# Patient Record
Sex: Female | Born: 1950 | Race: Black or African American | Hispanic: No | Marital: Single | Smoking: Former smoker
Health system: Southern US, Community
[De-identification: ages and names within clinical notes are randomized; demographics above are authoritative.]

## PROBLEM LIST (undated history)

## (undated) DIAGNOSIS — N390 Urinary tract infection, site not specified: Secondary | ICD-10-CM

## (undated) DIAGNOSIS — T7840XA Allergy, unspecified, initial encounter: Secondary | ICD-10-CM

## (undated) DIAGNOSIS — R52 Pain, unspecified: Secondary | ICD-10-CM

## (undated) DIAGNOSIS — R350 Frequency of micturition: Secondary | ICD-10-CM

## (undated) DIAGNOSIS — E785 Hyperlipidemia, unspecified: Secondary | ICD-10-CM

## (undated) DIAGNOSIS — R748 Abnormal levels of other serum enzymes: Secondary | ICD-10-CM

## (undated) DIAGNOSIS — Z0289 Encounter for other administrative examinations: Secondary | ICD-10-CM

## (undated) DIAGNOSIS — R4189 Other symptoms and signs involving cognitive functions and awareness: Secondary | ICD-10-CM

## (undated) DIAGNOSIS — G8929 Other chronic pain: Secondary | ICD-10-CM

## (undated) DIAGNOSIS — E2740 Unspecified adrenocortical insufficiency: Secondary | ICD-10-CM

## (undated) DIAGNOSIS — F319 Bipolar disorder, unspecified: Secondary | ICD-10-CM

## (undated) DIAGNOSIS — M5412 Radiculopathy, cervical region: Secondary | ICD-10-CM

## (undated) DIAGNOSIS — F419 Anxiety disorder, unspecified: Secondary | ICD-10-CM

## (undated) DIAGNOSIS — I10 Essential (primary) hypertension: Secondary | ICD-10-CM

## (undated) DIAGNOSIS — F32A Depression, unspecified: Secondary | ICD-10-CM

## (undated) DIAGNOSIS — M533 Sacrococcygeal disorders, not elsewhere classified: Secondary | ICD-10-CM

## (undated) DIAGNOSIS — K649 Unspecified hemorrhoids: Secondary | ICD-10-CM

## (undated) DIAGNOSIS — E871 Hypo-osmolality and hyponatremia: Secondary | ICD-10-CM

## (undated) DIAGNOSIS — E663 Overweight: Secondary | ICD-10-CM

## (undated) DIAGNOSIS — M545 Low back pain, unspecified: Secondary | ICD-10-CM

## (undated) DIAGNOSIS — G5620 Lesion of ulnar nerve, unspecified upper limb: Secondary | ICD-10-CM

## (undated) DIAGNOSIS — M259 Joint disorder, unspecified: Secondary | ICD-10-CM

## (undated) DIAGNOSIS — K2100 Gastro-esophageal reflux disease with esophagitis, without bleeding: Secondary | ICD-10-CM

## (undated) DIAGNOSIS — M25559 Pain in unspecified hip: Secondary | ICD-10-CM

## (undated) DIAGNOSIS — E039 Hypothyroidism, unspecified: Secondary | ICD-10-CM

## (undated) DIAGNOSIS — M169 Osteoarthritis of hip, unspecified: Secondary | ICD-10-CM

## (undated) DIAGNOSIS — M5416 Radiculopathy, lumbar region: Secondary | ICD-10-CM

## (undated) DIAGNOSIS — F3160 Bipolar disorder, current episode mixed, unspecified: Secondary | ICD-10-CM

## (undated) DIAGNOSIS — Z87442 Personal history of urinary calculi: Secondary | ICD-10-CM

## (undated) DIAGNOSIS — Z96642 Presence of left artificial hip joint: Secondary | ICD-10-CM

## (undated) DIAGNOSIS — M1990 Unspecified osteoarthritis, unspecified site: Secondary | ICD-10-CM

## (undated) DIAGNOSIS — R7303 Prediabetes: Secondary | ICD-10-CM

## (undated) DIAGNOSIS — M5116 Intervertebral disc disorders with radiculopathy, lumbar region: Secondary | ICD-10-CM

## (undated) DIAGNOSIS — K5901 Slow transit constipation: Secondary | ICD-10-CM

## (undated) DIAGNOSIS — B3781 Candidal esophagitis: Secondary | ICD-10-CM

## (undated) DIAGNOSIS — G894 Chronic pain syndrome: Secondary | ICD-10-CM

## (undated) DIAGNOSIS — N952 Postmenopausal atrophic vaginitis: Secondary | ICD-10-CM

## (undated) DIAGNOSIS — F329 Major depressive disorder, single episode, unspecified: Secondary | ICD-10-CM

## (undated) DIAGNOSIS — G723 Periodic paralysis: Secondary | ICD-10-CM

## (undated) DIAGNOSIS — R627 Adult failure to thrive: Secondary | ICD-10-CM

## (undated) DIAGNOSIS — B370 Candidal stomatitis: Secondary | ICD-10-CM

## (undated) DIAGNOSIS — M4800 Spinal stenosis, site unspecified: Secondary | ICD-10-CM

## (undated) DIAGNOSIS — I951 Orthostatic hypotension: Secondary | ICD-10-CM

## (undated) DIAGNOSIS — M7062 Trochanteric bursitis, left hip: Secondary | ICD-10-CM

## (undated) HISTORY — DX: Anxiety disorder, unspecified: F41.9

## (undated) HISTORY — DX: Presence of left artificial hip joint: Z96.642

## (undated) HISTORY — DX: Spinal stenosis, site unspecified: M48.00

## (undated) HISTORY — DX: Frequency of micturition: R35.0

## (undated) HISTORY — DX: Radiculopathy, cervical region: M54.12

## (undated) HISTORY — DX: Hyperlipidemia, unspecified: E78.5

## (undated) HISTORY — DX: Unspecified adrenocortical insufficiency: E27.40

## (undated) HISTORY — DX: Trochanteric bursitis, left hip: M70.62

## (undated) HISTORY — DX: Pain in unspecified hip: M25.559

## (undated) HISTORY — DX: Encounter for other administrative examinations: Z02.89

## (undated) HISTORY — DX: Candidal stomatitis: B37.0

## (undated) HISTORY — DX: Adult failure to thrive: R62.7

## (undated) HISTORY — DX: Lesion of ulnar nerve, unspecified upper limb: G56.20

## (undated) HISTORY — PX: OTHER SURGICAL HISTORY: SHX169

## (undated) HISTORY — DX: Essential (primary) hypertension: I10

## (undated) HISTORY — DX: Bipolar disorder, unspecified: F31.9

## (undated) HISTORY — DX: Depression, unspecified: F32.A

## (undated) HISTORY — DX: Unspecified hemorrhoids: K64.9

## (undated) HISTORY — DX: Postmenopausal atrophic vaginitis: N95.2

## (undated) HISTORY — DX: Periodic paralysis: G72.3

## (undated) HISTORY — DX: Joint disorder, unspecified: M25.9

## (undated) HISTORY — DX: Unspecified osteoarthritis, unspecified site: M19.90

## (undated) HISTORY — DX: Personal history of urinary calculi: Z87.442

## (undated) HISTORY — DX: Orthostatic hypotension: I95.1

## (undated) HISTORY — DX: Urinary tract infection, site not specified: N39.0

## (undated) HISTORY — DX: Gastro-esophageal reflux disease with esophagitis, without bleeding: K21.00

## (undated) HISTORY — DX: Low back pain, unspecified: M54.50

## (undated) HISTORY — DX: Abnormal levels of other serum enzymes: R74.8

## (undated) HISTORY — DX: Hypothyroidism, unspecified: E03.9

## (undated) HISTORY — DX: Osteoarthritis of hip, unspecified: M16.9

## (undated) HISTORY — DX: Other chronic pain: G89.29

## (undated) HISTORY — DX: Radiculopathy, lumbar region: M54.16

## (undated) HISTORY — DX: Overweight: E66.3

## (undated) HISTORY — DX: Candidal esophagitis: B37.81

## (undated) HISTORY — DX: Pain, unspecified: R52

## (undated) HISTORY — DX: Other symptoms and signs involving cognitive functions and awareness: R41.89

## (undated) HISTORY — DX: Allergy, unspecified, initial encounter: T78.40XA

## (undated) HISTORY — DX: Bipolar disorder, current episode mixed, unspecified: F31.60

## (undated) HISTORY — DX: Prediabetes: R73.03

## (undated) HISTORY — DX: Sacrococcygeal disorders, not elsewhere classified: M53.3

## (undated) HISTORY — DX: Slow transit constipation: K59.01

## (undated) HISTORY — DX: Chronic pain syndrome: G89.4

## (undated) HISTORY — DX: Major depressive disorder, single episode, unspecified: F32.9

## (undated) HISTORY — PX: ABDOMINAL HYSTERECTOMY: SHX81

## (undated) HISTORY — DX: Intervertebral disc disorders with radiculopathy, lumbar region: M51.16

## (undated) HISTORY — DX: Hypo-osmolality and hyponatremia: E87.1

## (undated) HISTORY — DX: Low back pain: M54.5

## (undated) HISTORY — DX: Gastro-esophageal reflux disease with esophagitis: K21.0

---

## 2004-11-05 ENCOUNTER — Ambulatory Visit: Payer: Self-pay | Admitting: Family Medicine

## 2005-12-22 ENCOUNTER — Ambulatory Visit: Payer: Self-pay | Admitting: Family Medicine

## 2005-12-29 ENCOUNTER — Ambulatory Visit: Payer: Self-pay | Admitting: Family Medicine

## 2006-01-12 ENCOUNTER — Ambulatory Visit: Payer: Self-pay | Admitting: Gastroenterology

## 2006-07-13 ENCOUNTER — Ambulatory Visit: Payer: Self-pay | Admitting: Family Medicine

## 2006-12-29 ENCOUNTER — Ambulatory Visit: Payer: Self-pay | Admitting: Family Medicine

## 2007-01-20 ENCOUNTER — Ambulatory Visit: Payer: Self-pay | Admitting: Family Medicine

## 2007-07-28 ENCOUNTER — Ambulatory Visit: Payer: Self-pay | Admitting: Family Medicine

## 2008-01-25 ENCOUNTER — Ambulatory Visit: Payer: Self-pay | Admitting: Family Medicine

## 2009-01-25 ENCOUNTER — Ambulatory Visit: Payer: Self-pay | Admitting: Internal Medicine

## 2009-07-03 ENCOUNTER — Ambulatory Visit: Payer: Self-pay | Admitting: Internal Medicine

## 2010-03-01 ENCOUNTER — Ambulatory Visit: Payer: Self-pay | Admitting: Internal Medicine

## 2011-05-07 ENCOUNTER — Ambulatory Visit: Payer: Self-pay | Admitting: Internal Medicine

## 2011-05-09 ENCOUNTER — Ambulatory Visit: Payer: Self-pay | Admitting: Internal Medicine

## 2012-05-10 ENCOUNTER — Ambulatory Visit: Payer: Self-pay | Admitting: Internal Medicine

## 2012-10-13 ENCOUNTER — Ambulatory Visit: Payer: Self-pay | Admitting: Unknown Physician Specialty

## 2013-06-06 DIAGNOSIS — E2740 Unspecified adrenocortical insufficiency: Secondary | ICD-10-CM

## 2013-06-06 DIAGNOSIS — F3160 Bipolar disorder, current episode mixed, unspecified: Secondary | ICD-10-CM

## 2013-06-06 DIAGNOSIS — G723 Periodic paralysis: Secondary | ICD-10-CM | POA: Insufficient documentation

## 2013-06-06 DIAGNOSIS — N390 Urinary tract infection, site not specified: Secondary | ICD-10-CM | POA: Insufficient documentation

## 2013-06-06 DIAGNOSIS — R748 Abnormal levels of other serum enzymes: Secondary | ICD-10-CM | POA: Insufficient documentation

## 2013-06-06 DIAGNOSIS — E871 Hypo-osmolality and hyponatremia: Secondary | ICD-10-CM

## 2013-06-06 HISTORY — DX: Unspecified adrenocortical insufficiency: E27.40

## 2013-06-06 HISTORY — DX: Periodic paralysis: G72.3

## 2013-06-06 HISTORY — DX: Hypo-osmolality and hyponatremia: E87.1

## 2013-06-06 HISTORY — DX: Urinary tract infection, site not specified: N39.0

## 2013-06-06 HISTORY — DX: Bipolar disorder, current episode mixed, unspecified: F31.60

## 2013-07-22 ENCOUNTER — Ambulatory Visit: Payer: Self-pay | Admitting: Internal Medicine

## 2013-11-30 ENCOUNTER — Ambulatory Visit: Payer: Self-pay

## 2014-01-04 ENCOUNTER — Ambulatory Visit: Payer: Self-pay | Admitting: Rheumatology

## 2014-01-23 ENCOUNTER — Ambulatory Visit: Payer: Self-pay | Admitting: Unknown Physician Specialty

## 2014-01-23 LAB — BASIC METABOLIC PANEL
Anion Gap: 5 — ABNORMAL LOW (ref 7–16)
BUN: 9 mg/dL (ref 7–18)
CREATININE: 0.89 mg/dL (ref 0.60–1.30)
Calcium, Total: 9.2 mg/dL (ref 8.5–10.1)
Chloride: 101 mmol/L (ref 98–107)
Co2: 29 mmol/L (ref 21–32)
EGFR (Non-African Amer.): >60
Glucose: 104 mg/dL — ABNORMAL HIGH (ref 65–99)
OSMOLALITY: 269 (ref 275–301)
POTASSIUM: 4.5 mmol/L (ref 3.5–5.1)
SODIUM: 135 mmol/L — AB (ref 136–145)

## 2014-01-23 LAB — HEMOGLOBIN: HGB: 12.4 g/dL (ref 12.0–16.0)

## 2014-01-24 ENCOUNTER — Ambulatory Visit: Payer: Self-pay | Admitting: Orthopedic Surgery

## 2014-02-23 ENCOUNTER — Inpatient Hospital Stay: Payer: Self-pay | Admitting: Internal Medicine

## 2014-02-23 LAB — URINALYSIS, COMPLETE
BACTERIA: NONE SEEN
Bilirubin,UR: NEGATIVE
Blood: NEGATIVE
GLUCOSE, UR: NEGATIVE mg/dL (ref 0–75)
Ketone: NEGATIVE
Leukocyte Esterase: NEGATIVE
NITRITE: NEGATIVE
PROTEIN: NEGATIVE
Ph: 6 (ref 4.5–8.0)
RBC,UR: 1 /HPF (ref 0–5)
Specific Gravity: 1.015 (ref 1.003–1.030)
Squamous Epithelial: NONE SEEN

## 2014-02-23 LAB — BASIC METABOLIC PANEL
Anion Gap: 7 (ref 7–16)
BUN: 4 mg/dL — AB (ref 7–18)
CREATININE: 0.78 mg/dL (ref 0.60–1.30)
Calcium, Total: 10.4 mg/dL — ABNORMAL HIGH (ref 8.5–10.1)
Chloride: 103 mmol/L (ref 98–107)
Co2: 30 mmol/L (ref 21–32)
GLUCOSE: 95 mg/dL (ref 65–99)
Osmolality: 276 (ref 275–301)
Potassium: 4.5 mmol/L (ref 3.5–5.1)
SODIUM: 140 mmol/L (ref 136–145)

## 2014-02-23 LAB — CBC WITH DIFFERENTIAL/PLATELET
Basophil #: 0.1 10*3/uL (ref 0.0–0.1)
Basophil %: 0.8 %
EOS ABS: 0 10*3/uL (ref 0.0–0.7)
Eosinophil %: 0.5 %
HCT: 43.9 % (ref 35.0–47.0)
HGB: 13.9 g/dL (ref 12.0–16.0)
LYMPHS ABS: 1.8 10*3/uL (ref 1.0–3.6)
Lymphocyte %: 17.4 %
MCH: 28.8 pg (ref 26.0–34.0)
MCHC: 31.6 g/dL — AB (ref 32.0–36.0)
MCV: 91 fL (ref 80–100)
Monocyte #: 0.9 x10 3/mm (ref 0.2–0.9)
Monocyte %: 9.1 %
NEUTROS ABS: 7.3 10*3/uL — AB (ref 1.4–6.5)
Neutrophil %: 72.2 %
Platelet: 210 10*3/uL (ref 150–440)
RBC: 4.81 10*6/uL (ref 3.80–5.20)
RDW: 14.9 % — AB (ref 11.5–14.5)
WBC: 10.2 10*3/uL (ref 3.6–11.0)

## 2014-02-23 LAB — LIPID PANEL
Cholesterol: 152 mg/dL (ref 0–200)
HDL: 42 mg/dL (ref 40–60)
LDL CHOLESTEROL, CALC: 76 mg/dL (ref 0–100)
Triglycerides: 169 mg/dL (ref 0–200)
VLDL CHOLESTEROL, CALC: 34 mg/dL (ref 5–40)

## 2014-02-23 LAB — TROPONIN I: Troponin-I: <0.02 ng/mL

## 2014-02-24 DIAGNOSIS — I639 Cerebral infarction, unspecified: Secondary | ICD-10-CM | POA: Diagnosis not present

## 2014-02-24 DIAGNOSIS — I10 Essential (primary) hypertension: Secondary | ICD-10-CM | POA: Diagnosis not present

## 2014-02-24 LAB — BASIC METABOLIC PANEL
Anion Gap: 5 — ABNORMAL LOW (ref 7–16)
BUN: 5 mg/dL — ABNORMAL LOW (ref 7–18)
Calcium, Total: 8.9 mg/dL (ref 8.5–10.1)
Chloride: 107 mmol/L (ref 98–107)
Co2: 30 mmol/L (ref 21–32)
Creatinine: 0.75 mg/dL (ref 0.60–1.30)
EGFR (African American): >60
EGFR (Non-African Amer.): >60
Glucose: 103 mg/dL — ABNORMAL HIGH (ref 65–99)
Osmolality: 281 (ref 275–301)
Potassium: 3.7 mmol/L (ref 3.5–5.1)
Sodium: 142 mmol/L (ref 136–145)

## 2014-02-24 LAB — CBC WITH DIFFERENTIAL/PLATELET
BASOS PCT: 0.4 %
Basophil #: 0 10*3/uL (ref 0.0–0.1)
Eosinophil #: 0.1 10*3/uL (ref 0.0–0.7)
Eosinophil %: 1.1 %
HCT: 38 % (ref 35.0–47.0)
HGB: 12.2 g/dL (ref 12.0–16.0)
LYMPHS ABS: 2.5 10*3/uL (ref 1.0–3.6)
Lymphocyte %: 37.2 %
MCH: 29.2 pg (ref 26.0–34.0)
MCHC: 32 g/dL (ref 32.0–36.0)
MCV: 91 fL (ref 80–100)
Monocyte #: 0.7 x10 3/mm (ref 0.2–0.9)
Monocyte %: 10.3 %
NEUTROS ABS: 3.4 10*3/uL (ref 1.4–6.5)
Neutrophil %: 51 %
Platelet: 194 10*3/uL (ref 150–440)
RBC: 4.17 10*6/uL (ref 3.80–5.20)
RDW: 15 % — AB (ref 11.5–14.5)
WBC: 6.6 10*3/uL (ref 3.6–11.0)

## 2014-05-27 NOTE — Op Note (Signed)
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   PROCEDURE: Left ulnar nerve subcutaneous transposition.   ANESTHESIA: General.   SURGEON: Leitha SchullerMichael J Vallen Calabrese, MD   DESCRIPTION OF PROCEDURE: The patient was brought to the operating room and after adequate anesthesia was obtained, the left arm was prepped and draped in the usual sterile fashion. Sterile tourniquet was utilized. After patient identification and timeout procedures were completed, the tourniquet was raised to 250 mmHg. A curvilinear incision was made centered over the medial epicondyle. The subcutaneous tissue was spread and the cubital tunnel identified. It was opened just proximal to the medial epicondyle and release was carried out distally between the heads of the FCU. Going proximally release was carried out and the intermuscular septum excised for transposition of the nerve. After freeing up the nerve it could be transposed anteriorly. The nerve was noted to be somewhat atrophied and pale in appearance without one focal area of compression. After removing it anteriorly, a fascial flap was elevated off the common flexor origin and this was sutured to the subcutaneous tissue to hold the nerve anteriorly, placed through a range of motion, there did not appear to be any points of compression on the nerve to direct visualization and palpation.   The wound was closed after irrigation with 2-0 Vicryl subcutaneously and 4-0 nylon for the skin. Xeroform, 4 x 4's, Webril and posterior splint was applied followed by an Ace wrap.   The patient tolerated the procedure well and was sent to the recovery room in stable condition.   ESTIMATED BLOOD LOSS: Minimal.   COMPLICATIONS: None.   SPECIMEN: None.   TOURNIQUET TIME:  Was 28 minutes at  250 mmHg ligaments.    ____________________________ Leitha SchullerMichael J. Marvalene Barrett, MD mjm:nt D: 01/24/2014 20:32:07 ET T: 01/24/2014 21:49:43 ET JOB#: [id number]  cc: Leitha SchullerMichael J. Cyrene Gharibian, MD, <Dictator> Leitha SchullerMICHAEL J Kaelani Kendrick MD ELECTRONICALLY SIGNED 01/25/2014 8:17

## 2014-05-29 DIAGNOSIS — M5416 Radiculopathy, lumbar region: Secondary | ICD-10-CM | POA: Insufficient documentation

## 2014-05-29 DIAGNOSIS — M5116 Intervertebral disc disorders with radiculopathy, lumbar region: Secondary | ICD-10-CM

## 2014-05-29 HISTORY — DX: Intervertebral disc disorders with radiculopathy, lumbar region: M51.16

## 2014-06-04 NOTE — Discharge Summary (Signed)
PATIENT NAME:  Kelly Foster, Kelly Foster MR#:  [id number] DATE OF BIRTH:  22-Sep-1950  ADMITTING PHYSICIAN: Enid Baas, MD   PRIMARY CARE PHYSICIAN: Hyman Hopes, MD  CONSULTATIONS IN THE HOSPITAL: None.   DISCHARGING PHYSICIAN:   Enid Baas, MD   DISCHARGE DIAGNOSES: 1.  Transient ischemic attack with left-sided weakness, resolved.  MRI negative. Mild right facial droop persistent.  2.  Bipolar with depression, anxiety.  3.  Hypertension.  4.  Hyperlipidemia.   DISCHARGE HOME MEDICATIONS:  1.  Gabapentin 300 mg p.o. 3 times a day.  2.  Oxybutynin 5 mg p.o. daily.  3.  Triamcinolone ointment to irritated gums 2-3 times a day.  4.  Risperidone 4 mg p.o. daily.  5.  Sertraline 200 mg p.o. daily.  6.  Tylenol 1000 mg p.o. q. 8 hours p.r.n. for pain.  7.  Proctosol hydrocortisone 2.5% rectal cream apply to rectal area 2-4 times a day.  8.  Trazodone 100 mg at bedtime as needed.  9.  Trazodone 50 mg 1-2 tablets at bedtime.  10.  Calcium carbonate 600 mg p.o. b.i.d.  11.  Dulcolax tablet 10 mg daily at bedtime.  12.  Systane 1 drop each eye once a day.  13.  Pataday 0.2% ophthalmic solution 1 drop each eye once a day.  14.  Depakote 1000 mg once a day at bedtime. 15.  Cetirizine 10 mg p.o. daily.  16.  Multivitamin 1 tablet p.o. daily.  17.  Simvastatin 40 mg p.o. daily.  18.  Nystatin oral suspension 5 mL 4 times a day.  19.  Naproxen 500 mg p.o. b.i.d.  20.  Nasonex 50 mcg 2 sprays each nostril once a day.  21.  Lisinopril 10 mg p.o. daily.  22.  Aspirin 325 mg p.o. daily.   DISCHARGE DIET: Low-sodium diet.   DISCHARGE ACTIVITY: As tolerated.   FOLLOWUP INSTRUCTIONS: 1.  PCP followup in 2 weeks.  2.  Home health physical therapy and nursing.  LABORATORY DATA AND IMAGING STUDIES:  Prior to discharge, WBC 6.6, hemoglobin 12.2, hematocrit 38.0, platelet count 194,000. Sodium 142, potassium 3.7, chloride 107, bicarbonate 30, BUN 5, creatinine 0.75, glucose 103, and  calcium of 8.9. LDL cholesterol 76, HDL 42, total cholesterol 045, triglycerides 169. Urinalysis negative for any infection. CT of the head on admission showing no acute intracranial abnormality, mild generalized atrophy noted.   Ultrasound of carotids bilaterally showing no evidence of any hemodynamically significant stenosis. MRA of the brain showing no acute intracranial abnormalities. Mild cerebral atrophy present. Echocardiogram Doppler showing LV ejection fraction of 60%-65%, impaired relaxation pattern.  No source of CVA identified.   BRIEF HOSPITAL COURSE: Kelly Foster is a 64 year old African American female with history of bipolar disorder, hypertension, hyperlipidemia, who presents to the hospital secondary to left-sided weakness, unable to pull herself out of bed and also right-sided facial droop.  1.  Possible acute CVA. Initially admitted for possible acute CVA. She came outside of the TPA window, was admitted to telemetry. Neurologic checks were done. Her weakness has improved significantly. No evidence of any infection, not sure what exactly the cause of her left-sided weakness could have been. She has risk factors for possible TIA. MRI is negative, Dopplers are negative of the carotid arteries and echocardiogram is normal. She still has very minimal  right facial droop, not sure how much of this is chronic.  No evidence of Bell palsy otherwise.  She was not taking antiplatelet therapy at home, so  was started on aspirin. Her statin is being continued. She was evaluated by physical therapy, initially recommended rehabilitation. The patient refused rehabilitation, but she has gotten stronger so wanted to go home, so she is being discharged home with home health at this time.  2.  Blood pressure. Her lisinopril dose has been increased.  3.  Bipolar disorder. All her home medications are being continued without any changes.  4.  Arthritis. She is on pain medications and also gabapentin.  5.  Her  course has been otherwise uneventful in the hospital.   DISCHARGE CONDITION: Stable.   DISCHARGE DISPOSITION: Home with home health.   TIME SPENT ON DISCHARGE: 40 minutes.     ____________________________ Enid Baasadhika Erionna Strum, MD rk:LT D: 02/26/2014 13:34:21 ET T: 02/26/2014 21:02:32 ET JOB#: [id number]  cc: Enid Baasadhika Hiawatha Dressel, MD, <Dictator> Hyman HopesHarriett P. Burns, MD Enid BaasADHIKA Marbeth Smedley MD ELECTRONICALLY SIGNED 02/27/2014 14:00

## 2014-06-04 NOTE — H&P (Signed)
PATIENT NAME:  Kelly Foster, Kelly Foster MR#:  [id number] DATE OF BIRTH:  [birth year]  DATE OF ADMISSION:  02/23/2014  ADMITTING PHYSICIAN: Enid Baas, MD   PRIMARY CARE PHYSICIAN: Myrene Galas, MD  CHIEF COMPLAINT: Weakness.   HISTORY OF PRESENT ILLNESS: Ms. [place]ber[place] is a 64 year old African American female with past medical history significant for hypertension, bipolar disorder, hyperlipidemia and chronic anemia who presents to the hospital secondary to difficulty walking since this morning. The patient said she is usually independent, takes care of herself, was in her normal state of health all day yesterday. This morning she woke up and she could not get out of bed. She could not move and called EMS and presents to the ER. She could not specify what is wrong with her exactly. On exam, it seems like she has minimal weakness on the left side. The patient is a right-handed person and is noted to have obvious minimal right facial droop. Denies any fevers or chills. No chest pain. No headaches. No previous strokes.   PAST MEDICAL HISTORY:  1.  Bipolar with depression and anxiety.  2.  Hypertension.  3.  Hyperlipidemia.  4.  Allergies.  PAST SURGICAL HISTORY:  1.  Hysterectomy.  2.  C-section.   ALLERGIES TO MEDICATIONS: CODEINE AND TRAMADOL.  CURRENT HOME MEDICATIONS:  1.  Triamcinolone ointment to irritated gum for 5 days as needed.  2.  Risperidone 4 mg oral tablet daily.  3.  Sertraline 100 mg 2 tablets daily.  4.  Tylenol 1000 mg every 8 hours as needed for pain.  5.  Diclofenac sodium 50 mg b.i.d. p.r.n. for pain.  6.  Proctosol 2.5% ointment to hemorrhoids 2 to 4 times a day.  7.  Lisinopril 5 mg p.o. daily.  8.  Trazodone 100 mg p.o. at bedtime.  9.  Trazodone 100 mg extra as needed at bedtime.  10.  Calcium 600 mg tablet twice a day.  11.  Dulcolax 5 mg 2 tablets by mouth at bedtime to help with constipation.  12.  Systane ophthalmic solution 1 drop each eye once a day for dry  eyes. 13.  Pataday 0.2% ophthalmic solution 1 drop each eye once a day for allergy symptoms.  14.  Depakote 1000 mg p.o. at bedtime.  15.  Simvastatin 40 mg p.o. daily.  16.  Cetirizine 10 mg p.o. daily.  17.  Multivitamin 1 tablet p.o. daily.   SOCIAL HISTORY: Lives at home by herself. Quit smoking and alcohol for about 20 years. 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   FAMILY HISTORY: Father had a stroke. Sister with colon cancer.   REVIEW OF SYSTEMS: CONSTITUTIONAL: No fever, fatigue.  EYES: No blurred vision, double vision, inflammation or glaucoma. Uses reading glasses.  ENT: No tinnitus, ear pain, hearing loss, epistaxis or discharge. Positive for dysphagia. Unable to swallow liquids and has been coughing since morning.  RESPIRATORY: No cough, wheeze, hemoptysis or COPD. CARDIOVASCULAR: No chest pain, orthopnea, edema, arrhythmia, palpitations, or syncope.  GASTROINTESTINAL: No nausea, vomiting, diarrhea, abdominal pain, hematemesis, or melena.  GENITOURINARY: No dysuria, hematuria, renal calculus, frequency, or incontinence.  ENDOCRINE: No polyuria, nocturia, thyroid problems, heat or cold intolerance.  HEMATOLOGY: No anemia, easy bruising or bleeding.  SKIN: No acne, rash or lesions.  MUSCULOSKELETAL: Positive for left-sided weakness, generalized weakness.  NEUROLOGIC: No previous CVA, TIA or seizures.  PSYCHOLOGIC: Positive for history of bipolar disorder. No active anxiety or insomnia.   PHYSICAL  EXAMINATION: VITAL SIGNS: Temperature 98.2 degrees Fahrenheit, pulse 104, respirations 18, blood pressure 113/69, pulse ox 94% on room air.  GENERAL: Well-built, well-nourished female lying in bed, not in any acute distress.  HEENT: Normocephalic, atraumatic. Pupils equal, round and reacting to light. Anicteric sclerae. Extraocular movements intact. Oropharynx clear without erythema, mass, or exudates. Mild right facial droop, especially at the  mouth region noted and there is erythematous patch about the right upper lip until starting right at the mid left upper lip to the lateral end of the lip.  NECK: Supple. No thyromegaly, JVD or carotid bruit. No lymphadenopathy.  LUNGS: Moving air bilaterally. No wheeze or crackles. No use of accessory muscles for breathing.  CARDIOVASCULAR: S1 and S2, regular rate and rhythm. No murmurs, rubs, or gallops.  ABDOMEN: Soft, nontender, nondistended. No hepatosplenomegaly. Normal bowel sounds.  EXTREMITIES: No pedal edema. No clubbing. No cyanosis. 2+ dorsalis pedis pulses palpable bilaterally.  SKIN: No acne, rash or lesions.  LYMPHATICS: No cervical lymphadenopathy.  NEUROLOGIC: Small mouth droop noted in the right side of the face. Otherwise, cranial nerves intact. Strength is 5/5 in both right upper and lower extremities, on the left side 4/5 left upper extremity and 4/5 left lower extremity with some drift noted. Unstable cerebellar function test on the right side, especially the finger-nose test. Gait cannot be tested as the patient feels unsafe on sitting up at this time and cannot pull herself up.  PSYCHOLOGIC: The patient is awake, alert, oriented x3.   DIAGNOSTIC DATA: WBC 10.2, hemoglobin 13.9, hematocrit 43.9, platelet count 210,000.   Sodium 140, potassium 4.5, chloride 103, bicarb 30, BUN 4, creatinine 0.78, glucose 95, and calcium 10.4. Troponin is negative. Urinalysis is negative for any infection.   CT of the head showing atrophy, mild, and no acute intracranial abnormalities.  EKG showing sinus tachycardia, heart rate of 101. No acute ST-T wave abnormalities.   ASSESSMENT AND PLAN: A 64 year old female with hypertension, hyperlipidemia, bipolar disorder admitted for weakness. CT is negative for any acute findings.  1.  Possible acute cerebrovascular accident with right facial droop, left-sided weakness. The symptoms started this morning. However, past the TPA window with her symptoms  noticed at 7:00 a.m. Admit, get MRI, carotid Dopplers, echocardiogram, start aspirin, continue statin and neuro checks have been ordered. Since the patient is complaining of dysphagia, will also get a swallow evaluation.  PT and OT consult and care management consult.  2.  Hypertension. Permissive high blood pressure. Blood pressure is anyways on the lower side. Hold her home p.o. medicines and IV hydralazine p.r.n.  3.  Bipolar. Continue home medications.  4.  Deep vein thrombosis prophylaxis.  CODE STATUS: FULL code.   TIME SPENT ON ADMISSION: 50 minutes.  ____________________________ Enid Baasadhika Dawana Asper, MD rk:sb D: 02/23/2014 15:14:29 ET T: 02/23/2014 15:29:30 ET JOB#: [id number]  cc: Enid Baasadhika Micheline Markes, MD, <Dictator> Hyman HopesHarriett P. Burns, MD Enid BaasADHIKA Laylaa Guevarra MD ELECTRONICALLY SIGNED 02/26/2014 14:12

## 2014-06-29 ENCOUNTER — Ambulatory Visit: Payer: Medicare Other | Attending: Pain Medicine | Admitting: Pain Medicine

## 2014-06-29 ENCOUNTER — Encounter: Payer: Self-pay | Admitting: Pain Medicine

## 2014-06-29 VITALS — BP 133/53 | HR 79 | Temp 98.1°F | Resp 16 | Ht 67.0 in | Wt 214.0 lb

## 2014-06-29 DIAGNOSIS — G90512 Complex regional pain syndrome I of left upper limb: Secondary | ICD-10-CM

## 2014-06-29 DIAGNOSIS — M792 Neuralgia and neuritis, unspecified: Secondary | ICD-10-CM

## 2014-06-29 DIAGNOSIS — M5030 Other cervical disc degeneration, unspecified cervical region: Secondary | ICD-10-CM

## 2014-06-29 NOTE — Progress Notes (Signed)
Discharged to home ambulatory at 1346.  Pre procedure instructions given with teach back 3 done.

## 2014-06-29 NOTE — Patient Instructions (Addendum)
GENERAL RISKS AND COMPLICATIONS  What are the risk, side effects and possible complications? Generally speaking, most procedures are safe.  However, with any procedure there are risks, side effects, and the possibility of complications.  The risks and complications are dependent upon the sites that are lesioned, or the type of nerve block to be performed.  The closer the procedure is to the spine, the more serious the risks are.  Great care is taken when placing the radio frequency needles, block needles or lesioning probes, but sometimes complications can occur. 1. Infection: Any time there is an injection through the skin, there is a risk of infection.  This is why sterile conditions are used for these blocks.  There are four possible types of infection. 1. Localized skin infection. 2. Central Nervous System Infection-This can be in the form of Meningitis, which can be deadly. 3. Epidural Infections-This can be in the form of an epidural abscess, which can cause pressure inside of the spine, causing compression of the spinal cord with subsequent paralysis. This would require an emergency surgery to decompress, and there are no guarantees that the patient would recover from the paralysis. 4. Discitis-This is an infection of the intervertebral discs.  It occurs in about 1% of discography procedures.  It is difficult to treat and it may lead to surgery.        2. Pain: the needles have to go through skin and soft tissues, will cause soreness.       3. Damage to internal structures:  The nerves to be lesioned may be near blood vessels or    other nerves which can be potentially damaged.       4. Bleeding: Bleeding is more common if the patient is taking blood thinners such as  aspirin, Coumadin, Ticiid, Plavix, etc., or if he/she have some genetic predisposition  such as hemophilia. Bleeding into the spinal canal can cause compression of the spinal  cord with subsequent paralysis.  This would require an  emergency surgery to  decompress and there are no guarantees that the patient would recover from the  paralysis.       5. Pneumothorax:  Puncturing of a lung is a possibility, every time a needle is introduced in  the area of the chest or upper back.  Pneumothorax refers to free air around the  collapsed lung(s), inside of the thoracic cavity (chest cavity).  Another two possible  complications related to a similar event would include: Hemothorax and Chylothorax.   These are variations of the Pneumothorax, where instead of air around the collapsed  lung(s), you may have blood or chyle, respectively.       6. Spinal headaches: They may occur with any procedures in the area of the spine.       7. Persistent CSF (Cerebro-Spinal Fluid) leakage: This is a rare problem, but may occur  with prolonged intrathecal or epidural catheters either due to the formation of a fistulous  track or a dural tear.       8. Nerve damage: By working so close to the spinal cord, there is always a possibility of  nerve damage, which could be as serious as a permanent spinal cord injury with  paralysis.       9. Death:  Although rare, severe deadly allergic reactions known as "Anaphylactic  reaction" can occur to any of the medications used.      10. Worsening of the symptoms:  We can always make thing worse.    What are the chances of something like this happening? Chances of any of this occuring are extremely low.  By statistics, you have more of a chance of getting killed in a motor vehicle accident: while driving to the hospital than any of the above occurring .  Nevertheless, you should be aware that they are possibilities.  In general, it is similar to taking a shower.  Everybody knows that you can slip, hit your head and get killed.  Does that mean that you should not shower again?  Nevertheless always keep in mind that statistics do not mean anything if you happen to be on the wrong side of them.  Even if a procedure has a 1  (one) in a 1,000,000 (million) chance of going wrong, it you happen to be that one..Also, keep in mind that by statistics, you have more of a chance of having something go wrong when taking medications.  Who should not have this procedure? If you are on a blood thinning medication (e.g. Coumadin, Plavix, see list of "Blood Thinners"), or if you have an active infection going on, you should not have the procedure.  If you are taking any blood thinners, please inform your physician.  How should I prepare for this procedure?  Do not eat or drink anything at least six hours prior to the procedure.  Bring a driver with you .  It cannot be a taxi.  Come accompanied by an adult that can drive you back, and that is strong enough to help you if your legs get weak or numb from the local anesthetic.  Take all of your medicines the morning of the procedure with just enough water to swallow them.  If you have diabetes, make sure that you are scheduled to have your procedure done first thing in the morning, whenever possible.  If you have diabetes, take only half of your insulin dose and notify our nurse that you have done so as soon as you arrive at the clinic.  If you are diabetic, but only take blood sugar pills (oral hypoglycemic), then do not take them on the morning of your procedure.  You may take them after you have had the procedure.  Do not take aspirin or any aspirin-containing medications, at least eleven (11) days prior to the procedure.  They may prolong bleeding.  Wear loose fitting clothing that may be easy to take off and that you would not mind if it got stained with Betadine or blood.  Do not wear any jewelry or perfume  Remove any nail coloring.  It will interfere with some of our monitoring equipment.  NOTE: Remember that this is not meant to be interpreted as a complete list of all possible complications.  Unforeseen problems may occur.  BLOOD THINNERS The following drugs  contain aspirin or other products, which can cause increased bleeding during surgery and should not be taken for 2 weeks prior to and 1 week after surgery.  If you should need take something for relief of minor pain, you may take acetaminophen which is found in Tylenol,m Datril, Anacin-3 and Panadol. It is not blood thinner. The products listed below are.  Do not take any of the products listed below in addition to any listed on your instruction sheet.  A.P.C or A.P.C with Codeine Codeine Phosphate Capsules #3 Ibuprofen Ridaura  ABC compound Congesprin Imuran rimadil  Advil Cope Indocin Robaxisal  Alka-Seltzer Effervescent Pain Reliever and Antacid Coricidin or Coricidin-D  Indomethacin Rufen    Alka-Seltzer plus Cold Medicine Cosprin Ketoprofen S-A-C Tablets  Anacin Analgesic Tablets or Capsules Coumadin Korlgesic Salflex  Anacin Extra Strength Analgesic tablets or capsules CP-2 Tablets Lanoril Salicylate  Anaprox Cuprimine Capsules Levenox Salocol  Anexsia-D Dalteparin Magan Salsalate  Anodynos Darvon compound Magnesium Salicylate Sine-off  Ansaid Dasin Capsules Magsal Sodium Salicylate  Anturane Depen Capsules Marnal Soma  APF Arthritis pain formula Dewitt's Pills Measurin Stanback  Argesic Dia-Gesic Meclofenamic Sulfinpyrazone  Arthritis Bayer Timed Release Aspirin Diclofenac Meclomen Sulindac  Arthritis pain formula Anacin Dicumarol Medipren Supac  Analgesic (Safety coated) Arthralgen Diffunasal Mefanamic Suprofen  Arthritis Strength Bufferin Dihydrocodeine Mepro Compound Suprol  Arthropan liquid Dopirydamole Methcarbomol with Aspirin Synalgos  ASA tablets/Enseals Disalcid Micrainin Tagament  Ascriptin Doan's Midol Talwin  Ascriptin A/D Dolene Mobidin Tanderil  Ascriptin Extra Strength Dolobid Moblgesic Ticlid  Ascriptin with Codeine Doloprin or Doloprin with Codeine Momentum Tolectin  Asperbuf Duoprin Mono-gesic Trendar  Aspergum Duradyne Motrin or Motrin IB Triminicin  Aspirin  plain, buffered or enteric coated Durasal Myochrisine Trigesic  Aspirin Suppositories Easprin Nalfon Trillsate  Aspirin with Codeine Ecotrin Regular or Extra Strength Naprosyn Uracel  Atromid-S Efficin Naproxen Ursinus  Auranofin Capsules Elmiron Neocylate Vanquish  Axotal Emagrin Norgesic Verin  Azathioprine Empirin or Empirin with Codeine Normiflo Vitamin E  Azolid Emprazil Nuprin Voltaren  Bayer Aspirin plain, buffered or children's or timed BC Tablets or powders Encaprin Orgaran Warfarin Sodium  Buff-a-Comp Enoxaparin Orudis Zorpin  Buff-a-Comp with Codeine Equegesic Os-Cal-Gesic   Buffaprin Excedrin plain, buffered or Extra Strength Oxalid   Bufferin Arthritis Strength Feldene Oxphenbutazone   Bufferin plain or Extra Strength Feldene Capsules Oxycodone with Aspirin   Bufferin with Codeine Fenoprofen Fenoprofen Pabalate or Pabalate-SF   Buffets II Flogesic Panagesic   Buffinol plain or Extra Strength Florinal or Florinal with Codeine Panwarfarin   Buf-Tabs Flurbiprofen Penicillamine   Butalbital Compound Four-way cold tablets Penicillin   Butazolidin Fragmin Pepto-Bismol   Carbenicillin Geminisyn Percodan   Carna Arthritis Reliever Geopen Persantine   Carprofen Gold's salt Persistin   Chloramphenicol Goody's Phenylbutazone   Chloromycetin Haltrain Piroxlcam   Clmetidine heparin Plaquenil   Cllnoril Hyco-pap Ponstel   Clofibrate Hydroxy chloroquine Propoxyphen         Before stopping any of these medications, be sure to consult the physician who ordered them.  Some, such as Coumadin (Warfarin) are ordered to prevent or treat serious conditions such as "deep thrombosis", "pumonary embolisms", and other heart problems.  The amount of time that you may need off of the medication may also vary with the medication and the reason for which you were taking it.  If you are taking any of these medications, please make sure you notify your pain physician before you undergo any  procedures.         Stellate Ganglion Block Patient Information  Description: The stellate ganglion is part of a chain of nerves in the neck that innervate the shoulder, arm, and hand.  This chain of nerves lies beside the windpipe.  By injecting local anesthesia along the stellate ganglion, we block the nerve innervation to the arm and hand on that particular side.  Generally only sensory innervation (touch) is blocked and not motor innervation (strength).  This allows pain-free physical therapy, which is vital to many of the conditions that this nerve block helps.   After numbing the skin with local anesthesia, a small needle is passed to the stellate ganglion and the local anesthesia is again deposited.  The procedure generally takes less than one  minute.  Conditions that my be helped with stellate ganglion blockade;   Reflex sympathetic dystrophy (RSD)  Postherpetic neuralgia  Diabetic neuropathy  Pain from nerve lesions or injury  Herpes zoster ( shingles  Vascular disease  Raynaud's disease  Preparation for the injection: 1. Do not eat any solid food or dairy products within 6 hours of your appointment. 2. You may drink clear liquids up to 2 hours before appointment.  Clear liquids include water, black coffee, juice or soda.  No milk or cream please. 3. You may take your regular medication, including pain medications, with a sip of water before you appointment.  Diabetics should hold regular insulin (if taken separately) and take 1/2 normal NPH dose the morning of the procedure.  Carry some sugar containing items with you to your appointment. 4. A drive must accompany you and be prepared to drive you home after your procedure. 5. Bring all your current medications with you. 6. An IV may be inserted and sedation may be given at the discretion of the physician. 7. A blood pressure cuff, EKG and other monitors will often be applied during the procedure.  Some patients may  need to have extra oxygen administered for a short period. 8. You will be asked to provide medical information, including your allergies, prior to the procedure.  We must know immediately if you are taking blood thinners (like Coumadin) or if you are allergic to IV iodine contrast (dye).  Possible side-effects   Bleeding from needle site  Blurry vision and droopy eyelid in that eye (temporary)  Stuffy nose  Temporary shortness of breath  Weakness in arm (temporary)  Arm may feel warm   Possible complications: All of these are extremely RARE   Seizures  Infection  Lung puncture  Bleeding in the neck  Nerve injury  Call if you experience:   Fever/chills  Extreme shortness of breath or inability to swallow  Redness, inflammation or drainage from the site  Weakness or numbness that last for greater than 24 hours  Please note:  If effective, we may have to do up to 3-5 blocks in a 3-4 week interval with physical therapy (depending on which condition you are being treated for).  After the procedure: You will be observed in the outpatient area and discharged home.  Please consult the discharge instructions given after the procedure for any post-procedure questions.  If you have any questions please call [phone] [facility]    Continue present medications. We may consider Metanx  Stellate ganglion block on 07/17/2014  F/U PCP for evaliation of  BP and general medical  condition.  F/U surgical evaluation.  F/U neurological evaluation.  May consider radiofrequency rhizolysis or intraspinal procedures pending response to present treatment and F/U evaluation.  Patient to call Pain Management Center should patient have concerns prior to scheduled return appointment.

## 2014-06-29 NOTE — Progress Notes (Signed)
Safety precautions to be maintained throughout the outpatient stay will include: orient to surroundings, keep bed in low position, maintain call bell within reach at all times, provide assistance with transfer out of bed and ambulation.  

## 2014-06-29 NOTE — Progress Notes (Deleted)
   Subjective:    Patient ID: Kelly Foster, female    DOB: 06/21/1950, 64 y.o.   MRN: [id number]  HPI    Review of Systems     Objective:   Physical Exam        Assessment & Plan:

## 2014-06-29 NOTE — Progress Notes (Signed)
 Subjective:    Patient ID: Kelly Foster, female    DOB: 11/22/1950, 63 y.o.   MRN: [id number]  HPI   patient is 63-year-old female who comes to pain management  At the request of Dr. Michael Manns for further evaluation and treatment of pain involving the left upper extremity. Patient is status post surgery of the left upper extremity for repositioning of the nerve according to patient patient states that she fell at [employer] in June 2015 the patient was later found to be somewhat disoriented and was taken to [facility] for evaluation and treatment per patient was diagnosed with urinary tract infection. Patient had mental status for approximately 3 days patient states that she awakened to have pain involving the region of the left upper extremity. States she underwent cervical MRI as well as nerve studies of the upper extremity and was found to have nerve entrapment. Subsequently the patient underwent surgery of the left upper extremity. Patient states that she has burning stinging sensation of the upper extremity which is aching and annoying constant sensation patient states that she is without known factors which have alleviated the pain is the pain discussed and all the time without any particular precipitating factors The  Pain is aggravated by touch and any type of motion we discussed patient's condition and will consider patient for interventional treatment at time return appointment in attempt to decrease severely disabling pain of the upper extremity. We will consider patient for stellate ganglion block at time of return appointment. The patient was understanding and in agreement with suggested treatment plan.    Review of Systems   Cardiovascular  igh blood pressure   Pulmonary    Unremarkable   Neurological  Unremarkable    psychological  Unremarkable   Gastrointestinal  Unremarkable   Genitourinary  Unremarkable    Hematological  Unremarkable   Endocrine  Unremarkable   Rheumatological   Osteoarthritis   Musculoskeletal   Unremarkable   Of the significant history  Unremarkable       Objective:   Physical Exam  tenderness to palpation of the splenius capitis and occipitalis musculature region of mild to moderate degree. No masses of the head and neck were noted. Mild to moderate tenderness of the acromioclavicular glenohumeral joint region. Decreased grip strength on the left compared to the right. No surgical scar of the left upper extremity forearm  with allodynia noted. Unremarkable Spurling's maneuver was noted. Patient over the thoracic facet thoracic paraspinal musculature region reproduced moderate discomfort in the upper thoracic region. No crepitus of the thoracic region noted Palpation of the lumbar paraspinal musculature region lumbar facet region was a tends to palpation of mild degree mild tenderness over the PSIS and PII S regions. Mild tenderness of the gluteal and piriformis musculature region was noted. Raising tolerates approximately 30 without increased pain with dorsiflexion noted. Negative clonus negative Homans. Mild tennis of the greater trochanteric region and iliotibial band region. Abdomen nontender with no costovertebral angle tenderness noted.      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 May consider Metanx    Stellate ganglion block  To  be performedat time of return appointment  F/U PCP for evaliation of  BP and general medical  condition.  F/U surgical evaluation.    F/U neurological evaluation.  May consider radiofrequency rhizolysis or intraspinal procedures pending response to present treatment and F/U evaluation.  Patient to call Pain Management Center should patient have concerns  prior to scheduled return appointment.  

## 2014-07-17 ENCOUNTER — Ambulatory Visit: Payer: Medicare Other | Admitting: Pain Medicine

## 2014-07-24 ENCOUNTER — Encounter: Payer: Self-pay | Admitting: *Deleted

## 2014-07-25 ENCOUNTER — Ambulatory Visit (INDEPENDENT_AMBULATORY_CARE_PROVIDER_SITE_OTHER): Payer: Medicare Other | Admitting: Urology

## 2014-07-25 ENCOUNTER — Encounter: Payer: Self-pay | Admitting: Urology

## 2014-07-25 VITALS — BP 132/51 | HR 76 | Ht 67.0 in | Wt 216.1 lb

## 2014-07-25 DIAGNOSIS — R350 Frequency of micturition: Secondary | ICD-10-CM

## 2014-07-25 LAB — BLADDER SCAN AMB NON-IMAGING: Scan Result: 16

## 2014-07-25 MED ORDER — OXYBUTYNIN CHLORIDE ER 5 MG PO TB24: 5.0000 mg | ORAL_TABLET | Freq: Every day | ORAL | Status: DC

## 2014-07-25 NOTE — Progress Notes (Signed)
07/25/2014 2:56 PM   Kelly Foster Aug 31, 1950 [id number]  Referring provider: No referring provider defined for this encounter.  Chief Complaint  Patient presents with  . Urinary Frequency    3 month follow up    HPI: [name] is a pleasant 64 year old AA female who is currently on Oxybutynin ER 5mg  q day for her urinary frequency.  She is still reporting good results.  Prior to the oxybutynin, she was voiding up wards of every 14 minutes during the day and 1 to 4 times at night.  She was also having urge incontinence.  Now, she is voiding 4 times during the day and once at night.    She denies any dry mouth, constipation, cognitive impairment or the inability to urinate with the oxybutynin.  She also does not report any recent fevers, chills, nausea, vomiting, gross hematuria, suprapubic pain or urinary tract infections.  Her PVR today is 16 mL.    She had been on Vesicare initially without any improvement in her symptoms.    PMH: Past Medical History  Diagnosis Date  . Allergy   . Anxiety   . Hypertension   . Bipolar 1 disorder   . Manic depressive disorder   . Hyperlipidemia   . DJD (degenerative joint disease)   . Hip pain   . Prediabetes   . Arthritis   . Ulnar neuropathy   . Urinary frequency   . Overweight   . Vaginal atrophy     Surgical History: Past Surgical History  Procedure Laterality Date  . Arm surgery Left   . Abdominal hysterectomy    . Cesarean section      Home Medications:    Medication List       This list is accurate as of: 07/25/14  2:56 PM.  Always use your most recent med list.               acetaminophen 325 MG tablet  Commonly known as:  TYLENOL  Take 650 mg by mouth.     albuterol 108 (90 BASE) MCG/ACT inhaler  Commonly known as:  PROVENTIL HFA;VENTOLIN HFA  Inhale into the lungs.     Calcium-Vitamin D 600-200 MG-UNIT per tablet  Take by mouth.     Cetirizine HCl 10 MG Caps  Take by mouth.     diclofenac 50  MG tablet  Commonly known as:  CATAFLAM  Take 50 mg by mouth.     divalproex 500 MG DR tablet  Commonly known as:  DEPAKOTE  Take 500 mg by mouth at bedtime. Takes 2 tablets at night     gabapentin 300 MG capsule  Commonly known as:  NEURONTIN  Take by mouth.     HYDROcodone-acetaminophen 5-325 MG per tablet  Commonly known as:  NORCO/VICODIN  1 po bid prn     lisinopril 5 MG tablet  Commonly known as:  PRINIVIL,ZESTRIL  Take 5 mg by mouth daily.     loratadine 10 MG tablet  Commonly known as:  CLARITIN  Take 10 mg by mouth daily.     mometasone 50 MCG/ACT nasal spray  Commonly known as:  NASONEX  2 sprays by Each Nare route daily.     MULTI-VITAMINS Tabs  Take by mouth.     nystatin 100000 UNITS vaginal tablet  Place vaginally.     oxybutynin 5 MG 24 hr tablet  Commonly known as:  DITROPAN-XL  Take 5 mg by mouth at bedtime.  Polyethyl Glycol-Propyl Glycol 0.4-0.3 % Soln  Apply to eye.     RISPERDAL PO  Take by mouth at bedtime.     sertraline 100 MG tablet  Commonly known as:  ZOLOFT  Take 200 mg by mouth.     simvastatin 40 MG tablet  Commonly known as:  ZOCOR  Take 40 mg by mouth daily.     STIMULANT LAXATIVE 5 MG EC tablet  Generic drug:  bisacodyl  Take by mouth.     STOOL SOFTENER 100 MG capsule  Generic drug:  docusate sodium  Take 100 mg by mouth.        Allergies:  Allergies  Allergen Reactions  . Codeine Itching  . Tramadol Itching    Family History: Family History  Problem Relation Age of Onset  . Stroke Father   . Colon cancer Sister   . Alzheimer's disease Mother   . Kidney disease Neg Hx     Social History:  reports that she quit smoking about 19 years ago. Her smoking use included Cigarettes. She smoked 1.00 pack per day. She has never used smokeless tobacco. She reports that she does not drink alcohol or use illicit drugs.  ROS: Urological Symptom Review  Patient is experiencing the following symptoms: Frequent  urination Get up at night to urinate   Review of Systems  Gastrointestinal (upper)  : Negative for upper GI symptoms  Gastrointestinal (lower) : Negative for lower GI symptoms  Constitutional : Negative for symptoms  Skin: Negative for skin symptoms  Eyes: Negative for eye symptoms  Ear/Nose/Throat : Sinus problems  Hematologic/Lymphatic: Negative for Hematologic/Lymphatic symptoms  Cardiovascular : Negative for cardiovascular symptoms  Respiratory : Negative for respiratory symptoms  Endocrine: Negative for endocrine symptoms  Musculoskeletal: Joint pain  Neurological: Negative for neurological symptoms  Psychologic: Negative for psychiatric symptoms   Physical Exam: Ht  (1.702 m)  Wt 216 lb 1.6 oz (98.022 kg)  BMI 33.84 kg/m2   Laboratory Data: Results for orders placed or performed in visit on 07/25/14  BLADDER SCAN AMB NON-IMAGING  Result Value Ref Range   Scan Result 16    Lab Results  Component Value Date   WBC 6.6 02/24/2014   HGB 12.2 02/24/2014   HCT 38.0 02/24/2014   MCV 91 02/24/2014   PLT 194 02/24/2014    Lab Results  Component Value Date   CREATININE 0.75 02/24/2014    No results found for: PSA  No results found for: TESTOSTERONE  No results found for: HGBA1C  Urinalysis No results found for: COLORURINE, APPEARANCEUR, LABSPEC, PHURINE, GLUCOSEU, HGBUR, BILIRUBINUR, KETONESUR, PROTEINUR, UROBILINOGEN, NITRITE, LEUKOCYTESUR  Pertinent Imaging: Results for TAJANAY, HURLEY (MRN [id number]) as of 07/25/2014 14:55  Ref. Range 07/25/2014 14:48  Scan Result Unknown 16   Assessment & Plan:   1. Urinary frequency:  Patient's symptoms are well-controlled on oxybutynin ER 5 mg daily.  PVR is minimal.  She is not experiencing any untoward side effects, such as: dry mouth, constipation, inability to urinate or cognitive dysfunction.  She will continue the medication.  Refills are sent to her pharmacy.  She will RTC in one  year for PVR and symptoms recheck.   - BLADDER SCAN AMB NON-IMAGING   No Follow-up on file.  Michiel Cowboy, PA-C  [facility] [address], [address] [place], [place] [address] [phone]

## 2014-08-22 ENCOUNTER — Ambulatory Visit: Payer: Medicare Other | Admitting: Pain Medicine

## 2014-09-15 ENCOUNTER — Other Ambulatory Visit: Payer: Self-pay | Admitting: Internal Medicine

## 2014-09-15 DIAGNOSIS — Z1231 Encounter for screening mammogram for malignant neoplasm of breast: Secondary | ICD-10-CM

## 2014-09-19 ENCOUNTER — Ambulatory Visit: Payer: Medicare Other | Admitting: Urology

## 2014-09-19 ENCOUNTER — Other Ambulatory Visit: Payer: Self-pay | Admitting: Obstetrics and Gynecology

## 2014-09-19 ENCOUNTER — Telehealth: Payer: Self-pay | Admitting: *Deleted

## 2014-09-19 NOTE — Telephone Encounter (Signed)
Called patient per shannon to see why she was on schedule for 09/19/14. Patient should recheck in one year since last ov. Patient stated she needs refill on her Oxybutynin 5 mg.  Let patient know we would give her a refill and she didn't need to come to the appt. Upon review of medication, Carollee Herter had just filled med in June 2016 for 90 tablets with three refills. I called patient back and left a message that she should have refills left to call her pharmacy and have it refilled. If she has any problems contact the office. [facility] [address]

## 2014-09-26 ENCOUNTER — Ambulatory Visit: Payer: Medicare Other

## 2014-10-02 ENCOUNTER — Ambulatory Visit
Admission: RE | Admit: 2014-10-02 | Discharge: 2014-10-02 | Disposition: A | Discharge status: Home / Self Care | Payer: Medicare Other | Source: Ambulatory Visit | Attending: Internal Medicine | Admitting: Internal Medicine

## 2014-10-02 ENCOUNTER — Other Ambulatory Visit: Payer: Self-pay | Admitting: Internal Medicine

## 2014-10-02 DIAGNOSIS — Z1231 Encounter for screening mammogram for malignant neoplasm of breast: Secondary | ICD-10-CM | POA: Diagnosis present

## 2014-10-20 ENCOUNTER — Emergency Department
Admission: EM | Admit: 2014-10-20 | Discharge: 2014-10-20 | Disposition: A | Discharge status: Home / Self Care | Payer: Medicare Other | Attending: Emergency Medicine | Admitting: Emergency Medicine

## 2014-10-20 ENCOUNTER — Encounter: Payer: Self-pay | Admitting: *Deleted

## 2014-10-20 DIAGNOSIS — M25552 Pain in left hip: Secondary | ICD-10-CM | POA: Diagnosis not present

## 2014-10-20 DIAGNOSIS — G8929 Other chronic pain: Secondary | ICD-10-CM | POA: Diagnosis not present

## 2014-10-20 DIAGNOSIS — Z87891 Personal history of nicotine dependence: Secondary | ICD-10-CM | POA: Insufficient documentation

## 2014-10-20 DIAGNOSIS — Z79899 Other long term (current) drug therapy: Secondary | ICD-10-CM | POA: Insufficient documentation

## 2014-10-20 DIAGNOSIS — I10 Essential (primary) hypertension: Secondary | ICD-10-CM | POA: Diagnosis not present

## 2014-10-20 MED ORDER — DIAZEPAM 2 MG PO TABS: 1.0000 mg | ORAL_TABLET | Freq: Two times a day (BID) | ORAL | Status: DC | PRN

## 2014-10-20 MED ORDER — KETOROLAC TROMETHAMINE 60 MG/2ML IM SOLN
60.0000 mg | Freq: Once | INTRAMUSCULAR | Status: AC
  Administered 2014-10-20: 60 mg via INTRAMUSCULAR
  Filled 2014-10-20: qty 2

## 2014-10-20 MED ORDER — ORPHENADRINE CITRATE 30 MG/ML IJ SOLN
60.0000 mg | INTRAMUSCULAR | Status: AC
  Administered 2014-10-20: 60 mg via INTRAMUSCULAR
  Filled 2014-10-20: qty 2

## 2014-10-20 MED ORDER — KETOROLAC TROMETHAMINE 10 MG PO TABS: 10.0000 mg | ORAL_TABLET | Freq: Three times a day (TID) | ORAL | Status: DC

## 2014-10-20 NOTE — ED Notes (Signed)
Pt with Hx of chronic left hip pain, out of her pain meds, seen her PMD on Wednesday, recd a cortisone injection, states not working

## 2014-10-20 NOTE — ED Provider Notes (Signed)
[facility] Emergency Department Provider Note ____________________________________________  Time seen: 1452  I have reviewed the triage vital signs and the nursing notes.  HISTORY  Chief Complaint  Hip Pain  HPI Kelly Foster is a 64 y.o. female who arrives via EMS is a patient who lives alone and has a history of chronic left hip pain. She seen monthly pointing to her, by her primary care provider or his own injections. She receives that injection on Wednesday, 2 days prior to arrival. She reports that this particular time the injection is not helpful with her chronic pain.She does get monthly oxycodone prescriptions as well.  Past Medical History  Diagnosis Date  . Allergy   . Anxiety   . Hypertension   . Bipolar 1 disorder   . Manic depressive disorder   . Hyperlipidemia   . DJD (degenerative joint disease)   . Hip pain   . Prediabetes   . Arthritis   . Ulnar neuropathy   . Urinary frequency   . Overweight   . Vaginal atrophy     Patient Active Problem List   Diagnosis Date Noted  . Urinary frequency 07/29/2014  . Neuritis or radiculitis due to rupture of lumbar intervertebral disc 05/29/2014  . Spinal stenosis 11/29/2013  . Extremity numbness 11/29/2013  . Degenerative arthritis of hip 07/01/2013  . 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 Bipolar 1 disorder, mixed 06/06/2013    Past Surgical History  Procedure Laterality Date  . Arm surgery Left   . Abdominal hysterectomy    . Cesarean section      Current Outpatient Rx  Name  Route  Sig  Dispense  Refill  . acetaminophen (TYLENOL) 325 MG tablet   Oral   Take 650 mg by mouth.         Marland Kitchen albuterol (PROVENTIL HFA;VENTOLIN HFA) 108 (90 BASE) MCG/ACT inhaler   Inhalation   Inhale into the lungs.         . bisacodyl (STIMULANT LAXATIVE) 5 MG EC tablet   Oral   Take by mouth.         . Calcium-Vitamin D 600-200 MG-UNIT per tablet   Oral   Take by mouth.          . Cetirizine HCl 10 MG CAPS   Oral   Take by mouth.         . diazepam (VALIUM) 2 MG tablet   Oral   Take 0.5 tablets (1 mg total) by mouth every 12 (twelve) hours as needed for muscle spasms.   5 tablet   0   . diclofenac (CATAFLAM) 50 MG tablet   Oral   Take 50 mg by mouth.         . divalproex (DEPAKOTE) 500 MG DR tablet   Oral   Take 500 mg by mouth at bedtime. Takes 2 tablets at night         . docusate sodium (STOOL SOFTENER) 100 MG capsule   Oral   Take 100 mg by mouth.         . gabapentin (NEURONTIN) 300 MG capsule   Oral   Take by mouth.         Marland Kitchen HYDROcodone-acetaminophen (NORCO/VICODIN) 5-325 MG per tablet      1 po bid prn         . ketorolac (TORADOL) 10 MG tablet   Oral   Take 1 tablet (10 mg total) by mouth every 8 (eight) hours.  15 tablet   0   . lisinopril (PRINIVIL,ZESTRIL) 5 MG tablet   Oral   Take 5 mg by mouth daily.         Marland Kitchen loratadine (CLARITIN) 10 MG tablet   Oral   Take 10 mg by mouth daily.         . mometasone (NASONEX) 50 MCG/ACT nasal spray      2 sprays by Each Nare route daily.         . Multiple Vitamin (MULTI-VITAMINS) TABS   Oral   Take by mouth.         . nystatin 100000 UNITS vaginal tablet   Vaginal   Place vaginally.         Marland Kitchen oxybutynin (DITROPAN-XL) 5 MG 24 hr tablet      TAKE ONE TABLET BY MOUTH ONCE DAILY   30 tablet   0   . Polyethyl Glycol-Propyl Glycol 0.4-0.3 % SOLN   Ophthalmic   Apply to eye.         Marland Kitchen RisperiDONE (RISPERDAL PO)   Oral   Take by mouth at bedtime.         . sertraline (ZOLOFT) 100 MG tablet   Oral   Take 200 mg by mouth.         . simvastatin (ZOCOR) 40 MG tablet   Oral   Take 40 mg by mouth daily.          Allergies Codeine and Tramadol  Family History  Problem Relation Age of Onset  . Stroke Father   . Colon cancer Sister   . Alzheimer's disease Mother   . Kidney disease Neg Hx     Social History Social History  Substance  Use Topics  . Smoking status: Former Smoker -- 1.00 packs/day    Types: Cigarettes    Quit date: 03/01/1995  . Smokeless tobacco: Never Used     Comment: quit 1998  . Alcohol Use: No   Review of Systems  Constitutional: Negative for fever. Eyes: Negative for visual changes. ENT: Negative for sore throat. Cardiovascular: Negative for chest pain. Respiratory: Negative for shortness of breath. Gastrointestinal: Negative for abdominal pain, vomiting and diarrhea. Genitourinary: Negative for dysuria. Musculoskeletal: Negative for back pain. Chronic left hip pain and disability. Skin: Negative for rash. Neurological: Negative for headaches, focal weakness or numbness. ____________________________________________  PHYSICAL EXAM:  VITAL SIGNS: ED Triage Vitals  Enc Vitals Group     BP 10/20/14 1430 145/68 mmHg     Pulse Rate 10/20/14 1430 88     Resp --      Temp 10/20/14 1430 98.5 F (36.9 C)     Temp Source 10/20/14 1430 Oral     SpO2 10/20/14 1430 99 %     Weight 10/20/14 1428 211 lb (95.709 kg)     Height 10/20/14 1428 5\' 11"  (1.803 m)     Head Cir --      Peak Flow --      Pain Score 10/20/14 1426 4     Pain Loc --      Pain Edu? --      Excl. in GC? --    Constitutional: Alert and oriented. Well appearing and in no distress. Eyes: Conjunctivae are normal. PERRL. Normal extraocular movements. ENT   Head: Normocephalic and atraumatic.   Nose: No congestion/rhinorrhea.   Mouth/Throat: Mucous membranes are moist.   Neck: Supple. No thyromegaly. Hematological/Lymphatic/Immunological: No cervical lymphadenopathy. Cardiovascular: Normal rate, regular rhythm.  Respiratory: Normal  respiratory effort. No wheezes/rales/rhonchi. Gastrointestinal: Soft and nontender. No distention. Musculoskeletal: Left hip with normal range of motion. Negative straight leg raise. Normal dorsiflexion and plantarflexion. Nontender with normal range of motion in all extremities.   Neurologic: Cranial nerves II through XII grossly intact. Normal lower extremity DTRs bilaterally. Normal gait without ataxia. Normal speech and language. No gross focal neurologic deficits are appreciated. Skin:  Skin is warm, dry and intact. No rash noted. Psychiatric: Mood and affect are normal. Patient exhibits appropriate insight and judgment. ____________________________________________  PROCEDURES  Toradol 60 mg IM Norflex 60 mg IM ____________________________________________  INITIAL IMPRESSION / ASSESSMENT AND PLAN / ED COURSE  Patient with chronic hip pain with a flare above baseline. Patient is to be discharged with prescription muscle relaxant and anti-inflammatory. She is continued to dose her daily hydrocodone and other medicines S/P was the prescribed. She will follow with Dr. Yves Dill as scheduled.  ____________________________________________  FINAL CLINICAL IMPRESSION(S) / ED DIAGNOSES  Final diagnoses:  Hip pain, chronic, left      Lissa Hoard, PA-C 10/21/14 1627  Jeanmarie Plant, MD 10/21/14 2117

## 2014-10-20 NOTE — Discharge Instructions (Signed)
Hip Pain Your hip is the joint between your upper legs and your lower pelvis. The bones, cartilage, tendons, and muscles of your hip joint perform a lot of work each day supporting your body weight and allowing you to move around. Hip pain can range from a minor ache to severe pain in one or both of your hips. Pain may be felt on the inside of the hip joint near the groin, or the outside near the buttocks and upper thigh. You may have swelling or stiffness as well.  HOME CARE INSTRUCTIONS   Take medicines only as directed by your health care provider.  Apply ice to the injured area:  Put ice in a plastic bag.  Place a towel between your skin and the bag.  Leave the ice on for 15-20 minutes at a time, 3-4 times a day.  Keep your leg raised (elevated) when possible to lessen swelling.  Avoid activities that cause pain.  Follow specific exercises as directed by your health care provider.  Sleep with a pillow between your legs on your most comfortable side.  Record how often you have hip pain, the location of the pain, and what it feels like. SEEK MEDICAL CARE IF:   You are unable to put weight on your leg.  Your hip is red or swollen or very tender to touch.  Your pain or swelling continues or worsens after 1 week.  You have increasing difficulty walking.  You have a fever. SEEK IMMEDIATE MEDICAL CARE IF:   You have fallen.  You have a sudden increase in pain and swelling in your hip. MAKE SURE YOU:   Understand these instructions.  Will watch your condition.  Will get help right away if you are not doing well or get worse. Document Released: 07/10/2009 Document Revised: 06/06/2013 Document Reviewed: 09/16/2012 [facility] Patient Information 2015 [place], [place]. This information is not intended to replace advice given to you by your health care provider. Make sure you discuss any questions you have with your health care provider.  Hold your Diclofenac for the next few  days, while you take the Ketorolac and Valium.  Follow-up with your primary care provider as needed.

## 2014-10-20 NOTE — ED Notes (Signed)
Chronic left hip,.

## 2014-10-31 ENCOUNTER — Ambulatory Visit: Payer: Medicare Other | Attending: Pain Medicine | Admitting: Pain Medicine

## 2014-10-31 ENCOUNTER — Encounter: Payer: Self-pay | Admitting: Pain Medicine

## 2014-10-31 DIAGNOSIS — M5416 Radiculopathy, lumbar region: Secondary | ICD-10-CM | POA: Diagnosis not present

## 2014-10-31 DIAGNOSIS — M533 Sacrococcygeal disorders, not elsewhere classified: Secondary | ICD-10-CM | POA: Diagnosis not present

## 2014-10-31 DIAGNOSIS — Z9889 Other specified postprocedural states: Secondary | ICD-10-CM | POA: Insufficient documentation

## 2014-10-31 DIAGNOSIS — M5136 Other intervertebral disc degeneration, lumbar region: Secondary | ICD-10-CM | POA: Insufficient documentation

## 2014-10-31 DIAGNOSIS — G588 Other specified mononeuropathies: Secondary | ICD-10-CM | POA: Insufficient documentation

## 2014-10-31 DIAGNOSIS — M79601 Pain in right arm: Secondary | ICD-10-CM | POA: Diagnosis present

## 2014-10-31 DIAGNOSIS — M51369 Other intervertebral disc degeneration, lumbar region without mention of lumbar back pain or lower extremity pain: Secondary | ICD-10-CM

## 2014-10-31 DIAGNOSIS — M5030 Other cervical disc degeneration, unspecified cervical region: Secondary | ICD-10-CM

## 2014-10-31 DIAGNOSIS — G90512 Complex regional pain syndrome I of left upper limb: Secondary | ICD-10-CM

## 2014-10-31 DIAGNOSIS — M79602 Pain in left arm: Secondary | ICD-10-CM | POA: Diagnosis present

## 2014-10-31 NOTE — Patient Instructions (Addendum)
PLAN   Continue present medication  Lumbosacral selective nerve root block to be performed at time return appointment  F/U PCP Dr. Lawerance Bach for evaliation of  BP and general medical  condition  F/U surgical evaluation. May consider pending follow-up evaluations  F/U neurological evaluation. May consider pending follow-up evaluations  May consider radiofrequency rhizolysis or intraspinal procedures pending response to present treatment and F/U evaluation   Patient to call [facility] Management Center should patient have concerns prior to scheduled return appointment. Pain Management Discharge Instructions  General Discharge Instructions :  If you need to reach your doctor call: Monday-Friday 8:00 am - 4:00 pm at [phone] or toll free [phone].  After clinic hours [phone] to have operator reach doctor.  Bring all of your medication bottles to all your appointments in the pain clinic.  To cancel or reschedule your appointment with Pain Management please remember to call 24 hours in advance to avoid a fee.  Refer to the educational materials which you have been given on: General Risks, I had my Procedure. Discharge Instructions, Post Sedation.  Post Procedure Instructions:  The drugs you were given will stay in your system until tomorrow, so for the next 24 hours you should not drive, make any legal decisions or drink any alcoholic beverages.  You may eat anything you prefer, but it is better to start with liquids then soups and crackers, and gradually work up to solid foods.  Please notify your doctor immediately if you have any unusual bleeding, trouble breathing or pain that is not related to your normal pain.  Depending on the type of procedure that was done, some parts of your body may feel week and/or numb.  This usually clears up by tonight or the next day.  Walk with the use of an assistive device or accompanied by an adult for the 24 hours.  You may use ice on the  affected area for the first 24 hours.  Put ice in a Ziploc bag and cover with a towel and place against area 15 minutes on 15 minutes off.  You may switch to heat after 24 hours.Lumbar Sympathetic Block Patient Information  Description: The lumbar plexus is a group of nerves that are part of the sympathetic nervous system.  These nerves supply organs in the pelvis and legs.  Lumbar sympathetic blocks are utilized for the diagnosis and treatment of painful conditions in these areas.   The lumbar plexus is located on both sides of the aorta at approximately the level of the second lumbar vertebral body.  The block will be performed with you lying on your abdomen with a pillow underneath.  Using direct x-ray guidance,   The plexus will be located on both sides of the spine.  Numbing medicine will be used to deaden the skin prior to needle insertion.  In most cases, a small amount of sedation can be give by IV prior to the numbing medicine.  One or two small needles will be placed near the plexus and local anesthetic will be injected.  This may make your leg(s) feel warm.  The Entire block usually lasts about 15-25 minutes.  Conditions which may be treated by lumbar sympathetic block:   Reflex sympathetic dystrophy  Phantom limb pain  Peripheral neuropathy  Peripheral vascular disease ( inadequate blood flow )  Cancer pain of pelvis, leg and kidney  Preparation for the injection:  1. Do note eat any solid food or diary products within 6 hours of your appointment.  2. You may drink clear liquids up to 2 hours before appointment.  Clear liquids include water, black coffee, juice or soda.  No milk or cream please. 3. You may take your regular medication, including pain medications, with a sip of water before you appointment.  Diabetics should hold regular insulin ( if taken separately ) and take 1/2 NPH dose the morning of the procedure .  Carry some sugar containing items with you to your  appointment. 4. A driver must accompany you and be prepared to drive you home after your procedure. 5. Bring all your current medication with you. 6. An IV may be inserted and sedation may be given at the discretion of the physician.  7. A blood pressure cuff, EKG and other monitors will often be applied during the procedure.  Some patients may need to have extra oxygen administered for a short period. 8. You will be asked to provide medical information, including your allergies and medications, prior to the procedure.  We must know immediately if your taking blood thinners (like Coumadin/Warfarin) or if you are allergic to IV iodine contrast (dye).  We must know if you could possibly be pregnant.  Possible side-effects   Bleeding from needle site or deeper  Infection (rare, can require surgery)  Nerve injury (rare)  Numbness & tingling (temporary)  Collapsed lung (rare)  Spinal headache (a headache worse with upright posture)  Light-headedness (temporary)  Pain at injection site (several days)  Decreased blood pressure (temporary)  Weakness in legs (temporary)  Seizure or other drug reaction (rare)  Call if you experience:   Fever/chills associated with headache or increased back/ neck pain  Headache worsened by an upright position  New onset weakness or numbness of an extremity below the injection site  Hives or difficulty breathing ( go to the emergency room)  Inflammation or drainage at the injections site(s)  New symptoms which are concerning to you  Please note:  If effective, we will often do a series of 2-3 injections spaced 3-6 weeks apart to maximally decrease your pain.  If initial series is effective, you may be a candidate for a more permanent block of the lumbar sympathetic plexus.  If you have any questions please call [phone] [facility]

## 2014-10-31 NOTE — Progress Notes (Signed)
Subjective:    Patient ID: Kelly Foster, female    DOB: 1950/04/24, 64 y.o.   MRN: [id number]  HPI Patient is 64 year old female with who returns to [facility] for further evaluation and treatment of pain involving the region of the upper extremity region. On today's visit patient stated that her predominant pain involving the lower back and lower extremity region. Patient states she was severe pain radiating from the lumbar region to the lower extremity on the left. Patient states the pain became more intense as patient stood on the feet and became more intense as the day progresses. Patient without any known trauma or change in events of daily living which could've caused the lower back and lower extremity pain. We discussed patient's condition and will proceed with interventional treatment at time return appointment in attempt to decrease severity of symptoms, minimize progression of symptoms, and avoid the need for more involved treatment. Patient is with prior MRI performed at [facility]. We will request the results of the lumbar mri and we'll proceed with updating lumbar MRI if the lumbar MRI is unavailable. The patient is in hopes of being able to undergo lumbosacral selective nerve root block in attempt to decrease severity of symptoms, minimize progression of symptoms, and avoid need for more involved treatment. The patient was with understanding and in agreement with suggested treatment plan.     Review of Systems     Objective:   Physical Exam  There was tends to palpation  of the splenius capitis and occipitalis musculature regions of minimal degree. There was mild tinnitus of the acromial clavicular and glenohumeral joint region. The upper extremity was with mild tends to palpation without any evidence of allodynia. There was decreased grip strength. No new lesions of the upper extremities were noted.There were well-healed surgical scars of the  upper extremity noted There was decreased grip strength. Tinel and Phalen's maneuver associated with mild discomfort. There appeared to be unremarkable Spurling's maneuver. Palpation over the thoracic facet thoracic paraspinal musculature region was with minimal discomfort. No crepitus of the thoracic region was noted. Palpation over the lumbar paraspinal muscles region lumbar facet region was associated with moderate discomfort. There was moderate tends to palpation over the lumbar facets on the left as well as on the right extension and palpation of the lumbar facets reproduce moderate discomfort. Lateral bending and rotation associated with moderate discomfort. Straight leg raising limited to approximately 20 with questionably increased pain with dorsiflexion noted. There appeared to be negative clonus negative Homans. No definite sensory deficit of dermatomal distribution detected. DTRs were difficult to elicit patient difficulty relaxing. There was mild tenderness of the greater trochanteric region and iliotibial band region. There was mild tenderness of the PSIS and PII S regions. There was negative clonus negative Homans. Abdomen was nontender with no costovertebral tenderness noted.     Assessment & Plan:  Degenerative disc disease lumbar spine  Lumbar radiculopathy  Lumbar facet syndrome  Sacroiliac joint dysfunction  Complex regional pain syndrome of the left upper extremity  Status post surgical intervention of left upper extremity   Neuralgia of left upper extremity     PLAN   Continue present medication  Lumbosacral selective nerve root block to be performed at time return appointment  F/U PCP Dr. Orlene Och evaliation of  BP and general medical  condition  F/U surgical evaluation. May consider pending follow-up evaluations  F/U neurological evaluation. May consider pending follow-up evaluations  May  consider radiofrequency rhizolysis or intraspinal procedures pending  response to present treatment and F/U evaluation   Patient to call Pain Management Center should patient have concerns prior to scheduled return appointment.

## 2014-11-01 ENCOUNTER — Telehealth: Payer: Self-pay | Admitting: Pain Medicine

## 2014-11-01 NOTE — Telephone Encounter (Signed)
Pt states that her feet are swollen and that she wants Dr Metta Clines to call her something in for the swelling and to call her and tell her why they are swelling

## 2014-11-01 NOTE — Telephone Encounter (Signed)
Nurses, Please have patient see her primary care physician today or by tomorrow regarding swelling of the lower extremities or go to the emergency room for evaluation today. I feel that this is an issue that her primary care physician should be made aware of and should address

## 2014-11-02 ENCOUNTER — Ambulatory Visit: Payer: Medicare Other | Admitting: Pain Medicine

## 2014-11-08 ENCOUNTER — Other Ambulatory Visit: Payer: Self-pay | Admitting: Pain Medicine

## 2014-11-15 ENCOUNTER — Ambulatory Visit: Payer: Medicare Other | Admitting: Pain Medicine

## 2014-11-20 ENCOUNTER — Ambulatory Visit: Payer: Medicare Other | Attending: Pain Medicine | Admitting: Pain Medicine

## 2014-11-20 ENCOUNTER — Encounter: Payer: Self-pay | Admitting: Pain Medicine

## 2014-11-20 VITALS — BP 120/58 | HR 76 | Temp 98.2°F | Resp 18 | Ht 67.0 in | Wt 209.0 lb

## 2014-11-20 DIAGNOSIS — M533 Sacrococcygeal disorders, not elsewhere classified: Secondary | ICD-10-CM

## 2014-11-20 DIAGNOSIS — M792 Neuralgia and neuritis, unspecified: Secondary | ICD-10-CM

## 2014-11-20 DIAGNOSIS — M5030 Other cervical disc degeneration, unspecified cervical region: Secondary | ICD-10-CM

## 2014-11-20 DIAGNOSIS — M5136 Other intervertebral disc degeneration, lumbar region: Secondary | ICD-10-CM | POA: Diagnosis not present

## 2014-11-20 DIAGNOSIS — M545 Low back pain: Secondary | ICD-10-CM | POA: Diagnosis present

## 2014-11-20 DIAGNOSIS — M79605 Pain in left leg: Secondary | ICD-10-CM | POA: Diagnosis present

## 2014-11-20 DIAGNOSIS — M79604 Pain in right leg: Secondary | ICD-10-CM | POA: Diagnosis present

## 2014-11-20 DIAGNOSIS — M5416 Radiculopathy, lumbar region: Secondary | ICD-10-CM

## 2014-11-20 DIAGNOSIS — G90512 Complex regional pain syndrome I of left upper limb: Secondary | ICD-10-CM

## 2014-11-20 MED ORDER — SODIUM CHLORIDE 0.9 % IJ SOLN: 20.0000 mL | Freq: Once | INTRAMUSCULAR | Status: DC

## 2014-11-20 MED ORDER — FENTANYL CITRATE (PF) 100 MCG/2ML IJ SOLN
INTRAMUSCULAR | Status: AC
Start: 2014-11-20 — End: 2014-11-20
  Administered 2014-11-20: 100 ug via INTRAVENOUS
  Filled 2014-11-20: qty 2

## 2014-11-20 MED ORDER — CEFAZOLIN SODIUM 1 G IJ SOLR
INTRAMUSCULAR | Status: AC
  Administered 2014-11-20: 1 g via INTRAVENOUS
  Filled 2014-11-20: qty 10

## 2014-11-20 MED ORDER — ORPHENADRINE CITRATE 30 MG/ML IJ SOLN
60.0000 mg | Freq: Once | INTRAMUSCULAR | Status: AC
  Administered 2014-11-20: 13:00:00 via INTRAMUSCULAR

## 2014-11-20 MED ORDER — TRIAMCINOLONE ACETONIDE 40 MG/ML IJ SUSP
INTRAMUSCULAR | Status: AC
  Filled 2014-11-20: qty 1

## 2014-11-20 MED ORDER — BUPIVACAINE HCL (PF) 0.25 % IJ SOLN
INTRAMUSCULAR | Status: AC
  Filled 2014-11-20: qty 30

## 2014-11-20 MED ORDER — FENTANYL CITRATE (PF) 100 MCG/2ML IJ SOLN: 100.0000 ug | Freq: Once | INTRAMUSCULAR | Status: DC

## 2014-11-20 MED ORDER — ORPHENADRINE CITRATE 30 MG/ML IJ SOLN
INTRAMUSCULAR | Status: AC
  Filled 2014-11-20: qty 2

## 2014-11-20 MED ORDER — BUPIVACAINE HCL (PF) 0.25 % IJ SOLN
30.0000 mL | Freq: Once | INTRAMUSCULAR | Status: AC
  Administered 2014-11-20: 13:00:00

## 2014-11-20 MED ORDER — CEFUROXIME AXETIL 250 MG PO TABS: 250.0000 mg | ORAL_TABLET | Freq: Two times a day (BID) | ORAL | Status: DC

## 2014-11-20 MED ORDER — CEFAZOLIN SODIUM 1-5 GM-% IV SOLN: 1.0000 g | Freq: Once | INTRAVENOUS | Status: DC

## 2014-11-20 MED ORDER — MIDAZOLAM HCL 5 MG/5ML IJ SOLN: 5.0000 mg | Freq: Once | INTRAMUSCULAR | Status: DC

## 2014-11-20 MED ORDER — MIDAZOLAM HCL 5 MG/5ML IJ SOLN
INTRAMUSCULAR | Status: AC
  Administered 2014-11-20: 2 mg via INTRAVENOUS
  Filled 2014-11-20: qty 5

## 2014-11-20 MED ORDER — TRIAMCINOLONE ACETONIDE 40 MG/ML IJ SUSP
40.0000 mg | Freq: Once | INTRAMUSCULAR | Status: AC
  Administered 2014-11-20: 13:00:00

## 2014-11-20 NOTE — Progress Notes (Signed)
Subjective:    Patient ID: Kelly Foster, female    DOB: 05/14/1950, 64 y.o.   MRN: [id number]  HPI  PROCEDURE PERFORMED: Lumbosacral selective nerve root block   NOTE: The patient is a 64 y.o. female who returns to [facility] Management Center for further evaluation and treatment of pain involving the lumbar and lower extremity region. Studies consisting of MRI has revealed the patient to be with evidence of degenerative disc disease of the lumbar spine. There is concern regarding radicular component of patient's pain due to intraspinal abnormalities of the lumbar region.. The risks, benefits, and expectations of the procedure have been explained to the patient who was understanding and in agreement with suggested treatment plan. We will proceed with interventional treatment as discussed and as explained to the patient. The patient is understanding and in agreement with suggested treatment plan.   DESCRIPTION OF PROCEDURE: Lumbosacral selective nerve root block with IV Versed, IV fentanyl conscious sedation, EKG, blood pressure, pulse, and pulse oximetry monitoring. The procedure was performed with the patient in the prone position under fluoroscopic guidance. With the patient in the prone position, Betadine prep of proposed entry site was performed. Local anesthetic skin wheal of proposed needle entry site was prepared with 1.5% plain lidocaine with AP view of the lumbosacral spine.   PROCEDURE #1: Needle placement at the left L 2 vertebral body: A 22 -gauge needle was inserted at the inferior border of the transverse process of the vertebral body with needle placed medial to the midline of the transverse process on AP view of the lumbosacral spine.   NEEDLE PLACEMENT AT  L3, L4, and L5  VERTEBRAL BODY LEVELS  Needle  placement was accomplished at L3, L4, and L5  vertebral body levels on the left side exactly as was accomplished at the L2  vertebral body level  and utilizing the same technique and  under fluoroscopic guidance.    Needle placement was then verified on lateral view at all levels with needle tip documented to be in the posterior superior quadrant of the intervertebral foramen of  L 2, L3, L4, and L. Following negative aspiration for heme and CSF at each level, each level was injected with 3 mL of 0.25% bupivacaine with Kenalog.  Myoneural block injections of the gluteal musculature region Following Betadine prep proposed entry site a 22-gauge needle was inserted in the gluteal musculature region and following negative aspiration 2 cc of 0.25% bupivacaine with Norflex was injected for myoneural block injection of the gluteal musculature region 2   The patient tolerated the procedure well A total of 10 mg of Kenalog was utilized for the procedure.   PLAN:  1. Medications: Will continue presently prescribed medications. 2. The patient is to undergo follow-up evaluation with PCP Dr. Lawerance BachBurns  for evaluation of blood pressure and general medical condition status post procedure performed on today's visit. 3. Surgical follow-up evaluation. 4. Neurological evaluation. 5. May consider radiofrequency procedures, implantation type procedures and other treatment pending response to treatment and follow-up evaluation. 6. The patient has been advise do adhere to proper body mechanics and avoid activities which may aggravate condition. 7. The patient has been advised to call the Pain Management Center prior to scheduled return appointment should there be significant change in the patient's condition or should the patient have other concerns regarding condition prior to scheduled return appointment.   Review of Systems     Objective:   Physical Exam  Assessment & Plan:

## 2014-11-20 NOTE — Progress Notes (Signed)
Safety precautions to be maintained throughout the outpatient stay will include: orient to surroundings, keep bed in low position, maintain call bell within reach at all times, provide assistance with transfer out of bed and ambulation.  

## 2014-11-20 NOTE — Progress Notes (Signed)
   Subjective:    Patient ID: Kelly Foster, female    DOB: 08/02/1950, 64 y.o.   MRN: [id number]  HPI    Review of Systems     Objective:   Physical Exam        Assessment & Plan:

## 2014-11-20 NOTE — Patient Instructions (Addendum)
PLAN  Continue present medication and begin taking antibiotic Ceftin as prescribed. Please obtain your antibiotic today and begin taking antibiotic Ceftin today  F/U PCP Dr. return or evaliation of  BP and general medical  condition.  F/U surgical evaluation. May consider pending follow-up evaluations  F/U neurological evaluation. May consider pending follow-up evaluations  May consider radiofrequency rhizolysis or intraspinal procedures pending response to present treatment and F/U evaluation.  Patient to call Pain Management Center should patient have concerns prior to scheduled return appointment.  Selective Nerve Root Block Patient Information  Description: Specific nerve roots exit the spinal canal and these nerves can be compressed and inflamed by a bulging disc and bone spurs.  By injecting steroids on the nerve root, we can potentially decrease the inflammation surrounding these nerves, which often leads to decreased pain.  Also, by injecting local anesthesia on the nerve root, this can provide us helpful information to give to your referring doctor if it decreases your pain.  Selective nerve root blocks can be done along the spine from the neck to the low back depending on the location of your pain.   After numbing the skin with local anesthesia, a small needle is passed to the nerve root and the position of the needle is verified using x-ray pictures.  After the needle is in correct position, we then deposit the medication.  You may experience a pressure sensation while this is being done.  The entire block usually lasts less than 15 minutes.  Conditions that may be treated with selective nerve root blocks:  Low back and leg pain  Spinal stenosis  Diagnostic block prior to potential surgery  Neck and arm pain  Post laminectomy syndrome  Preparation for the injection:  1. Do not eat any solid food or dairy products within 6 hours of your appointment. 2. You may drink clear  liquids up to 2 hours before an appointment.  Clear liquids include water, black coffee, juice or soda.  No milk or cream please. 3. You may take your regular medications, including pain medications, with a sip of water before your appointment.  Diabetics should hold regular insulin (if taken separately) and take 1/2 normal NPH dose the morning of the procedure.  Carry some sugar containing items with you to your appointment. 4. A driver must accompany you and be prepared to drive you home after your procedure. 5. Bring all your current medications with you. 6. An IV may be inserted and sedation may be given at the discretion of the physician. 7. A blood pressure cuff, EKG, and other monitors will often be applied during the procedure.  Some patients may need to have extra oxygen administered for a short period. 8. You will be asked to provide medical information, including allergies, prior to the procedure.  We must know immediately if you are taking blood  Thinners (like Coumadin) or if you are allergic to IV iodine contrast (dye).  Possible side-effects: All are usually temporary  Bleeding from needle site  Light headedness  Numbness and tingling  Decreased blood pressure  Weakness in arms/legs  Pressure sensation in back/neck  Pain at injection site (several days)  Possible complications: All are extremely rare  Infection  Nerve injury  Spinal headache (a headache wore with upright position)  Call if you experience:  Fever/chills associated with headache or increased back/neck pain  Headache worsened by an upright position  New onset weakness or numbness of an extremity below the injection site  Hives or difficulty breathing (go to the emergency room)  Inflammation or drainage at the injection site(s)  Severe back/neck pain greater than usual  New symptoms which are concerning to you  Please note:  Although the local anesthetic injected can often make your  back or neck feel good for several hours after the injection the pain will likely return.  It takes 3-5 days for steroids to work on the nerve root. You may not notice any pain relief for at least one week.  If effective, we will often do a series of 3 injections spaced 3-6 weeks apart to maximally decrease your pain.    If you have any questions, please call [phone] [facility] Discharge Instructions  General Discharge Instructions :  If you need to reach your doctor call: Monday-Friday 8:00 am - 4:00 pm at [phone] or toll free [phone].  After clinic hours [phone] to have operator reach doctor.  Bring all of your medication bottles to all your appointments in the pain clinic.  To cancel or reschedule your appointment with Pain Management please remember to call 24 hours in advance to avoid a fee.  Refer to the educational materials which you have been given on: General Risks, I had my Procedure. Discharge Instructions, Post Sedation.  Post Procedure Instructions:  The drugs you were given will stay in your system until tomorrow, so for the next 24 hours you should not drive, make any legal decisions or drink any alcoholic beverages.  You may eat anything you prefer, but it is better to start with liquids then soups and crackers, and gradually work up to solid foods.  Please notify your doctor immediately if you have any unusual bleeding, trouble breathing or pain that is not related to your normal pain.  Depending on the type of procedure that was done, some parts of your body may feel week and/or numb.  This usually clears up by tonight or the next day.  Walk with the use of an assistive device or accompanied by an adult for the 24 hours.  You may use ice on the affected area for the first 24 hours.  Put ice in a Ziploc bag and cover with a towel and place against area 15 minutes on 15 minutes off.  You may switch  to heat after 24 hours.

## 2014-11-21 ENCOUNTER — Telehealth: Payer: Self-pay | Admitting: *Deleted

## 2014-11-21 NOTE — Telephone Encounter (Signed)
No problems post procedure phone call. 

## 2014-11-24 ENCOUNTER — Telehealth: Payer: Self-pay | Admitting: *Deleted

## 2014-11-24 NOTE — Telephone Encounter (Signed)
Patient called to say she is urinating every 15 minutes and wants us to call her in a medication. Per Carollee HerterShannon no we can't patient is on Oxybutynin and it could be that med sending her into retention. Patient needs a nurse visit for a UA and PVR and then a follow up appt. Spoke with the patient and she states she will not be able to come in until next week. I let patient know that if she had to wait that long if she starts to have pain, fever, or can not urinate at all to go to the ER. Patient voiced understanding. Patient scheduled on the Nurse visit for Wednesday at 2.

## 2014-11-29 ENCOUNTER — Ambulatory Visit (INDEPENDENT_AMBULATORY_CARE_PROVIDER_SITE_OTHER): Payer: Medicare Other

## 2014-11-29 ENCOUNTER — Ambulatory Visit: Payer: Medicare Other

## 2014-11-29 DIAGNOSIS — R350 Frequency of micturition: Secondary | ICD-10-CM | POA: Diagnosis not present

## 2014-11-29 LAB — URINALYSIS, COMPLETE
Bilirubin, UA: NEGATIVE
GLUCOSE, UA: NEGATIVE
Nitrite, UA: NEGATIVE
PH UA: 5.5 (ref 5.0–7.5)
PROTEIN UA: NEGATIVE
RBC, UA: NEGATIVE
Specific Gravity, UA: 1.02 (ref 1.005–1.030)
Urobilinogen, Ur: 0.2 mg/dL (ref 0.2–1.0)

## 2014-11-29 LAB — MICROSCOPIC EXAMINATION
RBC, UA: NONE SEEN /hpf (ref 0–?)
Renal Epithel, UA: NONE SEEN /hpf

## 2014-11-29 LAB — BLADDER SCAN AMB NON-IMAGING: SCAN RESULT: 30

## 2014-11-29 NOTE — Progress Notes (Signed)
Pt came in today for a u/a and PVR. Pt c/o increased urinary frequency. Per Carollee HerterShannon pt urine was sent for cx and pt should increase oxybutynin to 2 tablets daily and f/u in 1 mo. Pt voiced understanding.

## 2014-11-30 ENCOUNTER — Telehealth: Payer: Self-pay

## 2014-11-30 NOTE — Telephone Encounter (Signed)
Pt wants pain meds she says she is in pain because of her procedure last week.

## 2014-12-01 ENCOUNTER — Telehealth: Payer: Self-pay

## 2014-12-01 LAB — CULTURE, URINE COMPREHENSIVE

## 2014-12-01 NOTE — Telephone Encounter (Signed)
-----   Message from Harle BattiestShannon A McGowan, PA-C sent at 12/01/2014 12:16 PM EDT ----- Her urine culture is negative

## 2014-12-01 NOTE — Telephone Encounter (Signed)
Spoke with pt in reference to ucx. Pt voiced understanding. Pt stated that since taking 2 oxybutynin she is doing much better.

## 2014-12-04 ENCOUNTER — Telehealth: Payer: Self-pay | Admitting: Pain Medicine

## 2014-12-04 NOTE — Telephone Encounter (Signed)
In a lot of pain , needs some kind of medication to help

## 2014-12-04 NOTE — Telephone Encounter (Signed)
Nurses Please call patient and described patient's pain to me Also check date of patient's appointment for evaluation and discuss with me so that we can discuss medications and or additional treatment

## 2014-12-04 NOTE — Telephone Encounter (Signed)
Nurses Please call patient and described patient's pain to me Also check patient's date of appointment so that we can discuss medications and or additional treatment

## 2014-12-04 NOTE — Telephone Encounter (Signed)
Attempted to call patient, message left. 

## 2014-12-05 NOTE — Telephone Encounter (Signed)
Pain has been described to Dr. Metta Clinesrisp. Please call patient for appt

## 2014-12-05 NOTE — Telephone Encounter (Signed)
Pain in left leg. Same pain as before the last injection. States not able to walk because of the pain. Next appt Nov. 15.

## 2014-12-05 NOTE — Telephone Encounter (Signed)
Nurses Call patient and described pain to me. Have patient go to the emergency room for evaluation today if necessary otherwise schedule patient for appointment for Monday or Wednesday, November 7 on November 9 for evaluation with injection to be considered

## 2014-12-06 ENCOUNTER — Other Ambulatory Visit: Payer: Self-pay

## 2014-12-06 DIAGNOSIS — N3281 Overactive bladder: Secondary | ICD-10-CM

## 2014-12-06 MED ORDER — OXYBUTYNIN CHLORIDE ER 5 MG PO TB24: 5.0000 mg | ORAL_TABLET | Freq: Every day | ORAL | Status: DC

## 2014-12-07 ENCOUNTER — Ambulatory Visit: Payer: Medicare Other | Attending: Pain Medicine | Admitting: Pain Medicine

## 2014-12-07 VITALS — BP 111/54 | HR 92 | Temp 98.3°F | Resp 18 | Ht 67.0 in | Wt 200.0 lb

## 2014-12-07 DIAGNOSIS — M533 Sacrococcygeal disorders, not elsewhere classified: Secondary | ICD-10-CM | POA: Insufficient documentation

## 2014-12-07 DIAGNOSIS — Z9889 Other specified postprocedural states: Secondary | ICD-10-CM | POA: Diagnosis not present

## 2014-12-07 DIAGNOSIS — M5030 Other cervical disc degeneration, unspecified cervical region: Secondary | ICD-10-CM

## 2014-12-07 DIAGNOSIS — G90512 Complex regional pain syndrome I of left upper limb: Secondary | ICD-10-CM | POA: Insufficient documentation

## 2014-12-07 DIAGNOSIS — M47816 Spondylosis without myelopathy or radiculopathy, lumbar region: Secondary | ICD-10-CM

## 2014-12-07 DIAGNOSIS — M545 Low back pain: Secondary | ICD-10-CM | POA: Diagnosis present

## 2014-12-07 DIAGNOSIS — M5416 Radiculopathy, lumbar region: Secondary | ICD-10-CM | POA: Insufficient documentation

## 2014-12-07 DIAGNOSIS — M5136 Other intervertebral disc degeneration, lumbar region: Secondary | ICD-10-CM

## 2014-12-07 DIAGNOSIS — M79605 Pain in left leg: Secondary | ICD-10-CM | POA: Diagnosis present

## 2014-12-07 DIAGNOSIS — M79604 Pain in right leg: Secondary | ICD-10-CM | POA: Diagnosis present

## 2014-12-07 DIAGNOSIS — M792 Neuralgia and neuritis, unspecified: Secondary | ICD-10-CM

## 2014-12-07 MED ORDER — OXYCODONE HCL 10 MG PO TABS: ORAL_TABLET | ORAL | Status: DC

## 2014-12-07 NOTE — Progress Notes (Signed)
   Subjective:    Patient ID: Kelly Foster, female    DOB: 08/15/1950, 64 y.o.   MRN: [id number]  HPI  The patient is a 64 year old female who returns to pain management for further evaluation and treatment of pain involving the lower back and lower extremity region. Patient states the pain radiates toward the lower extremity on the left greater than the right. Patient states that she is unable to stand erect due to severe pain which occurs with trying to stand erect. We will proceed with lumbar facet medial branch nerve blocks at time return appointment in attempt to decrease severity of symptoms, minimize progression of symptoms, and avoid  more involved treatment.      Review of Systems     Objective:   Physical Exam  There was mild tenderness of the splenius capitis at the talus musculature regions. Palpation over the acromioclavicular and glenohumeral joint regions reproduce mild discomfort. There was mild tenderness over the cervical facet cervical paraspinal musculature region and the thoracic facets and thoracic paraspinal musculature regions. No crepitus of the thoracic region was noted. Tinel and Phalen's maneuver without increased pain of significant degree and patient appeared to be slightly decreased grip strength.  Palpation over the lumbar paraspinal musculatures and lumbosacral region was moderately severe discomfort especially with lateral bending and rotation extension and palpation over the lumbar facets. Straight leg raising was decreased and tolerates approximately 20. Extension and palpation over the lumbar facets reproduced severe discomfort. There was tenderness over the region of the PSIS and the estrogen is a moderate degree. There was moderate tightness of the greater trochanteric region and iliotibial band region. No definite sensory deficit or dermatomal distribution was detected. DTRs were difficult to elicit. EHL strength was decreased. Abdomen nontender with no  costovertebral angle tenderness noted. 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 Please note patient was given prescription for oxycodone 10 mg. Limit one half to one tablet per day are twice per day if tolerated quantity of 50 pills. The patient was cautioned regarding respiratory depression confusion excessive sedation and other side effects which can occur with oxycodone and other medications   Lumbar facet, medial branch nerve blocks to be performed at time return appointment  F/U PCP Dr. Lawerance BachBurns for evaliation of  BP and general medical  condition  F/U surgical evaluation. May consider pending follow-up evaluations  F/U neurological evaluation. May consider pending follow-up evaluations  May consider radiofrequency rhizolysis or intraspinal procedures pending response to present treatment and F/U evaluation   Patient to call Pain Management Center should patient have concerns prior to scheduled return appointment.

## 2014-12-07 NOTE — Patient Instructions (Addendum)
PLAN   Continue present medications and limit oxycodone as prescribed. Caution oxycodone can cause respiratory depression confusion excessive sedation and other side effects  Lumbar facet, medial branch nerve blocks to be performed at time return appointment  F/U PCP Dr. Lawerance Bach for evaliation of  BP and general medical  condition.  F/U surgical evaluation. May consider pending follow-up evaluations  F/U neurological evaluation. May consider pending follow-up evaluations  May consider radiofrequency rhizolysis or intraspinal procedures pending response to present treatment and F/U evaluation.  Patient to call Pain Management Center should patient have concerns prior to scheduled return appointment.   Facet Joint Block The facet joints connect the bones of the spine (vertebrae). They make it possible for you to bend, twist, and make other movements with your spine. They also prevent you from overbending, overtwisting, and making other excessive movements.  A facet joint block is a procedure where a numbing medicine (anesthetic) is injected into a facet joint. Often, a type of anti-inflammatory medicine called a steroid is also injected. A facet joint block may be done for two reasons:   Diagnosis. A facet joint block may be done as a test to see whether neck or back pain is caused by a worn-down or infected facet joint. If the pain gets better after a facet joint block, it means the pain is probably coming from the facet joint. If the pain does not get better, it means the pain is probably not coming from the facet joint.   Therapy. A facet joint block may be done to relieve neck or back pain caused by a facet joint. A facet joint block is only done as a therapy if the pain does not improve with medicine, exercise programs, physical therapy, and other forms of pain management. LET [facility] CARE PROVIDER KNOW ABOUT:   Any allergies you have.   All medicines you are taking, including  vitamins, herbs, eyedrops, and over-the-counter medicines and creams.   Previous problems you or members of your family have had with the use of anesthetics.   Any blood disorders you have had.   Other health problems you have. RISKS AND COMPLICATIONS Generally, having a facet joint block is safe. However, as with any procedure, complications can occur. Possible complications associated with having a facet joint block include:   Bleeding.   Injury to a nerve near the injection site.   Pain at the injection site.   Weakness or numbness in areas controlled by nerves near the injection site.   Infection.   Temporary fluid retention.   Allergic reaction to anesthetics or medicines used during the procedure. BEFORE THE PROCEDURE   Follow your health care provider's instructions if you are taking dietary supplements or medicines. You may need to stop taking them or reduce your dosage.   Do not take any new dietary supplements or medicines without asking your health care provider first.   Follow your health care provider's instructions about eating and drinking before the procedure. You may need to stop eating and drinking several hours before the procedure.   Arrange to have an adult drive you home after the procedure. PROCEDURE  You may need to remove your clothing and dress in an open-back gown so that your health care provider can access your spine.   The procedure will be done while you are lying on an X-ray table. Most of the time you will be asked to lie on your stomach, but you may be asked to lie in  a different position if an injection will be made in your neck.   Special machines will be used to monitor your oxygen levels, heart rate, and blood pressure.   If an injection will be made in your neck, an intravenous (IV) tube will be inserted into one of your veins. Fluids and medicine will flow directly into your body through the IV tube.   The area over the  facet joint where the injection will be made will be cleaned with an antiseptic soap. The surrounding skin will be covered with sterile drapes.   An anesthetic will be applied to your skin to make the injection area numb. You may feel a temporary stinging or burning sensation.   A video X-ray machine will be used to locate the joint. A contrast dye may be injected into the facet joint area to help with locating the joint.   When the joint is located, an anesthetic medicine will be injected into the joint through the needle.   Your health care provider will ask you whether you feel pain relief. If you do feel relief, a steroid may be injected to provide pain relief for a longer period of time. If you do not feel relief or feel only partial relief, additional injections of an anesthetic may be made in other facet joints.   The needle will be removed, the skin will be cleansed, and bandages will be applied.  AFTER THE PROCEDURE   You will be observed for 15-30 minutes before being allowed to go home. Do not drive. Have an adult drive you or take a taxi or public transportation instead.   If you feel pain relief, the pain will return in several hours or days when the anesthetic wears off.   You may feel pain relief 2-14 days after the procedure. The amount of time this relief lasts varies from person to person.   It is normal to feel some tenderness over the injected area(s) for 2 days following the procedure.   If you have diabetes, you may have a temporary increase in blood sugar.   This information is not intended to replace advice given to you by your health care provider. Make sure you discuss any questions you have with your health care provider.   Document Released: 06/11/2006 Document Revised: 02/10/2014 Document Reviewed: 11/10/2011 Elsevier Interactive Patient Education 2016 [employer]  A prescription for OXYCODONE was given to you today.  STOP ASPIRIN 5 DAYS PRIOR  TO PROCEDURE.

## 2014-12-19 ENCOUNTER — Ambulatory Visit: Payer: Medicare Other | Admitting: Pain Medicine

## 2014-12-19 ENCOUNTER — Telehealth: Payer: Self-pay | Admitting: Pain Medicine

## 2014-12-19 NOTE — Telephone Encounter (Signed)
Patient is having hip operation and cannot keep appts for Nov 15 or 16 / she will call to resched if needed

## 2014-12-20 ENCOUNTER — Inpatient Hospital Stay
Admission: EM | Admit: 2014-12-20 | Discharge: 2014-12-28 | DRG: 470 | Disposition: A | Discharge status: Skilled Nursing Facility | Payer: Medicare Other | Attending: Internal Medicine | Admitting: Internal Medicine

## 2014-12-20 ENCOUNTER — Emergency Department: Payer: Medicare Other

## 2014-12-20 ENCOUNTER — Ambulatory Visit: Payer: Medicare Other | Admitting: Pain Medicine

## 2014-12-20 DIAGNOSIS — G510 Bell's palsy: Secondary | ICD-10-CM | POA: Diagnosis present

## 2014-12-20 DIAGNOSIS — F419 Anxiety disorder, unspecified: Secondary | ICD-10-CM | POA: Diagnosis present

## 2014-12-20 DIAGNOSIS — G8929 Other chronic pain: Secondary | ICD-10-CM | POA: Diagnosis present

## 2014-12-20 DIAGNOSIS — Z7982 Long term (current) use of aspirin: Secondary | ICD-10-CM

## 2014-12-20 DIAGNOSIS — M25552 Pain in left hip: Secondary | ICD-10-CM | POA: Diagnosis not present

## 2014-12-20 DIAGNOSIS — M8788 Other osteonecrosis, other site: Secondary | ICD-10-CM | POA: Diagnosis present

## 2014-12-20 DIAGNOSIS — E785 Hyperlipidemia, unspecified: Secondary | ICD-10-CM | POA: Diagnosis present

## 2014-12-20 DIAGNOSIS — G8918 Other acute postprocedural pain: Secondary | ICD-10-CM

## 2014-12-20 DIAGNOSIS — Z87891 Personal history of nicotine dependence: Secondary | ICD-10-CM

## 2014-12-20 DIAGNOSIS — I708 Atherosclerosis of other arteries: Secondary | ICD-10-CM | POA: Diagnosis present

## 2014-12-20 DIAGNOSIS — R52 Pain, unspecified: Secondary | ICD-10-CM | POA: Diagnosis present

## 2014-12-20 DIAGNOSIS — M1612 Unilateral primary osteoarthritis, left hip: Principal | ICD-10-CM | POA: Diagnosis present

## 2014-12-20 DIAGNOSIS — Z419 Encounter for procedure for purposes other than remedying health state, unspecified: Secondary | ICD-10-CM

## 2014-12-20 DIAGNOSIS — F319 Bipolar disorder, unspecified: Secondary | ICD-10-CM | POA: Diagnosis present

## 2014-12-20 DIAGNOSIS — R609 Edema, unspecified: Secondary | ICD-10-CM

## 2014-12-20 DIAGNOSIS — E871 Hypo-osmolality and hyponatremia: Secondary | ICD-10-CM | POA: Diagnosis present

## 2014-12-20 DIAGNOSIS — Z79899 Other long term (current) drug therapy: Secondary | ICD-10-CM

## 2014-12-20 DIAGNOSIS — K219 Gastro-esophageal reflux disease without esophagitis: Secondary | ICD-10-CM | POA: Diagnosis present

## 2014-12-20 HISTORY — DX: Pain, unspecified: R52

## 2014-12-20 LAB — CBC WITH DIFFERENTIAL/PLATELET
BASOS PCT: 0 %
Basophils Absolute: 0 10*3/uL (ref 0–0.1)
Eosinophils Absolute: 0 10*3/uL (ref 0–0.7)
Eosinophils Relative: 0 %
HEMATOCRIT: 34.8 % — AB (ref 35.0–47.0)
HEMOGLOBIN: 11.8 g/dL — AB (ref 12.0–16.0)
LYMPHS ABS: 1.5 10*3/uL (ref 1.0–3.6)
Lymphocytes Relative: 22 %
MCH: 28.9 pg (ref 26.0–34.0)
MCHC: 33.9 g/dL (ref 32.0–36.0)
MCV: 85.2 fL (ref 80.0–100.0)
MONOS PCT: 17 %
Monocytes Absolute: 1.1 10*3/uL — ABNORMAL HIGH (ref 0.2–0.9)
NEUTROS ABS: 4 10*3/uL (ref 1.4–6.5)
NEUTROS PCT: 61 %
Platelets: 419 10*3/uL (ref 150–440)
RBC: 4.09 MIL/uL (ref 3.80–5.20)
RDW: 15.1 % — ABNORMAL HIGH (ref 11.5–14.5)
WBC: 6.6 10*3/uL (ref 3.6–11.0)

## 2014-12-20 LAB — BASIC METABOLIC PANEL
Anion gap: 10 (ref 5–15)
BUN: <5 mg/dL — ABNORMAL LOW (ref 6–20)
CHLORIDE: 93 mmol/L — AB (ref 101–111)
CO2: 24 mmol/L (ref 22–32)
CREATININE: 0.57 mg/dL (ref 0.44–1.00)
Calcium: 8.7 mg/dL — ABNORMAL LOW (ref 8.9–10.3)
GFR calc non Af Amer: >60 mL/min (ref >60)
Glucose, Bld: 80 mg/dL (ref 65–99)
POTASSIUM: 3.5 mmol/L (ref 3.5–5.1)
Sodium: 127 mmol/L — ABNORMAL LOW (ref 135–145)

## 2014-12-20 MED ORDER — DIVALPROEX SODIUM ER 500 MG PO TB24
1000.0000 mg | ORAL_TABLET | Freq: Every day | ORAL | Status: DC
  Administered 2014-12-21 – 2014-12-27 (×7): 1000 mg via ORAL
  Filled 2014-12-20 (×9): qty 2

## 2014-12-20 MED ORDER — ACETAMINOPHEN 650 MG RE SUPP: 650.0000 mg | Freq: Four times a day (QID) | RECTAL | Status: DC | PRN

## 2014-12-20 MED ORDER — FENTANYL CITRATE (PF) 100 MCG/2ML IJ SOLN
25.0000 ug | Freq: Once | INTRAMUSCULAR | Status: AC
  Administered 2014-12-20: 25 ug via INTRAVENOUS
  Filled 2014-12-20: qty 2

## 2014-12-20 MED ORDER — ONDANSETRON HCL 4 MG PO TABS: 4.0000 mg | ORAL_TABLET | Freq: Four times a day (QID) | ORAL | Status: DC | PRN

## 2014-12-20 MED ORDER — POLYETHYLENE GLYCOL 3350 17 G PO PACK
17.0000 g | PACK | Freq: Every day | ORAL | Status: DC | PRN
  Administered 2014-12-20 – 2014-12-25 (×3): 17 g via ORAL
  Filled 2014-12-20 (×3): qty 1

## 2014-12-20 MED ORDER — OXYBUTYNIN CHLORIDE ER 5 MG PO TB24
5.0000 mg | ORAL_TABLET | Freq: Every day | ORAL | Status: DC
  Filled 2014-12-20: qty 1

## 2014-12-20 MED ORDER — OXYCODONE-ACETAMINOPHEN 5-325 MG PO TABS
1.0000 | ORAL_TABLET | Freq: Once | ORAL | Status: AC
  Administered 2014-12-20: 1 via ORAL
  Filled 2014-12-20: qty 1

## 2014-12-20 MED ORDER — DIPHENHYDRAMINE HCL 25 MG PO CAPS: 25.0000 mg | ORAL_CAPSULE | ORAL | Status: DC | PRN

## 2014-12-20 MED ORDER — TRAZODONE HCL 100 MG PO TABS
200.0000 mg | ORAL_TABLET | Freq: Every day | ORAL | Status: DC
  Administered 2014-12-21 – 2014-12-27 (×7): 200 mg via ORAL
  Filled 2014-12-20 (×8): qty 2

## 2014-12-20 MED ORDER — AZELASTINE HCL 0.1 % NA SOLN
1.0000 | Freq: Two times a day (BID) | NASAL | Status: DC
  Administered 2014-12-21 – 2014-12-28 (×14): 1 via NASAL
  Filled 2014-12-20: qty 30

## 2014-12-20 MED ORDER — MONTELUKAST SODIUM 10 MG PO TABS
10.0000 mg | ORAL_TABLET | Freq: Every day | ORAL | Status: DC
  Administered 2014-12-21 – 2014-12-27 (×7): 10 mg via ORAL
  Filled 2014-12-20 (×8): qty 1

## 2014-12-20 MED ORDER — ONDANSETRON HCL 4 MG/2ML IJ SOLN
4.0000 mg | Freq: Four times a day (QID) | INTRAMUSCULAR | Status: DC | PRN
  Administered 2014-12-26: 4 mg via INTRAVENOUS

## 2014-12-20 MED ORDER — SIMVASTATIN 40 MG PO TABS
40.0000 mg | ORAL_TABLET | Freq: Every day | ORAL | Status: DC
  Administered 2014-12-21 – 2014-12-27 (×7): 40 mg via ORAL
  Filled 2014-12-20 (×8): qty 1

## 2014-12-20 MED ORDER — ALBUTEROL SULFATE (2.5 MG/3ML) 0.083% IN NEBU: 3.0000 mL | INHALATION_SOLUTION | Freq: Four times a day (QID) | RESPIRATORY_TRACT | Status: DC | PRN

## 2014-12-20 MED ORDER — SODIUM CHLORIDE 0.9 % IV SOLN
INTRAVENOUS | Status: DC
  Administered 2014-12-20 – 2014-12-22 (×2): via INTRAVENOUS

## 2014-12-20 MED ORDER — HEPARIN SODIUM (PORCINE) 5000 UNIT/ML IJ SOLN
5000.0000 [IU] | Freq: Three times a day (TID) | INTRAMUSCULAR | Status: DC
  Administered 2014-12-20 – 2014-12-21 (×3): 5000 [IU] via SUBCUTANEOUS
  Filled 2014-12-20 (×3): qty 1

## 2014-12-20 MED ORDER — PANTOPRAZOLE SODIUM 40 MG PO TBEC
40.0000 mg | DELAYED_RELEASE_TABLET | Freq: Every day | ORAL | Status: DC
  Administered 2014-12-21 – 2014-12-28 (×7): 40 mg via ORAL
  Filled 2014-12-20 (×7): qty 1

## 2014-12-20 MED ORDER — MORPHINE SULFATE (PF) 2 MG/ML IV SOLN
2.0000 mg | INTRAVENOUS | Status: DC | PRN
  Administered 2014-12-20 – 2014-12-26 (×3): 2 mg via INTRAVENOUS
  Filled 2014-12-20 (×3): qty 1

## 2014-12-20 MED ORDER — FLUTICASONE PROPIONATE 50 MCG/ACT NA SUSP: 2.0000 | Freq: Every day | NASAL | Status: DC | PRN

## 2014-12-20 MED ORDER — OXYCODONE HCL 5 MG PO TABS
5.0000 mg | ORAL_TABLET | ORAL | Status: DC | PRN
  Administered 2014-12-21 – 2014-12-23 (×11): 5 mg via ORAL
  Filled 2014-12-20 (×12): qty 1

## 2014-12-20 MED ORDER — DOCUSATE SODIUM 100 MG PO CAPS
100.0000 mg | ORAL_CAPSULE | Freq: Two times a day (BID) | ORAL | Status: DC
Start: 2014-12-20 — End: 2014-12-22
  Administered 2014-12-20 – 2014-12-22 (×4): 100 mg via ORAL
  Filled 2014-12-20 (×4): qty 1

## 2014-12-20 MED ORDER — ACETAMINOPHEN 325 MG PO TABS
650.0000 mg | ORAL_TABLET | Freq: Four times a day (QID) | ORAL | Status: DC | PRN
  Administered 2014-12-28: 650 mg via ORAL
  Filled 2014-12-20: qty 2

## 2014-12-20 MED ORDER — RISPERIDONE 0.5 MG PO TABS
4.0000 mg | ORAL_TABLET | Freq: Every day | ORAL | Status: DC
  Administered 2014-12-21 – 2014-12-27 (×7): 4 mg via ORAL
  Filled 2014-12-20 (×8): qty 8

## 2014-12-20 MED ORDER — SERTRALINE HCL 50 MG PO TABS
200.0000 mg | ORAL_TABLET | Freq: Every day | ORAL | Status: DC
  Administered 2014-12-21 – 2014-12-27 (×7): 200 mg via ORAL
  Filled 2014-12-20 (×8): qty 4

## 2014-12-20 MED ORDER — DICLOFENAC SODIUM 50 MG PO TBEC
50.0000 mg | DELAYED_RELEASE_TABLET | Freq: Two times a day (BID) | ORAL | Status: DC | PRN
  Filled 2014-12-20: qty 1

## 2014-12-20 NOTE — ED Provider Notes (Signed)
[facility] Emergency Department Provider Note  ____________________________________________  Time seen: Seen upon arrival to the emergency department  I have reviewed the triage vital signs and the nursing notes.   HISTORY  Chief Complaint Hip Pain    HPI Kelly Foster is a 64 y.o. female with a history of arthritis and chronic left hip pain and was presenting with acutely worsening left hip pain over the last 3-4 days. She says that she is scheduled to go to [facility] to have her left hip replaced but does not have an appointment until November 23. She says that she is living at home alone and uses a cane and a walker but has been unable to use them over the past couple days because of her worsening pain. She says that she is unable to even get up to go to the bathroom or cook for herself. She denies any falls or other injuries to the left hip. She has not been taking any pain medication at this time. She says that she was taking Percocet but ran out. She says that this was helping for a period of time.she goes to [facility] and is in contact with them to get her set up with assisted living but this has not gone through at this time. she says that the pain is constant but worse with movement of the hip. It is especially bad with ambulation.    Past Medical History  Diagnosis Date  . Allergy   . Anxiety   . Hypertension   . Bipolar 1 disorder (HCC)   . Manic depressive disorder (HCC)   . Hyperlipidemia   . DJD (degenerative joint disease)   . Hip pain   . Prediabetes   . Arthritis   . Ulnar neuropathy   . Urinary frequency   . Overweight   . Vaginal atrophy     Patient Active Problem List   Diagnosis Date Noted  . Urinary frequency 07/29/2014  . Neuritis or radiculitis due to rupture of lumbar intervertebral disc 05/29/2014  . Spinal stenosis 11/29/2013  . Extremity numbness 11/29/2013  . Degenerative arthritis of hip 07/01/2013  . 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 Bipolar 1 disorder, mixed (HCC) 06/06/2013    Past Surgical History  Procedure Laterality Date  . Arm surgery Left   . Abdominal hysterectomy    . Cesarean section      Current Outpatient Rx  Name  Route  Sig  Dispense  Refill  . acetaminophen (TYLENOL) 325 MG tablet   Oral   Take 650 mg by mouth.         Marland Kitchen albuterol (PROVENTIL HFA;VENTOLIN HFA) 108 (90 BASE) MCG/ACT inhaler   Inhalation   Inhale into the lungs.         Marland Kitchen aspirin 325 MG tablet   Oral   Take 325 mg by mouth daily.         . bisacodyl (STIMULANT LAXATIVE) 5 MG EC tablet   Oral   Take by mouth.         . Calcium-Vitamin D 600-200 MG-UNIT per tablet   Oral   Take by mouth.         . Cetirizine HCl 10 MG CAPS   Oral   Take by mouth.         . diclofenac (CATAFLAM) 50 MG tablet   Oral   Take 50 mg by mouth.         Marland Kitchen  divalproex (DEPAKOTE) 500 MG DR tablet   Oral   Take 500 mg by mouth at bedtime. Takes 2 tablets at night         . docusate sodium (STOOL SOFTENER) 100 MG capsule   Oral   Take 100 mg by mouth.         . mometasone (NASONEX) 50 MCG/ACT nasal spray      2 sprays by Each Nare route daily.         . Multiple Vitamin (MULTI-VITAMINS) TABS   Oral   Take by mouth.         . nystatin 100000 UNITS vaginal tablet   Vaginal   Place vaginally.         Marland Kitchen olopatadine (PATANOL) 0.1 % ophthalmic solution   Both Eyes   Place 1 drop into both eyes once.         Marland Kitchen oxybutynin (DITROPAN-XL) 5 MG 24 hr tablet   Oral   Take 1 tablet (5 mg total) by mouth daily.   60 tablet   3   . Oxycodone HCl 10 MG TABS      Limit 1/2  - 1  tab by mouth per day or twice per day if tolerated   (Limit 50 per month)   50 tablet   0   . Polyethyl Glycol-Propyl Glycol 0.4-0.3 % SOLN   Ophthalmic   Apply to eye.         Marland Kitchen RisperiDONE (RISPERDAL PO)   Oral   Take by mouth at bedtime.         . sertraline (ZOLOFT) 100 MG  tablet   Oral   Take 200 mg by mouth.         . simvastatin (ZOCOR) 40 MG tablet   Oral   Take 40 mg by mouth daily.           Allergies Codeine and Tramadol  Family History  Problem Relation Age of Onset  . Stroke Father   . Colon cancer Sister   . Alzheimer's disease Mother   . Kidney disease Neg Hx     Social History Social History  Substance Use Topics  . Smoking status: Former Smoker -- 1.00 packs/day    Types: Cigarettes    Quit date: 03/01/1995  . Smokeless tobacco: Never Used     Comment: quit 1998  . Alcohol Use: No    Review of Systems Constitutional: No fever/chills Eyes: No visual changes. ENT: No sore throat. Cardiovascular: Denies chest pain. Respiratory: Denies shortness of breath. Gastrointestinal: No abdominal pain.  No nausea, no vomiting.  No diarrhea.  No constipation. Genitourinary: Negative for dysuria. Musculoskeletal:  Acute on chronic left hip pain.     Skin: Negative for rash. Neurological: Negative for headaches, focal weakness or numbness.  10-point ROS otherwise negative.  ____________________________________________   PHYSICAL EXAM:  VITAL SIGNS: ED Triage Vitals  Enc Vitals Group     BP 12/20/14 1604 132/79 mmHg     Pulse Rate 12/20/14 1604 102     Resp 12/20/14 1604 18     Temp 12/20/14 1604 98 F (36.7 C)     Temp Source 12/20/14 1604 Oral     SpO2 12/20/14 1604 100 %     Weight 12/20/14 1604 200 lb (90.719 kg)     Height 12/20/14 1604  (1.702 m)     Head Cir --      Peak Flow --      Pain Score  12/20/14 1604 10     Pain Loc --      Pain Edu? --      Excl. in GC? --     Constitutional: Alert and oriented. Well appearing and in no acute distress. Eyes: Conjunctivae are normal. PERRL. EOMI. Head: Atraumatic. Nose: No congestion/rhinnorhea. Mouth/Throat: Mucous membranes are moist.  Neck: No stridor.   Cardiovascular: Normal rate, regular rhythm. Grossly normal heart sounds.  Good peripheral  circulation. Respiratory: Normal respiratory effort.  No retractions. Lungs CTAB. Gastrointestinal: Soft and nontender. No distention. No abdominal bruits. No CVA tenderness. Musculoskeletal: Bilateral lower extremity edema which the patient says is chronic and unchanged. She has tenderness diffusely to palpation to the left hip. There is no redness or warmth. There is no obvious swelling. There is severe pain with passive range of motion. Active range of motion is severely limited secondary to pain. There is no saddle anesthesia. She is sensate throughout the lower extremities bilaterally. Neurologic:  Normal speech and language. No gross focal neurologic deficits are appreciated. No gait instability. Skin:  Skin is warm, dry and intact. No rash noted. Psychiatric: Mood and affect are normal. Speech and behavior are normal.  ____________________________________________   LABS (all labs ordered are listed, but only abnormal results are displayed)  Labs Reviewed - No data to display ____________________________________________  EKG   ____________________________________________  RADIOLOGY  ----------------------------------------- 8:53 PM on 12/20/2014 -----------------------------------------  Very severe chronic left hip joint degeneration. 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 No acute abnormality. ____________________________________________   PROCEDURES  ____________________________________________   INITIAL IMPRESSION / ASSESSMENT AND PLAN / ED COURSE  Pertinent labs & imaging results that were available during my care of the patient were reviewed by me and considered in my medical decision making (see chart for details).   Social work contacted to discuss further options for the patient.  ----------------------------------------- 8:53 PM on 12/20/2014 -----------------------------------------  Social work is available to the patient and she does not  have insurance resources for placement at this time. The patient Percocet and then 2 doses, separately, fentanyl. I reassessed her in between each dose of pain medication and she has not been able to passively or actively move her left lower extremity and hip secondary to severe pain to the left hip. I attempted to sit the patient up and attempted to get out of bed and this was also unsuccessful secondary to pain. I will admit her to the hospital for refractory pain as well as for rehabilitation evaluation. Signed out to Dr. Amado CoeGouru.    ____________________________________________   FINAL CLINICAL IMPRESSION(S) / ED DIAGNOSES  Acute on chronic left hip pain.    Myrna Blazeravid Matthew Tonantzin Mimnaugh, MD 12/20/14 [phone]

## 2014-12-20 NOTE — ED Notes (Signed)
Patient brought in from home via EMS for left hip pain.  Patient denies fall or trauma.  Patient verbalized that she was scheduled [facility] in one month.

## 2014-12-20 NOTE — H&P (Signed)
[facility] [place] at [facility]   PATIENT NAME: Kelly Foster    MR#:  [id number]  DATE OF BIRTH:  04/28/1950   DATE OF ADMISSION:  12/20/2014  PRIMARY CARE PHYSICIAN: Hyman HopesBurns, Harriett P, MD   REQUESTING/REFERRING PHYSICIAN: Schaevitz  CHIEF COMPLAINT:   Chief Complaint  Patient presents with  . Hip Pain    HISTORY OF PRESENT ILLNESS:  Kelly Mareseggy Dinse  is a 64 y.o. female with a known history of degenerative joint disease, osteoarthritis left hip who is presenting with worsening left hip pain. She states for approximately 3 days now due to worsening pain she is unable to ambulate and therefore care for herself. She describes the pain as only "pain" 25/10 intensity as she is calmly eating a [place] sandwich, nonradiating worse with movement no relieving factors. Despite this she has received 3 doses of pain medication without significant relief she is still unable to ambulate. She also has pain with passive range of motion. Of note she is supposed to have hip [facility] performed [facility] in approximately one month. In the emergency department social work was consult in the hopes of getting her discharge from the emergency department however they were unsuccessful.  PAST MEDICAL HISTORY:   Past Medical History  Diagnosis Date  . Allergy   . Anxiety   . Hypertension   . Bipolar 1 disorder (HCC)   . Manic depressive disorder (HCC)   . Hyperlipidemia   . DJD (degenerative joint disease)   . Hip pain   . Prediabetes   . Arthritis   . Ulnar neuropathy   . Urinary frequency   . Overweight   . Vaginal atrophy     PAST SURGICAL HISTORY:   Past Surgical History  Procedure Laterality Date  . Arm surgery Left   . Abdominal hysterectomy    . Cesarean section      SOCIAL HISTORY:   Social History  Substance Use Topics  . Smoking status: Former Smoker -- 1.00 packs/day    Types: Cigarettes    Quit date: 03/01/1995  . Smokeless tobacco: Never Used      Comment: quit 1998  . Alcohol Use: No    FAMILY HISTORY:   Family History  Problem Relation Age of Onset  . Stroke Father   . Colon cancer Sister   . Alzheimer's disease Mother   . Kidney disease Neg Hx     DRUG ALLERGIES:   Allergies  Allergen Reactions  . Codeine Itching  . Tramadol Itching    REVIEW OF SYSTEMS:  REVIEW OF SYSTEMS:  CONSTITUTIONAL: Denies fevers, chills, fatigue, weakness.  EYES: Denies blurred vision, double vision, or eye pain.  EARS, NOSE, THROAT: Denies tinnitus, ear pain, hearing loss.  RESPIRATORY: denies cough, shortness of breath, wheezing  CARDIOVASCULAR: Denies chest pain, palpitations, edema.  GASTROINTESTINAL: Denies nausea, vomiting, diarrhea, abdominal pain.  GENITOURINARY: Denies dysuria, hematuria.  ENDOCRINE: Denies nocturia or thyroid problems. HEMATOLOGIC AND LYMPHATIC: Denies easy bruising or bleeding.  SKIN: Denies rash or lesions.  MUSCULOSKELETAL: Denies pain in neck, back, shoulder, knees,, positive left hip pain NEUROLOGIC: Denies paralysis, paresthesias.  PSYCHIATRIC: Denies anxiety or depressive symptoms. Otherwise full review of systems performed by me is negative.   MEDICATIONS AT HOME:   Prior to Admission medications   Medication Sig Start Date End Date Taking? Authorizing Provider  acetaminophen (TYLENOL) 650 MG CR tablet Take 650-1,300 mg by mouth every 8 (eight) hours as needed for pain.   Yes Historical  Provider, MD  acyclovir (ZOVIRAX) 400 MG tablet Take 400 mg by mouth 3 (three) times daily as needed (for flares).   Yes Historical Provider, MD  albuterol (PROVENTIL HFA;VENTOLIN HFA) 108 (90 BASE) MCG/ACT inhaler Inhale 2 puffs into the lungs every 6 (six) hours as needed for wheezing or shortness of breath.   Yes Historical Provider, MD  azelastine (ASTELIN) 0.1 % nasal spray Place 1 spray into both nostrils 2 (two) times daily.   Yes Historical Provider, MD  diclofenac (VOLTAREN) 50 MG EC tablet Take 50 mg  by mouth 2 (two) times daily as needed for mild pain.   Yes Historical Provider, MD  divalproex (DEPAKOTE ER) 500 MG 24 hr tablet Take 1,000 mg by mouth at bedtime.   Yes Historical Provider, MD  fluticasone (FLONASE) 50 MCG/ACT nasal spray Place 2 sprays into both nostrils daily as needed for rhinitis.   Yes Historical Provider, MD  montelukast (SINGULAIR) 10 MG tablet Take 10 mg by mouth at bedtime.   Yes Historical Provider, MD  omeprazole (PRILOSEC) 40 MG capsule Take 40 mg by mouth daily.   Yes Historical Provider, MD  oxybutynin (DITROPAN-XL) 5 MG 24 hr tablet Take 5 mg by mouth at bedtime.   Yes Historical Provider, MD  Oxycodone HCl 10 MG TABS Take 5-10 mg by mouth 2 (two) times daily as needed (for pain).   Yes Historical Provider, MD  risperidone (RISPERDAL) 4 MG tablet Take 4 mg by mouth at bedtime.   Yes Historical Provider, MD  sertraline (ZOLOFT) 100 MG tablet Take 200 mg by mouth at bedtime.   Yes Historical Provider, MD  simvastatin (ZOCOR) 40 MG tablet Take 40 mg by mouth at bedtime.    Yes Historical Provider, MD  traZODone (DESYREL) 100 MG tablet Take 200 mg by mouth at bedtime.   Yes Historical Provider, MD      VITAL SIGNS:  Blood pressure 132/79, pulse 102, temperature 98 F (36.7 C), temperature source Oral, resp. rate 18, height  (1.702 m), weight 200 lb (90.719 kg), SpO2 100 %.  PHYSICAL EXAMINATION:  VITAL SIGNS: Filed Vitals:   12/20/14 1604  BP: 132/79  Pulse: 102  Temp: 98 F (36.7 C)  Resp: 18   GENERAL:64 y.o.female currently in no acute distress.  HEAD: Normocephalic, atraumatic.  EYES: Pupils equal, round, reactive to light. Extraocular muscles intact. No scleral icterus.  MOUTH: Moist mucosal membrane. Dentition intact. No abscess noted.  EAR, NOSE, THROAT: Clear without exudates. No external lesions.  NECK: Supple. No thyromegaly. No nodules. No JVD.  PULMONARY: Clear to ascultation, without wheeze rails or rhonci. No use of accessory muscles,  Good respiratory effort. good air entry bilaterally CHEST: Nontender to palpation.  CARDIOVASCULAR: S1 and S2. Regular rate and rhythm. No murmurs, rubs, or gallops. No edema. Pedal pulses 2+ bilaterally.  GASTROINTESTINAL: Soft, nontender, nondistended. No masses. Positive bowel sounds. No hepatosplenomegaly.  MUSCULOSKELETAL: No swelling, clubbing, or edema. Range of motion limited in left lower extremity due to pain NEUROLOGIC: Cranial nerves II through XII are intact. No gross focal neurological deficits. Sensation intact. Reflexes intact.  SKIN: No ulceration, lesions, rashes, or cyanosis. Skin warm and dry. Turgor intact.  PSYCHIATRIC: Mood, affect within normal limits. The patient is awake, alert and oriented x 3. Insight, judgment intact.    LABORATORY PANEL:   CBC  Recent Labs Lab 12/20/14 1601  WBC 6.6  HGB 11.8*  HCT 34.8*  PLT 419   ------------------------------------------------------------------------------------------------------------------  Chemistries   Recent Labs  Lab 12/20/14 1601  NA 127*  K 3.5  CL 93*  CO2 24  GLUCOSE 80  BUN <5*  CREATININE 0.57  CALCIUM 8.7*   ------------------------------------------------------------------------------------------------------------------  Cardiac Enzymes No results for input(s): TROPONINI in the last 168 hours. ------------------------------------------------------------------------------------------------------------------  RADIOLOGY:  Dg Hip Unilat With Pelvis 2-3 Views Left  12/20/2014  CLINICAL DATA:  64 year old female with left hip pain, no recent injury. Scheduled for hip replacement soon. Subsequent encounter. EXAM: DG HIP (WITH OR WITHOUT PELVIS) 2-3V LEFT COMPARISON:  None. FINDINGS: Very severe left hip joint space loss with chronic severe remodeling of the left femoral head. Subchondral sclerosis and osteophytosis. Proximal left femur is intact. Moderate to severe hip joint space loss on the right  with osteophytosis and subchondral sclerosis. No acute fracture of the pelvis. Calcified iliac artery atherosclerosis. Pelvic phleboliths. 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 No acute osseous abnormality identified. Electronically Signed   By: Odessa Fleming M.D.   On: 12/20/2014 16:37    EKG:   Orders placed or performed in visit on 02/23/14  . EKG 12-Lead    IMPRESSION AND PLAN:   64 year old African-American female history of osteoarthritis who is presenting with worsening hip pain.  1. Intractable pain, left hip/degenerative joint disease: Provide pain medication, bowel regimen, physical therapy, social work 2. Hyponatremia: Appears euvolemic, we'll check urine studies as well as TSH provide IV fluid hydration normal saline and follow sodium levels 3. GERD without esophagitis: PPI therapy 4. Hyperlipidemia unspecified: Statin therapy 5. Venous thrombus embolism prophylactic: Heparin subcutaneous    All the records are reviewed and case discussed with ED provider. Management plans discussed with the patient, family and they are in agreement.  CODE STATUS: Full  TOTAL TIME TAKING CARE OF THIS PATIENT: 35 minutes.    Kuba Shepherd,  Mardi Mainland.D on 12/20/2014 at 9:19 PM  Between 7am to 6pm - Pager - [phone]  After 6pm: House Pager: - [phone]  [facility]  Office  [phone]  CC: Primary care physician; Hyman Hopes, MD

## 2014-12-20 NOTE — Progress Notes (Signed)
CSW was consulted to see Pt for possible placement by EDP due to Pt not being able to take care of herself for the last 3 days. CSW saw Pt briefly in ED. 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 Pt denies any home health services at home. [facility] CSW advised that is a SNF and Pt does not have benefit for ALF or SNF at this time. Pt will need to apply for medicaid for ALF level of care. [facility] is [facility].  CSW updated EDP with lack of placement options due to inadequate insurance for this type of situation.   Wilford Gristara Shuree Brossart, LCSW [phone]

## 2014-12-21 ENCOUNTER — Observation Stay: Payer: Medicare Other

## 2014-12-21 LAB — BASIC METABOLIC PANEL
ANION GAP: 8 (ref 5–15)
BUN: 5 mg/dL — AB (ref 6–20)
CO2: 24 mmol/L (ref 22–32)
CREATININE: 0.55 mg/dL (ref 0.44–1.00)
Calcium: 8.5 mg/dL — ABNORMAL LOW (ref 8.9–10.3)
Chloride: 100 mmol/L — ABNORMAL LOW (ref 101–111)
GFR calc non Af Amer: >60 mL/min (ref >60)
GLUCOSE: 108 mg/dL — AB (ref 65–99)
POTASSIUM: 3.6 mmol/L (ref 3.5–5.1)
Sodium: 132 mmol/L — ABNORMAL LOW (ref 135–145)

## 2014-12-21 LAB — SODIUM, URINE, RANDOM: Sodium, Ur: 63 mmol/L

## 2014-12-21 LAB — TSH: TSH: 1.078 u[IU]/mL (ref 0.350–4.500)

## 2014-12-21 LAB — OSMOLALITY, URINE: Osmolality, Ur: 374 mOsm/kg (ref 300–900)

## 2014-12-21 MED ORDER — OXYBUTYNIN CHLORIDE 5 MG PO TABS
5.0000 mg | ORAL_TABLET | Freq: Two times a day (BID) | ORAL | Status: DC
  Administered 2014-12-21 – 2014-12-28 (×14): 5 mg via ORAL
  Filled 2014-12-21 (×14): qty 1

## 2014-12-21 MED ORDER — ENOXAPARIN SODIUM 40 MG/0.4ML ~~LOC~~ SOLN
40.0000 mg | SUBCUTANEOUS | Status: DC
  Administered 2014-12-22 – 2014-12-24 (×3): 40 mg via SUBCUTANEOUS
  Filled 2014-12-21 (×3): qty 0.4

## 2014-12-21 NOTE — [place] FL2 (Signed)
[place] MEDICAID FL2 LEVEL OF CARE SCREENING TOOL     IDENTIFICATION  Patient Name: Kelly Foster Birthdate: [birth year] Sex: female Admission Date (Current Location): 12/20/2014  [facility] and [place] Number: Chiropodist and Address:  [facility], [address], [place], [place] [address]      Provider Number: [id number]  Attending Physician Name and Address:  Delfino Lovett, MD  Relative Name and Phone Number:       Current Level of Care: Hospital Recommended Level of Care: Skilled Nursing Facility Prior Approval Number:    Date Approved/Denied:   PASRR Number:    Discharge Plan: SNF    Current Diagnoses: Patient Active Problem List   Diagnosis Date Noted  . Intractable pain 12/20/2014  . Urinary frequency 07/29/2014  . Neuritis or radiculitis due to rupture of lumbar intervertebral disc 05/29/2014  . Spinal stenosis 11/29/2013  . Extremity numbness 11/29/2013  . Degenerative arthritis of hip 07/01/2013  . 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 Bipolar 1 disorder, mixed (HCC) 06/06/2013    Orientation ACTIVITIES/SOCIAL BLADDER RESPIRATION    Self, Time, Situation, Place  Active Incontinent Normal  BEHAVIORAL SYMPTOMS/MOOD NEUROLOGICAL BOWEL NUTRITION STATUS   (none)  (none) Continent Diet  PHYSICIAN VISITS COMMUNICATION OF NEEDS Height & Weight Skin  30 days Verbally   200 lbs. Normal          AMBULATORY STATUS RESPIRATION    Supervision limited Normal      Personal Care Assistance Level of Assistance  Bathing, Dressing Bathing Assistance: Limited assistance   Dressing Assistance: Limited assistance      Functional Limitations Info                SPECIAL CARE FACTORS FREQUENCY  PT (By licensed PT)                   Additional Factors Info  Code Status, Allergies Code Status Info: Full Allergies Info: codeine tramadol           Current Medications (12/21/2014): Current  Facility-Administered Medications  Medication Dose Route Frequency Provider Last Rate Last Dose  . 0.9 %  sodium chloride infusion   Intravenous Continuous Wyatt Haste, MD 100 mL/hr at 12/20/14 2225    . acetaminophen (TYLENOL) tablet 650 mg  650 mg Oral Q6H PRN Wyatt Haste, MD       Or  . acetaminophen (TYLENOL) suppository 650 mg  650 mg Rectal Q6H PRN Wyatt Haste, MD      . albuterol (PROVENTIL) (2.5 MG/3ML) 0.083% nebulizer solution 3 mL  3 mL Inhalation Q6H PRN Wyatt Haste, MD      . azelastine (ASTELIN) 0.1 % nasal spray 1 spray  1 spray Each Nare BID Wyatt Haste, MD   1 spray at 12/21/14 [phone]  . diclofenac (VOLTAREN) EC tablet 50 mg  50 mg Oral BID PRN Wyatt Haste, MD      . diphenhydrAMINE (BENADRYL) capsule 25 mg  25 mg Oral Q4H PRN Wyatt Haste, MD      . divalproex (DEPAKOTE ER) 24 hr tablet 1,000 mg  1,000 mg Oral QHS Wyatt Haste, MD   1,000 mg at 12/20/14 2341  . docusate sodium (COLACE) capsule 100 mg  100 mg Oral BID Wyatt Haste, MD   100 mg at 12/21/14 0844  . fluticasone (FLONASE) 50 MCG/ACT nasal spray 2 spray  2 spray Each Nare Daily PRN Wyatt Haste,  MD      . heparin injection 5,000 Units  5,000 Units Subcutaneous 3 times per day Wyatt Hasteavid K Hower, MD   5,000 Units at 12/21/14 1412  . montelukast (SINGULAIR) tablet 10 mg  10 mg Oral QHS Wyatt Hasteavid K Hower, MD   10 mg at 12/20/14 2341  . morphine 2 MG/ML injection 2 mg  2 mg Intravenous Q4H PRN Wyatt Hasteavid K Hower, MD   2 mg at 12/21/14 0433  . ondansetron (ZOFRAN) tablet 4 mg  4 mg Oral Q6H PRN Wyatt Hasteavid K Hower, MD       Or  . ondansetron [facility](ZOFRAN) injection 4 mg  4 mg Intravenous Q6H PRN Wyatt Hasteavid K Hower, MD      . oxybutynin (DITROPAN) tablet 5 mg  5 mg Oral BID Delfino LovettVipul Shah, MD   5 mg at 12/21/14 1057  . oxybutynin (DITROPAN-XL) 24 hr tablet 5 mg  5 mg Oral QHS Wyatt Hasteavid K Hower, MD   5 mg at 12/20/14 2342  . oxyCODONE (Oxy IR/ROXICODONE) immediate release tablet 5 mg  5 mg Oral Q4H PRN Wyatt Hasteavid K Hower, MD   5 mg at 12/21/14 1412  .  pantoprazole (PROTONIX) EC tablet 40 mg  40 mg Oral Daily Wyatt Hasteavid K Hower, MD   40 mg at 12/21/14 0844  . polyethylene glycol (MIRALAX / GLYCOLAX) packet 17 g  17 g Oral Daily PRN Wyatt Hasteavid K Hower, MD   17 g at 12/20/14 2344  . risperiDONE (RISPERDAL) tablet 4 mg  4 mg Oral QHS Wyatt Hasteavid K Hower, MD   4 mg at 12/20/14 2342  . sertraline (ZOLOFT) tablet 200 mg  200 mg Oral QHS Wyatt Hasteavid K Hower, MD   200 mg at 12/20/14 2343  . simvastatin (ZOCOR) tablet 40 mg  40 mg Oral QHS Wyatt Hasteavid K Hower, MD   40 mg at 12/20/14 2343  . traZODone (DESYREL) tablet 200 mg  200 mg Oral QHS Wyatt Hasteavid K Hower, MD   200 mg at 12/20/14 2343   Do not use this list as official medication orders. Please verify with discharge summary.  Discharge Medications:   Medication List    ASK your doctor about these medications        acetaminophen 650 MG CR tablet  Commonly known as:  TYLENOL  Take 650-1,300 mg by mouth every 8 (eight) hours as needed for pain.     acyclovir 400 MG tablet  Commonly known as:  ZOVIRAX  Take 400 mg by mouth 3 (three) times daily as needed (for flares).     albuterol 108 (90 BASE) MCG/ACT inhaler  Commonly known as:  PROVENTIL HFA;VENTOLIN HFA  Inhale 2 puffs into the lungs every 6 (six) hours as needed for wheezing or shortness of breath.     azelastine 0.1 % nasal spray  Commonly known as:  ASTELIN  Place 1 spray into both nostrils 2 (two) times daily.     diclofenac 50 MG EC tablet  Commonly known as:  VOLTAREN  Take 50 mg by mouth 2 (two) times daily as needed for mild pain.     divalproex 500 MG 24 hr tablet  Commonly known as:  DEPAKOTE ER  Take 1,000 mg by mouth at bedtime.     fluticasone 50 MCG/ACT nasal spray  Commonly known as:  FLONASE  Place 2 sprays into both nostrils daily as needed for rhinitis.     montelukast 10 MG tablet  Commonly known as:  SINGULAIR  Take 10 mg by mouth at bedtime.  omeprazole 40 MG capsule  Commonly known as:  PRILOSEC  Take 40 mg by mouth daily.      oxybutynin 5 MG 24 hr tablet  Commonly known as:  DITROPAN-XL  Take 5 mg by mouth at bedtime.     Oxycodone HCl 10 MG Tabs  Take 5-10 mg by mouth 2 (two) times daily as needed (for pain).     risperidone 4 MG tablet  Commonly known as:  RISPERDAL  Take 4 mg by mouth at bedtime.     sertraline 100 MG tablet  Commonly known as:  ZOLOFT  Take 200 mg by mouth at bedtime.     simvastatin 40 MG tablet  Commonly known as:  ZOCOR  Take 40 mg by mouth at bedtime.     traZODone 100 MG tablet  Commonly known as:  DESYREL  Take 200 mg by mouth at bedtime.        Relevant Imaging Results:  Relevant Lab Results:  Recent Labs    Additional Information SS: [id number]  York Spaniel, LCSW

## 2014-12-21 NOTE — Evaluation (Signed)
Physical Therapy Evaluation Patient Details Name: Kelly Foster MRN: [id number] DOB: 09/11/1950 Today's Date: 12/21/2014   History of Present Illness  Pt is a 64 yo female who was admitted to the hospital due to intractable L hip pain. Pt is unable to take care of herself or ambulate secondary to pain. She [facility]  Clinical Impression  Pt presents with hx of HTN, bipolar, DJD, ulnar neuropathy, and obesity. Examination reveals that pt is mod A for bed mobility, mod-max A +2 for transfers, and is unable to perform ambulation secondary to pain. Strength is difficult to assess secondary to pt pain. Her primary physical deficit appears to be acute pain, as she was relatively independent prior to admission. Pt is currently not safe to return home given her mobility deficits brought on by L hip pain. Pt has no easing or relieving factors. Due to these deficits, pt will benefit from skilled PT for prevention of functional decline and eventual return to premorbid state.     Follow Up Recommendations SNF    Equipment Recommendations   (TBD)    Recommendations for Other Services  (Orthopedics consult: may be appropriate for pain)     Precautions / Restrictions Precautions Precautions: Fall Restrictions Weight Bearing Restrictions: No      Mobility  Bed Mobility Overal bed mobility: Needs Assistance;+2 for physical assistance Bed Mobility: Supine to Sit     Supine to sit: Mod assist     General bed mobility comments: Pt needs assist for management of LEs and cues for hand placement in order to help her pull on side rails and then push on bed to maintain sittin. Needs minor assist for trunk getting upright into sitting   Transfers Overall transfer level: Needs assistance Equipment used: Rolling walker (2 wheeled) Transfers: Sit to/from [employer]Stand;Stand Pivot Transfers Sit to Stand: Mod assist;+2 safety/equipment Stand pivot transfers: Max assist;+2  safety/equipment       General transfer comment: Pt requires assist to get up into standing. Pt only able to stand for about 20 seconds before needing to sit again. She requires more assist with the stand-pivot transfer as she is unable to advance feet in standing secondary to pain. Needs cues to push through walker in order to off load L hip  Ambulation/Gait Ambulation/Gait assistance:  (Not able/unsafe secondary to pain and instability )              Stairs            Wheelchair Mobility    Modified Rankin (Stroke Patients Only)       Balance Overall balance assessment: No apparent balance deficits (not formally assessed)                                           Pertinent Vitals/Pain Pain Assessment: 0-10 Pain Score: 10-Worst pain ever Pain Location: L hip Pain Descriptors / Indicators: Constant Pain Intervention(s): Limited activity within patient's tolerance;Monitored during session;Premedicated before session    Home Living Family/patient expects to be discharged to:: Skilled nursing facility Living Arrangements: Alone               Additional Comments: Pt lives alone with no available assistance at discharge.     Prior Function Level of Independence: Independent with assistive device(s)         Comments: Pt able to  ambulate community distances with rollator walker or cane. Independent with ADLs     Hand Dominance        Extremity/Trunk Assessment   Upper Extremity Assessment: Overall WFL for tasks assessed           Lower Extremity Assessment:  (difficult to assess secondary to pain)         Communication   Communication: No difficulties  Cognition Arousal/Alertness: Awake/alert Behavior During Therapy: WFL for tasks assessed/performed Overall Cognitive Status: Within Functional Limits for tasks assessed                      General Comments      Exercises        Assessment/Plan    PT  Assessment Patient needs continued PT services  PT Diagnosis Abnormality of gait;Difficulty walking;Acute pain   PT Problem List Decreased activity tolerance;Decreased mobility;Pain  PT Treatment Interventions DME instruction;Gait training;Stair training;Functional mobility training;Therapeutic activities;Therapeutic exercise;Balance training;Neuromuscular re-education   PT Goals (Current goals can be found in the Care Plan section) Acute Rehab PT Goals Patient Stated Goal: none stated PT Goal Formulation: With patient Time For Goal Achievement: 01/04/15 Potential to Achieve Goals: Good    Frequency Min 2X/week   Barriers to discharge Decreased caregiver support Pt has nobody to assist her at home and she currently lacks the mobility to perform ADLs secondary to pain    Co-evaluation               End of Session Equipment Utilized During Treatment: Gait belt Activity Tolerance: Patient limited by pain Patient left: in chair;with call bell/phone within reach Nurse Communication: Mobility status (Need for chair alarm)         Time: 6045-4098 PT Time Calculation (min) (ACUTE ONLY): 26 min   Charges:         PT G CodesBenna Dunks January 12, 2015, 12:33 PM  Benna Dunks, SPT. [phone]

## 2014-12-21 NOTE — Progress Notes (Signed)
[facility] [place] at [facility]   PATIENT NAME: Kelly Foster    MR#:  [id number]  DATE OF BIRTH:  11/04/1950  SUBJECTIVE:  CHIEF COMPLAINT:   Chief Complaint  Patient presents with  . Hip Pain     [facility] for [facility] replacement, but had severe pain , can not walk. REVIEW OF SYSTEMS:  CONSTITUTIONAL: No fever, fatigue or weakness.  EYES: No blurred or double vision.  EARS, NOSE, AND THROAT: No tinnitus or ear pain.  RESPIRATORY: No cough, shortness of breath, wheezing or hemoptysis.  CARDIOVASCULAR: No chest pain, orthopnea, edema.  GASTROINTESTINAL: No nausea, vomiting, diarrhea or abdominal pain.  GENITOURINARY: No dysuria, hematuria.  ENDOCRINE: No polyuria, nocturia,  HEMATOLOGY: No anemia, easy bruising or bleeding SKIN: No rash or lesion. MUSCULOSKELETAL: severe hip joint pain or arthritis.   NEUROLOGIC: No tingling, numbness, weakness.  PSYCHIATRY: No anxiety or depression.   ROS  DRUG ALLERGIES:   Allergies  Allergen Reactions  . Codeine Itching  . Tramadol Itching    VITALS:  Blood pressure 144/62, pulse 104, temperature 98.4 F (36.9 C), temperature source Oral, resp. rate 20, height 5\' 7"  (1.702 m), weight 90.719 kg (200 lb), SpO2 97 %.  PHYSICAL EXAMINATION:  GENERAL:  64 y.o.-year-old patient lying in the bed with no acute distress.  EYES: Pupils equal, round, reactive to light and accommodation. No scleral icterus. Extraocular muscles intact.  HEENT: Head atraumatic, normocephalic. Oropharynx and nasopharynx clear.  NECK:  Supple, no jugular venous distention. No thyroid enlargement, no tenderness.  LUNGS: Normal breath sounds bilaterally, no wheezing, rales,rhonchi or crepitation. No use of accessory muscles of respiration.  CARDIOVASCULAR: S1, S2 normal. No murmurs, rubs, or gallops.  ABDOMEN: Soft, nontender, nondistended. Bowel sounds present. No organomegaly or mass.  EXTREMITIES: positive b/l  pedal edema, cyanosis, or clubbing. Hip joint movement restricted due to pain. NEUROLOGIC: face on lover part looks slightly weak on left side.. Muscle strength 5/5 in all extremities. Sensation intact. Gait not checked.  PSYCHIATRIC: The patient is alert and oriented x 3.  SKIN: No obvious rash, lesion, or ulcer.   Physical Exam LABORATORY PANEL:   CBC  Recent Labs Lab 12/20/14 1601  WBC 6.6  HGB 11.8*  HCT 34.8*  PLT 419   ------------------------------------------------------------------------------------------------------------------  Chemistries   Recent Labs Lab 12/21/14 0515  NA 132*  K 3.6  CL 100*  CO2 24  GLUCOSE 108*  BUN 5*  CREATININE 0.55  CALCIUM 8.5*   ------------------------------------------------------------------------------------------------------------------  Cardiac Enzymes No results for input(s): TROPONINI in the last 168 hours. ------------------------------------------------------------------------------------------------------------------  RADIOLOGY:  [place] Venous Img Lower Bilateral  12/21/2014  CLINICAL DATA:  Bilateral lower extremity edema for 1 month. EXAM: BILATERAL LOWER EXTREMITY VENOUS DOPPLER ULTRASOUND TECHNIQUE: Gray-scale sonography with graded compression, as well as color Doppler and duplex ultrasound were performed to evaluate the lower extremity deep venous systems from the level of the common femoral vein and including the common femoral, femoral, profunda femoral, popliteal and calf veins including the posterior tibial, peroneal and gastrocnemius veins when visible. The superficial great saphenous vein was also interrogated. Spectral Doppler was utilized to evaluate flow at rest and with distal augmentation maneuvers in the common femoral, femoral and popliteal veins. COMPARISON:  None. FINDINGS: RIGHT LOWER EXTREMITY Common Femoral Vein: No evidence of thrombus. Normal compressibility, respiratory phasicity and response to  augmentation. Saphenofemoral Junction: No evidence of thrombus. Normal compressibility and flow on color Doppler imaging. Profunda Femoral Vein:  No evidence of thrombus. Normal compressibility and flow on color Doppler imaging. Femoral Vein: No evidence of thrombus. Normal compressibility, respiratory phasicity and response to augmentation. Popliteal Vein: No evidence of thrombus. Normal compressibility, respiratory phasicity and response to augmentation. Calf Veins: No evidence of thrombus. Normal compressibility and flow on color Doppler imaging. Superficial Great Saphenous Vein: No evidence of thrombus. Normal compressibility and flow on color Doppler imaging. Venous Reflux:  None. Other Findings: No evidence of superficial thrombophlebitis or abnormal fluid collection. LEFT LOWER EXTREMITY Common Femoral Vein: No evidence of thrombus. Normal compressibility, respiratory phasicity and response to augmentation. Saphenofemoral Junction: No evidence of thrombus. Normal compressibility and flow on color Doppler imaging. Profunda Femoral Vein: No evidence of thrombus. Normal compressibility and flow on color Doppler imaging. Femoral Vein: No evidence of thrombus. Normal compressibility, respiratory phasicity and response to augmentation. Popliteal Vein: No evidence of thrombus. Normal compressibility, respiratory phasicity and response to augmentation. Calf Veins: No evidence of thrombus. Normal compressibility and flow on color Doppler imaging. Superficial Great Saphenous Vein: No evidence of thrombus. Normal compressibility and flow on color Doppler imaging. Venous Reflux:  None. Other Findings: No evidence of superficial thrombophlebitis or abnormal fluid collection. IMPRESSION: No evidence of bilateral lower extremity deep venous thrombosis. Electronically Signed   By: Irish Lack M.D.   On: 12/21/2014 17:21   Dg Hip Unilat With Pelvis 2-3 Views Left  12/20/2014  CLINICAL DATA:  64 year old female with  left hip pain, no recent injury. Scheduled for hip replacement soon. Subsequent encounter. EXAM: DG HIP (WITH OR WITHOUT PELVIS) 2-3V LEFT COMPARISON:  None. FINDINGS: Very severe left hip joint space loss with chronic severe remodeling of the left femoral head. Subchondral sclerosis and osteophytosis. Proximal left femur is intact. Moderate to severe hip joint space loss on the right with osteophytosis and subchondral sclerosis. No acute fracture of the pelvis. Calcified iliac artery atherosclerosis. Pelvic phleboliths. 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 No acute osseous abnormality identified. Electronically Signed   By: Odessa Fleming M.D.   On: 12/20/2014 16:37    ASSESSMENT AND PLAN:   Principal Problem:   Intractable pain  64 year old African-American female history of osteoarthritis who is presenting with worsening hip pain.  1. Intractable pain, left hip/degenerative joint disease: Provide pain medication, bowel regimen, physical therapy, social work.   As pt can not even stand up,it will be better to call ortho consult here, and if she accept- then just do joint replacement here. 2. Hyponatremia: Appears euvolemic,   IV fluid hydration normal saline and follow sodium levels 3. GERD without esophagitis: PPI therapy 4. Hyperlipidemia unspecified: Statin therapy 5. Venous thrombus embolism prophylactic: Heparin subcutaneous 6. B/l leg swelling- Doppler studies. 7.lower face wekaness, no other neurologial findings.     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     All the records are reviewed and case discussed with Care Management/Social Workerr. Management plans discussed with the patient, family and they are in agreement.  CODE STATUS: full  TOTAL TIME TAKING CARE OF THIS PATIENT: 35 minutes.     POSSIBLE D/C IN 1-2 DAYS, DEPENDING ON CLINICAL CONDITION.   Altamese Dilling M.D on 12/21/2014   Between  7am to 6pm - Pager - [phone]  After 6pm go to [email or web address] - password [email or web address] [facility]  [facility]  Office  [phone]  CC: Primary care physician; Hyman Hopes, MD  Note: This dictation was prepared with Dragon dictation along with smaller  Company secretary. Any transcriptional errors that result from this process are unintentional.

## 2014-12-21 NOTE — Clinical Social Work Placement (Signed)
   CLINICAL SOCIAL WORK PLACEMENT  NOTE  Date:  12/21/2014  Patient Details  Name: Adalberto Coleeggy Jean Nuttle MRN: [id number] Date of Birth: 01/04/1951  Clinical Social Work is seeking post-discharge placement for this patient at the Skilled  Nursing Facility level of care (*CSW will initial, date and re-position this form in  chart as items are completed):  Yes   Patient/family provided with [place] Clinical Social Work Department's list of facilities offering this level of care within the geographic area requested by the patient (or if unable, by the patient's family).  Yes   Patient/family informed of their freedom to choose among providers that offer the needed level of care, that participate in Medicare, Medicaid or managed care program needed by the patient, have an available bed and are willing to accept the patient.  Yes   Patient/family informed of [facility]'s ownership interest in [facility] and [facility], as well as of the fact that they are under no obligation to receive care at these facilities.  PASRR submitted to EDS on 12/21/14     PASRR number received on       Existing PASRR number confirmed on       FL2 transmitted to all facilities in geographic area requested by pt/family on       FL2 transmitted to all facilities within larger geographic area on       Patient informed that his/her managed care company has contracts with or will negotiate with certain facilities, including the following:            Patient/family informed of bed offers received.  Patient chooses bed at       Physician recommends and patient chooses bed at      Patient to be transferred to   on  .  Patient to be transferred to facility by       Patient family notified on   of transfer.  Name of family member notified:        PHYSICIAN Please sign FL2     Additional Comment:    _______________________________________________ Haig ProphetMorgan, Sunshyne Horvath G, LCSW 12/21/2014, 5:20 PM

## 2014-12-21 NOTE — Care Management Note (Signed)
Case Management Note  Patient Details  Name: Keyara Ent MRN: [id number] Date of Birth: Aug 12, 1950  Subjective/Objective:                  Met with patient to discuss discharge planning. Sodium levels have improved and patient remains in Medicare OBSERVATION status which will not cover SNF but it will cover HHPT if needed. She is currently sitting in chair/recliner. She states she has been independent at home including the ability to drive until this pain overcome her. She states she has depended on a walker for ambulation. She states she lives alone and her son lives in [place], [place]. Her PCP is with [facility] (Dr. Quay Burow) and they assist her with medications also.   Action/Plan: List of home health agencies left with patient along with my contact card. RNCM will continue to follow.   Expected Discharge Date:                  Expected Discharge Plan:     In-House Referral:     Discharge planning Services  CM Consult  Post Acute Care Choice:  Home Health Choice offered to:  Patient  DME Arranged:    DME Agency:     HH Arranged:    [place] Agency:     Status of Service:  In process, will continue to follow  Medicare Important Message Given:    Date Medicare IM Given:    Medicare IM give by:    Date Additional Medicare IM Given:    Additional Medicare Important Message give by:     If discussed at [place] of Stay Meetings, dates discussed:    Additional Comments:  Marshell Garfinkel, RN 12/21/2014, 10:49 AM

## 2014-12-21 NOTE — Clinical Social Work Note (Signed)
Clinical Social Work Assessment  Patient Details  Name: Tilley Faeth MRN: [id number] Date of Birth: [birth year]  Date of referral:  12/21/14               Reason for consult:  Facility Placement                Permission sought to share information with:  Chartered certified accountant granted to share information::  Yes, Verbal Permission Granted  Name::      [place]::   [place]   Relationship::     Contact Information:     Housing/Transportation Living arrangements for the past 2 months:  [place] of Information:  Patient Patient Interpreter Needed:  None Criminal Activity/Legal Involvement Pertinent to Current Situation/Hospitalization:  No - Comment as needed Significant Relationships:  Adult Children, Siblings Lives with:  Self Do you feel safe going back to the place where you live?  No Need for family participation in patient care:  Yes (Comment)  Care giving concerns:  Patient lives alone in [place].    Facilities manager / plan:  Holiday representative ([place]) received SNF consult. PT is recommending SNF. Patient was limited in working with PT due to pain. CSW met with patient alone at bedside to discuss D/C plan. Patient is currently Medicare Observation. Patient reported that she lives in [place] alone. Per patient her son lives in [place], [place] and spent 10 years in the [employer] and is now in college pursuing a criminal justice degree. Patient reported that she is very proud of her son. Patient reported that her siblings live in [place]. Patient reported that she has a scheduled surgery for a hip replacement at [facility] in 1 month. Per patient her hip pain has progressively gotten worse and the past 3 days she has not been able to walk. Patient reported that she has been using the bathroom on herself and not able to get meals for the past 3 days. Patient requested to have her hip replacement now at [facility]. MD has  requested an Ortho consult. CSW explained that patient's Medicare will not pay for rehab because she is observation. Patient reported that she cannot pay privately for SNF. Patient has no payer for rehab at this time. If patient changes to inpatient then SNF can be pursued.   FL2 complete. CSW will continue to follow and assist as needed.   Employment status:  Disabled (Comment on whether or not currently receiving Disability), Retired Forensic scientist:  Medicare (Medicare Observation ) PT Recommendations:  [place] / Referral to community resources:  [place]  Patient/Family's Response to care: Patient wants to have hip replacement at [facility] this admission.   Patient/Family's Understanding of and Emotional Response to Diagnosis, Current Treatment, and Prognosis: Patient was pleasant throughout assessment.   Emotional Assessment Appearance:  Appears stated age Attitude/Demeanor/Rapport:    Affect (typically observed):  Accepting, Adaptable, Pleasant Orientation:  Oriented to Self, Oriented to Place, Oriented to  Time, Oriented to Situation Alcohol / Substance use:  Not Applicable Psych involvement (Current and /or in the community):  No (Comment)  Discharge Needs  Concerns to be addressed:  Discharge Planning Concerns Readmission within the last 30 days:  No Current discharge risk:  Dependent with Mobility Barriers to Discharge:  Continued Medical Work up   Loralyn Freshwater, LCSW 12/21/2014, 5:22 PM

## 2014-12-21 NOTE — Progress Notes (Signed)
oob to chair today. Requires 2 people assist. Unable to move lle d/t pain. Pain relief with oxycodone, ble swollen . Ultrasound wnl

## 2014-12-21 NOTE — Evaluation (Signed)
Physical Therapy Evaluation Patient Details Name: Kelly Foster MRN: [id number] DOB: February 15, 1950 Today's Date: 12/21/2014   History of Present Illness  Pt is a 64 yo female who was admitted to the hospital due to intractable L hip pain. Pt is unable to take care of herself or ambulate secondary to pain. She has THA scheduled one month from now at [facility]  Clinical Impression  Pt presents with hx of HTN, bipolar, DJD, ulnar neuropathy, and obesity. Examination reveals that pt is mod A for bed mobility, mod-max A +2 for transfers, and is unable to perform ambulation secondary to pain. Strength is difficult to assess secondary to pt pain. Her primary physical deficit appears to be acute pain, as she was relatively independent prior to admission. Pt is currently not safe to return home given her mobility deficits brought on by L hip pain. Pt has no easing or relieving factors. Due to these deficits, pt will benefit from skilled PT for prevention of functional decline and eventual return to premorbid state. This entire session was guided, instructed, and directly supervised by Elizabeth Palau, DPT.     Follow Up Recommendations SNF    Equipment Recommendations   (TBD)    Recommendations for Other Services  (Orthopedics consult: may be appropriate for pain)     Precautions / Restrictions Precautions Precautions: Fall Restrictions Weight Bearing Restrictions: No      Mobility  Bed Mobility Overal bed mobility: Needs Assistance;+2 for physical assistance Bed Mobility: Supine to Sit     Supine to sit: Mod assist     General bed mobility comments: Pt needs assist for management of LEs and cues for hand placement in order to help her pull on side rails and then push on bed to maintain sittin. Needs minor assist for trunk getting upright into sitting   Transfers Overall transfer level: Needs assistance Equipment used: Rolling walker (2 wheeled)   Sit to Stand: Mod assist;+2  safety/equipment Stand pivot transfers: Max assist;+2 safety/equipment       General transfer comment: Pt requires assist to get up into standing. Pt only able to stand for about 20 seconds before needing to sit again. She requires more assist with the stand-pivot transfer as she is unable to advance feet in standing secondary to pain. Needs cues to push through walker in order to off load L hip  Ambulation/Gait                Stairs            Wheelchair Mobility    Modified Rankin (Stroke Patients Only)       Balance                                             Pertinent Vitals/Pain      Home Living Family/patient expects to be discharged to:: Skilled nursing facility Living Arrangements: Alone               Additional Comments: Pt lives alone with no available assistance at discharge.     Prior Function Level of Independence: Independent with assistive device(s)         Comments: Pt able to ambulate community distances with rollator walker or cane. Independent with ADLs     Hand Dominance        Extremity/Trunk Assessment  Communication   Communication: No difficulties  Cognition Arousal/Alertness: Awake/alert Behavior During Therapy: WFL for tasks assessed/performed Overall Cognitive Status: Within Functional Limits for tasks assessed                      General Comments      Exercises        Assessment/Plan    PT Assessment Patient needs continued PT services  PT Diagnosis Abnormality of gait;Difficulty walking;Acute pain   PT Problem List Decreased activity tolerance;Decreased mobility;Pain  PT Treatment Interventions DME instruction;Gait training;Stair training;Functional mobility training;Therapeutic activities;Therapeutic exercise;Balance training;Neuromuscular re-education   PT Goals (Current goals can be found in the Care Plan section) Acute Rehab PT  Goals Patient Stated Goal: none stated PT Goal Formulation: With patient Time For Goal Achievement: 01/04/15 Potential to Achieve Goals: Good    Frequency Min 2X/week   Barriers to discharge Decreased caregiver support      Co-evaluation               End of Session Equipment Utilized During Treatment: Gait belt Activity Tolerance: Patient limited by pain Patient left: in chair;with call bell/phone within reach Nurse Communication: Mobility status (Need for chair alarm)    Functional Assessment Tool Used: clinical judgement Functional Limitation: Mobility: Walking and moving around Mobility: Walking and Moving Around Current Status ([id number](G8978): At least 40 percent but less than 60 percent impaired, limited or restricted Mobility: Walking and Moving Around Goal Status [phone](G8979): At least 20 percent but less than 40 percent impaired, limited or restricted    Time:  -      Charges:         PT G Codes:   PT G-Codes **NOT FOR INPATIENT CLASS** Functional Assessment Tool Used: clinical judgement Functional Limitation: Mobility: Walking and moving around Mobility: Walking and Moving Around Current Status ([id number](G8978): At least 40 percent but less than 60 percent impaired, limited or restricted Mobility: Walking and Moving Around Goal Status [phone](G8979): At least 20 percent but less than 40 percent impaired, limited or restricted    [facility]ada[facility]y Berry Gallacher 12/21/2014, 3:47 PM Benna Dunksasey Yoceline Bazar, SPT. [phone]

## 2014-12-22 MED ORDER — SENNOSIDES-DOCUSATE SODIUM 8.6-50 MG PO TABS
1.0000 | ORAL_TABLET | Freq: Two times a day (BID) | ORAL | Status: DC
  Administered 2014-12-22 – 2014-12-28 (×11): 1 via ORAL
  Filled 2014-12-22 (×12): qty 1

## 2014-12-22 MED ORDER — METHYLPREDNISOLONE ACETATE 40 MG/ML INJ SUSP (RADIOLOG: 80.0000 mg | Freq: Once | INTRAMUSCULAR | Status: DC

## 2014-12-22 NOTE — Progress Notes (Signed)
[facility] - [place] at [facility]   PATIENT NAME: Kelly Foster    MR#:  [id number]  DATE OF BIRTH:  May 20, 1950  SUBJECTIVE:  CHIEF COMPLAINT:   Chief Complaint  Patient presents with  . Hip Pain     Schedule for sx at [facility] next month for joint replacement, but had severe pain , can not walk.   Pain still same, can not transfer herself from bed to chair.   Have b/l leg swelling- ruled outDVT.  REVIEW OF SYSTEMS:  CONSTITUTIONAL: No fever, fatigue or weakness.  EYES: No blurred or double vision.  EARS, NOSE, AND THROAT: No tinnitus or ear pain.  RESPIRATORY: No cough, shortness of breath, wheezing or hemoptysis.  CARDIOVASCULAR: No chest pain, orthopnea, edema.  GASTROINTESTINAL: No nausea, vomiting, diarrhea or abdominal pain.  GENITOURINARY: No dysuria, hematuria.  ENDOCRINE: No polyuria, nocturia,  HEMATOLOGY: No anemia, easy bruising or bleeding SKIN: No rash or lesion. MUSCULOSKELETAL: severe hip joint pain or arthritis.  Have b/l leg swelling. NEUROLOGIC: No tingling, numbness, weakness.  PSYCHIATRY: No anxiety or depression.   ROS  DRUG ALLERGIES:   Allergies  Allergen Reactions  . Codeine Itching  . Tramadol Itching    VITALS:  Blood pressure 117/62, pulse 94, temperature 98.8 F (37.1 C), temperature source Oral, resp. rate 18, height  (1.702 m), weight 90.719 kg (200 lb), SpO2 96 %.  PHYSICAL EXAMINATION:  GENERAL:  64 y.o.-year-old patient lying in the bed with no acute distress.  EYES: Pupils equal, round, reactive to light and accommodation. No scleral icterus. Extraocular muscles intact.  HEENT: Head atraumatic, normocephalic. Oropharynx and nasopharynx clear.  NECK:  Supple, no jugular venous distention. No thyroid enlargement, no tenderness.  LUNGS: Normal breath sounds bilaterally, no wheezing, rales,rhonchi or crepitation. No use of accessory muscles of respiration.  CARDIOVASCULAR: S1, S2 normal. No murmurs, rubs, or  gallops.  ABDOMEN: Soft, nontender, nondistended. Bowel sounds present. No organomegaly or mass.  EXTREMITIES: positive b/l pedal edema, cyanosis, or clubbing. Hip joint movement restricted due to pain. NEUROLOGIC: face on lover part looks slightly weak on left side.. Muscle strength 5/5 in all extremities. Sensation intact. Gait not checked.  PSYCHIATRIC: The patient is alert and oriented x 3.  SKIN: No obvious rash, lesion, or ulcer.   Physical Exam LABORATORY PANEL:   CBC  Recent Labs Lab 12/20/14 1601  WBC 6.6  HGB 11.8*  HCT 34.8*  PLT 419   ------------------------------------------------------------------------------------------------------------------  Chemistries   Recent Labs Lab 12/21/14 0515  NA 132*  K 3.6  CL 100*  CO2 24  GLUCOSE 108*  BUN 5*  CREATININE 0.55  CALCIUM 8.5*   ------------------------------------------------------------------------------------------------------------------  Cardiac Enzymes No results for input(s): TROPONINI in the last 168 hours. ------------------------------------------------------------------------------------------------------------------  RADIOLOGY:  US Venous Img Lower Bilateral  12/21/2014  CLINICAL DATA:  Bilateral lower extremity edema for 1 month. EXAM: BILATERAL LOWER EXTREMITY VENOUS DOPPLER ULTRASOUND TECHNIQUE: Gray-scale sonography with graded compression, as well as color Doppler and duplex ultrasound were performed to evaluate the lower extremity deep venous systems from the level of the common femoral vein and including the common femoral, femoral, profunda femoral, popliteal and calf veins including the posterior tibial, peroneal and gastrocnemius veins when visible. The superficial great saphenous vein was also interrogated. Spectral Doppler was utilized to evaluate flow at rest and with distal augmentation maneuvers in the common femoral, femoral and popliteal veins. COMPARISON:  None. FINDINGS: RIGHT  LOWER EXTREMITY Common Femoral Vein: No evidence of thrombus.  Normal compressibility, respiratory phasicity and response to augmentation. Saphenofemoral Junction: No evidence of thrombus. Normal compressibility and flow on color Doppler imaging. Profunda Femoral Vein: No evidence of thrombus. Normal compressibility and flow on color Doppler imaging. Femoral Vein: No evidence of thrombus. Normal compressibility, respiratory phasicity and response to augmentation. Popliteal Vein: No evidence of thrombus. Normal compressibility, respiratory phasicity and response to augmentation. Calf Veins: No evidence of thrombus. Normal compressibility and flow on color Doppler imaging. Superficial Great Saphenous Vein: No evidence of thrombus. Normal compressibility and flow on color Doppler imaging. Venous Reflux:  None. Other Findings: No evidence of superficial thrombophlebitis or abnormal fluid collection. LEFT LOWER EXTREMITY Common Femoral Vein: No evidence of thrombus. Normal compressibility, respiratory phasicity and response to augmentation. Saphenofemoral Junction: No evidence of thrombus. Normal compressibility and flow on color Doppler imaging. Profunda Femoral Vein: No evidence of thrombus. Normal compressibility and flow on color Doppler imaging. Femoral Vein: No evidence of thrombus. Normal compressibility, respiratory phasicity and response to augmentation. Popliteal Vein: No evidence of thrombus. Normal compressibility, respiratory phasicity and response to augmentation. Calf Veins: No evidence of thrombus. Normal compressibility and flow on color Doppler imaging. Superficial Great Saphenous Vein: No evidence of thrombus. Normal compressibility and flow on color Doppler imaging. Venous Reflux:  None. Other Findings: No evidence of superficial thrombophlebitis or abnormal fluid collection. IMPRESSION: No evidence of bilateral lower extremity deep venous thrombosis. Electronically Signed   By: Irish Lack M.D.    On: 12/21/2014 17:21   Dg Hip Unilat With Pelvis 2-3 Views Left  12/20/2014  CLINICAL DATA:  64 year old female with left hip pain, no recent injury. Scheduled for hip replacement soon. Subsequent encounter. EXAM: DG HIP (WITH OR WITHOUT PELVIS) 2-3V LEFT COMPARISON:  None. FINDINGS: Very severe left hip joint space loss with chronic severe remodeling of the left femoral head. Subchondral sclerosis and osteophytosis. Proximal left femur is intact. Moderate to severe hip joint space loss on the right with osteophytosis and subchondral sclerosis. No acute fracture of the pelvis. Calcified iliac artery atherosclerosis. Pelvic phleboliths. 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 No acute osseous abnormality identified. Electronically Signed   By: Odessa Fleming M.D.   On: 12/20/2014 16:37    ASSESSMENT AND PLAN:   Principal Problem:   Intractable pain  64 year old African-American female history of osteoarthritis who is presenting with worsening hip pain.  1. Intractable pain, left hip/degenerative joint disease: Provide pain medication, bowel regimen, physical therapy, social work. Elevated orthopedic consult, patient agrees to have her hip joint replacement here if it is offered to her by orthopedic.  She could not transfer herself from bed to chair as per the nurse's, so she may need rehabilitation.   2. Hyponatremia: Appears euvolemic,   IV fluid hydration normal saline and follow sodium levels  Recheck tomorrow morning. 3. GERD without esophagitis: PPI therapy 4. Hyperlipidemia unspecified: Statin therapy 5. Venous thrombus embolism prophylactic: Heparin subcutaneous 6. B/l leg swelling- Doppler studies.- Rule out any DVT.   Likely the swelling is because of immobilization. 7.lower face wekaness, no other neurologial findings.     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     All the records are reviewed and case discussed  with Care Management/Social Workerr. Management plans discussed with the patient, family and they are in agreement.  CODE STATUS: full  TOTAL TIME TAKING CARE OF THIS PATIENT: 35 minutes.  Tolerated further decision from orthopedic team.   POSSIBLE D/C IN 1-2  DAYS, DEPENDING ON CLINICAL CONDITION.   Altamese DillingVACHHANI, Nyshawn Gowdy M.D on 12/22/2014   Between 7am to 6pm - Pager - [phone]  After 6pm go to [email or web address] - password [email or web address]  Fabio [facility]  Office  [phone]  CC: Primary care physician; Hyman HopesBurns, Harriett P, MD  Note: This dictation was prepared with Dragon dictation along with smaller phrase technology. Any transcriptional errors that result from this process are unintentional.

## 2014-12-22 NOTE — Progress Notes (Signed)
Plan is for patient to have surgery with Dr. Rudene Christians on Tuesday. Clinical Education officer, museum (CSW) met with patient and explained that she will need a 3 night qualifying inpatient stay in order for Medicare to pay for rehab. With surgery patient will likely meet the 3 night stay. Patient is agreeable to SNF search.   CSW presented bed offers. Patient chose [facility]. CSW will continue to follow and assist as needed.   Blima Rich, [place] [phone]

## 2014-12-22 NOTE — Care Management (Signed)
Ortho pending. She will be Medicare Bundle. I have notified Dr. Elizabeth SauerHoward Millers Medicare Bundle team by Email.

## 2014-12-22 NOTE — Progress Notes (Signed)
Physical Therapy Treatment Patient Details Name: Kelly Foster MRN: [id number] DOB: 03/19/1950 Today's Date: 12/22/2014    History of Present Illness Pt is a 64 yo female who was admitted to the hospital due to intractable L hip pain. Pt is unable to take care of herself or ambulate secondary to pain. She [facility]    PT Comments    Pt progressing with her mobility goals by actively taking steps this morning, however her ability to ambulate is severely limited by pain and she requires +2 in order to facilitate minimal ambulation. She is very willing to participate in therapy, but her activity tolerance and functional mobility continue to be significantly deviated from baseline and pt is not safe to return home. Due to these deficits she will continue to benefit from skilled PT in order to return her to optimal PLOF. Pt expressing to PT that she is open to orthopedic intervention.    Follow Up Recommendations  SNF     Equipment Recommendations  Rolling walker with 5" wheels    Recommendations for Other Services       Precautions / Restrictions Precautions Precautions: Fall Restrictions Weight Bearing Restrictions: No    Mobility  Bed Mobility Overal bed mobility: Needs Assistance;+2 for physical assistance Bed Mobility: Supine to Sit     Supine to sit: Mod assist     General bed mobility comments: Pt needs assist for management of LEs and cues for hand placement in order to help her pull on side rails and then push on bed to maintain sittin. Needs minor assist for trunk getting upright into sitting   Transfers Overall transfer level: Needs assistance Equipment used: Rolling walker (2 wheeled) Transfers: Sit to/from Stand Sit to Stand: Mod assist;+2 safety/equipment         General transfer comment: Pt requires mod assist of the trunk to get into standing as well as heavy cues to stand up tall and "look up". Pt not buckling in  standing. Needs assist to maintain standing  Ambulation/Gait Ambulation/Gait assistance: Mod assist Ambulation Distance (Feet): 3 Feet Assistive device: Rolling walker (2 wheeled) Gait Pattern/deviations: Step-to pattern;Decreased step length - right;Decreased step length - left;Decreased stride length;Shuffle;Antalgic Gait velocity: severely decreased Gait velocity interpretation: <1.8 ft/sec, indicative of risk for recurrent falls General Gait Details: Pt needs assist for maintaining upright as well as assist for management of RW and occasional facilitation of stepping with bilateral LEs. Needs verbal cues for sequencing and tactile cues to initiate stepping   Stairs            Wheelchair Mobility    Modified Rankin (Stroke Patients Only)       Balance Overall balance assessment:  (impaired standing balance secondary to pain)                                  Cognition Arousal/Alertness: Awake/alert Behavior During Therapy: WFL for tasks assessed/performed Overall Cognitive Status: Within Functional Limits for tasks assessed                      Exercises      General Comments        Pertinent Vitals/Pain Pain Assessment: 0-10 Pain Score: 10-Worst pain ever ( worse than yesterday) Pain Location: L hip Pain Descriptors / Indicators: Constant Pain Intervention(s): Limited activity within patient's tolerance;Monitored during session;Premedicated before session  Home Living                      Prior Function            PT Goals (current goals can now be found in the care plan section) Acute Rehab PT Goals Patient Stated Goal: none stated PT Goal Formulation: With patient Time For Goal Achievement: 01/04/15 Potential to Achieve Goals: Good Progress towards PT goals: Progressing toward goals    Frequency  Min 2X/week    PT Plan Current plan remains appropriate    Co-evaluation             End of Session  Equipment Utilized During Treatment: Gait belt Activity Tolerance: Patient limited by pain;Patient tolerated treatment well Patient left: in chair;with call bell/phone within reach     Time: 1112-1122 PT Time Calculation (min) (ACUTE ONLY): 10 min  Charges:                       G CodesBenna Dunks 2015/01/21, 11:33 AM  Benna Dunks, SPT. [phone]

## 2014-12-22 NOTE — Consult Note (Signed)
Discussed with Dr Hyacinth MeekerMiller, plan anterior THA  11/22.

## 2014-12-22 NOTE — Clinical Social Work Placement (Addendum)
   CLINICAL SOCIAL WORK PLACEMENT  NOTE  Date:  12/22/2014  Patient Details  Name: Kelly Foster MRN: [id number] Date of Birth: 08/26/1950  Clinical Social Work is seeking post-discharge placement for this patient at the Skilled  Nursing Facility level of care (*CSW will initial, date and re-position this form in  chart as items are completed):  Yes   Patient/family provided with [facility] Clinical Social Work Department's list of facilities offering this level of care within the geographic area requested by the patient (or if unable, by the patient's family).  Yes   Patient/family informed of their freedom to choose among providers that offer the needed level of care, that participate in Medicare, Medicaid or managed care program needed by the patient, have an available bed and are willing to accept the patient.  Yes   Patient/family informed of [facility]'s ownership interest in [facility] and [facility], as well as of the fact that they are under no obligation to receive care at these facilities.  PASRR submitted to EDS on 12/21/14     PASRR number received on 12/22/14     Existing PASRR number confirmed on       FL2 transmitted to all facilities in geographic area requested by pt/family on 12/22/14  [id number] E PASARR expires: 01/21/2015       FL2 transmitted to all facilities within larger geographic area on       Patient informed that his/her managed care company has contracts with or will negotiate with certain facilities, including the following:            Patient/family informed of bed offers received.  Patient chooses bed at       Physician recommends and patient chooses bed at      Patient to be transferred to   on  .  Patient to be transferred to facility by       Patient family notified on   of transfer.  Name of family member notified:        PHYSICIAN Please sign FL2     Additional Comment:     _______________________________________________ Haig ProphetMorgan, Layia Walla G, LCSW 12/22/2014, 2:29 PM

## 2014-12-22 NOTE — NC FL2 (Signed)
[place] MEDICAID FL2 LEVEL OF CARE SCREENING TOOL     IDENTIFICATION  Patient Name: Kelly Foster Birthdate: September 08, 1950 Sex: female Admission Date (Current Location): 12/20/2014  [place] and [place] Number: Chiropodist and Address:  [facility], [address] [place], [place] [address]      Provider Number: [id number]  Attending Physician Name and Address:  Altamese Dilling, MD  Relative Name and Phone Number:       Current Level of Care: Hospital Recommended Level of Care: Skilled Nursing Facility Prior Approval Number:    Date Approved/Denied:   PASRR Number:   [id number]    Discharge Plan: SNF    Current Diagnoses: Patient Active Problem List   Diagnosis Date Noted  . Intractable pain 12/20/2014  . Urinary frequency 07/29/2014  . Neuritis or radiculitis due to rupture of lumbar intervertebral disc 05/29/2014  . Spinal stenosis 11/29/2013  . Extremity numbness 11/29/2013  . Degenerative arthritis of hip 07/01/2013  . 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 Bipolar 1 disorder, mixed (HCC) 06/06/2013    Orientation ACTIVITIES/SOCIAL BLADDER RESPIRATION    Self, Time, Situation, Place  Active Incontinent Normal  BEHAVIORAL SYMPTOMS/MOOD NEUROLOGICAL BOWEL NUTRITION STATUS   (none)  (none) Continent Diet  PHYSICIAN VISITS COMMUNICATION OF NEEDS Height & Weight Skin  30 days Verbally   200 lbs. Normal          AMBULATORY STATUS RESPIRATION    Supervision limited Normal      Personal Care Assistance Level of Assistance  Bathing, Dressing Bathing Assistance: Limited assistance   Dressing Assistance: Limited assistance      Functional Limitations Info                SPECIAL CARE FACTORS FREQUENCY  PT (By licensed PT)                   Additional Factors Info  Code Status, Allergies Code Status Info: Full Allergies Info: codeine tramadol           Current Medications  (12/22/2014): Current Facility-Administered Medications  Medication Dose Route Frequency Provider Last Rate Last Dose  . 0.9 %  sodium chloride infusion   Intravenous Continuous Wyatt Haste, MD 100 mL/hr at 12/22/14 0930    . acetaminophen (TYLENOL) tablet 650 mg  650 mg Oral Q6H PRN Wyatt Haste, MD       Or  . acetaminophen (TYLENOL) suppository 650 mg  650 mg Rectal Q6H PRN Wyatt Haste, MD      . albuterol (PROVENTIL) (2.5 MG/3ML) 0.083% nebulizer solution 3 mL  3 mL Inhalation Q6H PRN Wyatt Haste, MD      . azelastine (ASTELIN) 0.1 % nasal spray 1 spray  1 spray Each Nare BID Wyatt Haste, MD   1 spray at 12/22/14 0957  . diclofenac (VOLTAREN) EC tablet 50 mg  50 mg Oral BID PRN Wyatt Haste, MD      . diphenhydrAMINE (BENADRYL) capsule 25 mg  25 mg Oral Q4H PRN Wyatt Haste, MD      . divalproex (DEPAKOTE ER) 24 hr tablet 1,000 mg  1,000 mg Oral QHS Wyatt Haste, MD   1,000 mg at 12/21/14 2125  . docusate sodium (COLACE) capsule 100 mg  100 mg Oral BID Wyatt Haste, MD   100 mg at 12/22/14 0957  . enoxaparin (LOVENOX) injection 40 mg  40 mg Subcutaneous Q24H Delfino Lovett, MD  40 mg at 12/21/14 2127  . fluticasone (FLONASE) 50 MCG/ACT nasal spray 2 spray  2 spray Each Nare Daily PRN Wyatt Hasteavid K Hower, MD      . montelukast (SINGULAIR) tablet 10 mg  10 mg Oral QHS Wyatt Hasteavid K Hower, MD   10 mg at 12/21/14 2125  . morphine 2 MG/ML injection 2 mg  2 mg Intravenous Q4H PRN Wyatt Hasteavid K Hower, MD   2 mg at 12/21/14 0433  . ondansetron (ZOFRAN) tablet 4 mg  4 mg Oral Q6H PRN Wyatt Hasteavid K Hower, MD       Or  . ondansetron [facility](ZOFRAN) injection 4 mg  4 mg Intravenous Q6H PRN Wyatt Hasteavid K Hower, MD      . oxybutynin (DITROPAN) tablet 5 mg  5 mg Oral BID Delfino LovettVipul Shah, MD   5 mg at 12/22/14 0957  . oxyCODONE (Oxy IR/ROXICODONE) immediate release tablet 5 mg  5 mg Oral Q4H PRN Wyatt Hasteavid K Hower, MD   5 mg at 12/22/14 1338  . pantoprazole (PROTONIX) EC tablet 40 mg  40 mg Oral Daily Wyatt Hasteavid K Hower, MD   40 mg at 12/22/14  0957  . polyethylene glycol (MIRALAX / GLYCOLAX) packet 17 g  17 g Oral Daily PRN Wyatt Hasteavid K Hower, MD   17 g at 12/22/14 0930  . risperiDONE (RISPERDAL) tablet 4 mg  4 mg Oral QHS Wyatt Hasteavid K Hower, MD   4 mg at 12/21/14 2123  . sertraline (ZOLOFT) tablet 200 mg  200 mg Oral QHS Wyatt Hasteavid K Hower, MD   200 mg at 12/21/14 2124  . simvastatin (ZOCOR) tablet 40 mg  40 mg Oral QHS Wyatt Hasteavid K Hower, MD   40 mg at 12/21/14 2124  . traZODone (DESYREL) tablet 200 mg  200 mg Oral QHS Wyatt Hasteavid K Hower, MD   200 mg at 12/21/14 2125   Do not use this list as official medication orders. Please verify with discharge summary.  Discharge Medications:   Medication List    ASK your doctor about these medications        acetaminophen 650 MG CR tablet  Commonly known as:  TYLENOL  Take 650-1,300 mg by mouth every 8 (eight) hours as needed for pain.     acyclovir 400 MG tablet  Commonly known as:  ZOVIRAX  Take 400 mg by mouth 3 (three) times daily as needed (for flares).     albuterol 108 (90 BASE) MCG/ACT inhaler  Commonly known as:  PROVENTIL HFA;VENTOLIN HFA  Inhale 2 puffs into the lungs every 6 (six) hours as needed for wheezing or shortness of breath.     azelastine 0.1 % nasal spray  Commonly known as:  ASTELIN  Place 1 spray into both nostrils 2 (two) times daily.     diclofenac 50 MG EC tablet  Commonly known as:  VOLTAREN  Take 50 mg by mouth 2 (two) times daily as needed for mild pain.     divalproex 500 MG 24 hr tablet  Commonly known as:  DEPAKOTE ER  Take 1,000 mg by mouth at bedtime.     fluticasone 50 MCG/ACT nasal spray  Commonly known as:  FLONASE  Place 2 sprays into both nostrils daily as needed for rhinitis.     montelukast 10 MG tablet  Commonly known as:  SINGULAIR  Take 10 mg by mouth at bedtime.     omeprazole 40 MG capsule  Commonly known as:  PRILOSEC  Take 40 mg by mouth daily.  oxybutynin 5 MG 24 hr tablet  Commonly known as:  DITROPAN-XL  Take 5 mg by mouth at  bedtime.     Oxycodone HCl 10 MG Tabs  Take 5-10 mg by mouth 2 (two) times daily as needed (for pain).     risperidone 4 MG tablet  Commonly known as:  RISPERDAL  Take 4 mg by mouth at bedtime.     sertraline 100 MG tablet  Commonly known as:  ZOLOFT  Take 200 mg by mouth at bedtime.     simvastatin 40 MG tablet  Commonly known as:  ZOCOR  Take 40 mg by mouth at bedtime.     traZODone 100 MG tablet  Commonly known as:  DESYREL  Take 200 mg by mouth at bedtime.        Relevant Imaging Results:  Relevant Lab Results:  Recent Labs    Additional Information SS: [id number]  Haig Prophet, LCSW

## 2014-12-22 NOTE — Clinical Social Work Placement (Signed)
   CLINICAL SOCIAL WORK PLACEMENT  NOTE  Date:  12/22/2014  Patient Details  Name: Kelly Foster MRN: [id number] Date of Birth: 05/05/1950  Clinical Social Work is seeking post-discharge placement for this patient at the Skilled  Nursing Facility level of care (*CSW will initial, date and re-position this form in  chart as items are completed):  Yes   Patient/family provided with [facility] Clinical Social Work Department's list of facilities offering this level of care within the geographic area requested by the patient (or if unable, by the patient's family).  Yes   Patient/family informed of their freedom to choose among providers that offer the needed level of care, that participate in Medicare, Medicaid or managed care program needed by the patient, have an available bed and are willing to accept the patient.  Yes   Patient/family informed of [facility]'s ownership interest in [facility] and [facility], as well as of the fact that they are under no obligation to receive care at these facilities.  PASRR submitted to EDS on 12/21/14     PASRR number received on 12/22/14     Existing PASRR number confirmed on       FL2 transmitted to all facilities in geographic area requested by pt/family on 12/22/14     FL2 transmitted to all facilities within larger geographic area on       Patient informed that his/her managed care company has contracts with or will negotiate with certain facilities, including the following:        Yes   Patient/family informed of bed offers received.  Patient chooses bed at  [facility]     Physician recommends and patient chooses bed at      Patient to be transferred to   on  .  Patient to be transferred to facility by       Patient family notified on   of transfer.  Name of family member notified:        PHYSICIAN       Additional Comment:    _______________________________________________ Haig ProphetMorgan, Kemyah Buser G,  LCSW 12/22/2014, 3:17 PM

## 2014-12-22 NOTE — Care Management Obs Status (Signed)
MEDICARE OBSERVATION STATUS NOTIFICATION   Patient Details  Name: Kelly Foster MRN: [id number] Date of Birth: 06/26/1950   Medicare Observation Status Notification Given:  Yes    Collie SiadAngela Vegas Coffin, RN 12/22/2014, 8:09 AM

## 2014-12-23 DIAGNOSIS — F319 Bipolar disorder, unspecified: Secondary | ICD-10-CM | POA: Diagnosis present

## 2014-12-23 DIAGNOSIS — I708 Atherosclerosis of other arteries: Secondary | ICD-10-CM | POA: Diagnosis present

## 2014-12-23 DIAGNOSIS — K219 Gastro-esophageal reflux disease without esophagitis: Secondary | ICD-10-CM | POA: Diagnosis present

## 2014-12-23 DIAGNOSIS — M1612 Unilateral primary osteoarthritis, left hip: Secondary | ICD-10-CM | POA: Diagnosis present

## 2014-12-23 DIAGNOSIS — Z7982 Long term (current) use of aspirin: Secondary | ICD-10-CM | POA: Diagnosis not present

## 2014-12-23 DIAGNOSIS — G8929 Other chronic pain: Secondary | ICD-10-CM | POA: Diagnosis present

## 2014-12-23 DIAGNOSIS — E871 Hypo-osmolality and hyponatremia: Secondary | ICD-10-CM | POA: Diagnosis present

## 2014-12-23 DIAGNOSIS — Z87891 Personal history of nicotine dependence: Secondary | ICD-10-CM | POA: Diagnosis not present

## 2014-12-23 DIAGNOSIS — E785 Hyperlipidemia, unspecified: Secondary | ICD-10-CM | POA: Diagnosis present

## 2014-12-23 DIAGNOSIS — Z79899 Other long term (current) drug therapy: Secondary | ICD-10-CM | POA: Diagnosis not present

## 2014-12-23 DIAGNOSIS — G510 Bell's palsy: Secondary | ICD-10-CM | POA: Diagnosis present

## 2014-12-23 DIAGNOSIS — M25552 Pain in left hip: Secondary | ICD-10-CM | POA: Diagnosis present

## 2014-12-23 DIAGNOSIS — M8788 Other osteonecrosis, other site: Secondary | ICD-10-CM | POA: Diagnosis present

## 2014-12-23 DIAGNOSIS — F419 Anxiety disorder, unspecified: Secondary | ICD-10-CM | POA: Diagnosis present

## 2014-12-23 LAB — BASIC METABOLIC PANEL
Anion gap: 6 (ref 5–15)
CO2: 30 mmol/L (ref 22–32)
CREATININE: 0.48 mg/dL (ref 0.44–1.00)
Calcium: 8.6 mg/dL — ABNORMAL LOW (ref 8.9–10.3)
Chloride: 99 mmol/L — ABNORMAL LOW (ref 101–111)
GFR calc Af Amer: >60 mL/min (ref >60)
Glucose, Bld: 105 mg/dL — ABNORMAL HIGH (ref 65–99)
Potassium: 3.9 mmol/L (ref 3.5–5.1)
SODIUM: 135 mmol/L (ref 135–145)

## 2014-12-23 MED ORDER — OXYCODONE HCL 5 MG PO TABS
5.0000 mg | ORAL_TABLET | ORAL | Status: DC | PRN
  Administered 2014-12-23: 5 mg via ORAL
  Administered 2014-12-23 – 2014-12-25 (×9): 10 mg via ORAL
  Administered 2014-12-26 – 2014-12-28 (×9): 5 mg via ORAL
  Filled 2014-12-23 (×2): qty 2
  Filled 2014-12-23: qty 10
  Filled 2014-12-23: qty 1
  Filled 2014-12-23: qty 2
  Filled 2014-12-23 (×4): qty 1
  Filled 2014-12-23 (×2): qty 2
  Filled 2014-12-23 (×3): qty 1
  Filled 2014-12-23: qty 2
  Filled 2014-12-23: qty 1
  Filled 2014-12-23 (×3): qty 2
  Filled 2014-12-23: qty 1

## 2014-12-23 NOTE — Progress Notes (Addendum)
Discussed procedure, left THA, will review bone model and brochures this weekend prior to surgery.   Discussed the surgery again with patient, using bone model and brochure to give adequate information preoperatively. She appears to understand the procedure risks benefits possible complications

## 2014-12-23 NOTE — Progress Notes (Addendum)
Patient request for pain medication to be increase, Dr.Vachhani paged, waiting for callback. New order received from MD.

## 2014-12-23 NOTE — Progress Notes (Signed)
[facility] [place] at [facility]   PATIENT NAME: Kelly Foster    MR#:  [id number]  DATE OF BIRTH:  08/03/1950  SUBJECTIVE:  CHIEF COMPLAINT:   Chief Complaint  Patient presents with  . Hip Pain     [facility], but had severe pain , can not walk.   Pain still same, can not transfer herself from bed to chair.   Seen by ortho- and sx is scheduled for Tuesday.   No complains- waiting for sx.  REVIEW OF SYSTEMS:  CONSTITUTIONAL: No fever, fatigue or weakness.  EYES: No blurred or double vision.  EARS, NOSE, AND THROAT: No tinnitus or ear pain.  RESPIRATORY: No cough, shortness of breath, wheezing or hemoptysis.  CARDIOVASCULAR: No chest pain, orthopnea, edema.  GASTROINTESTINAL: No nausea, vomiting, diarrhea or abdominal pain.  GENITOURINARY: No dysuria, hematuria.  ENDOCRINE: No polyuria, nocturia,  HEMATOLOGY: No anemia, easy bruising or bleeding SKIN: No rash or lesion. MUSCULOSKELETAL: severe hip joint pain or arthritis.  Have b/l leg swelling- little better. NEUROLOGIC: No tingling, numbness, weakness.  PSYCHIATRY: No anxiety or depression.   ROS  DRUG ALLERGIES:   Allergies  Allergen Reactions  . Codeine Itching  . Tramadol Itching    VITALS:  Blood pressure 136/72, pulse 95, temperature 99.8 F (37.7 C), temperature source Oral, resp. rate 16, height 5\' 7"  (1.702 m), weight 90.719 kg (200 lb), SpO2 94 %.  PHYSICAL EXAMINATION:  GENERAL:  64 y.o.-year-old patient lying in the bed with no acute distress.  EYES: Pupils equal, round, reactive to light and accommodation. No scleral icterus. Extraocular muscles intact.  HEENT: Head atraumatic, normocephalic. Oropharynx and nasopharynx clear.  NECK:  Supple, no jugular venous distention. No thyroid enlargement, no tenderness.  LUNGS: Normal breath sounds bilaterally, no wheezing, rales,rhonchi or crepitation. No use of accessory muscles of  respiration.  CARDIOVASCULAR: S1, S2 normal. No murmurs, rubs, or gallops.  ABDOMEN: Soft, nontender, nondistended. Bowel sounds present. No organomegaly or mass.  EXTREMITIES: positive b/l pedal edema, cyanosis, or clubbing. Hip joint movement restricted due to pain. NEUROLOGIC: face on lover part looks slightly weak on left side.. Muscle strength 5/5 in all extremities. Sensation intact. Gait not checked. Not moving much due to pain. PSYCHIATRIC: The patient is alert and oriented x 3.  SKIN: No obvious rash, lesion, or ulcer.   Physical Exam LABORATORY PANEL:   CBC  Recent Labs Lab 12/20/14 1601  WBC 6.6  HGB 11.8*  HCT 34.8*  PLT 419   ------------------------------------------------------------------------------------------------------------------  Chemistries   Recent Labs Lab 12/23/14 0415  NA 135  K 3.9  CL 99*  CO2 30  GLUCOSE 105*  BUN <5*  CREATININE 0.48  CALCIUM 8.6*   ------------------------------------------------------------------------------------------------------------------  Cardiac Enzymes No results for input(s): TROPONINI in the last 168 hours. ------------------------------------------------------------------------------------------------------------------  RADIOLOGY:  [place] Venous Img Lower Bilateral  12/21/2014  CLINICAL DATA:  Bilateral lower extremity edema for 1 month. EXAM: BILATERAL LOWER EXTREMITY VENOUS DOPPLER ULTRASOUND TECHNIQUE: Gray-scale sonography with graded compression, as well as color Doppler and duplex ultrasound were performed to evaluate the lower extremity deep venous systems from the level of the common femoral vein and including the common femoral, femoral, profunda femoral, popliteal and calf veins including the posterior tibial, peroneal and gastrocnemius veins when visible. The superficial great saphenous vein was also interrogated. Spectral Doppler was utilized to evaluate flow at rest and with distal augmentation  maneuvers in the common femoral, femoral  and popliteal veins. COMPARISON:  None. FINDINGS: RIGHT LOWER EXTREMITY Common Femoral Vein: No evidence of thrombus. Normal compressibility, respiratory phasicity and response to augmentation. Saphenofemoral Junction: No evidence of thrombus. Normal compressibility and flow on color Doppler imaging. Profunda Femoral Vein: No evidence of thrombus. Normal compressibility and flow on color Doppler imaging. Femoral Vein: No evidence of thrombus. Normal compressibility, respiratory phasicity and response to augmentation. Popliteal Vein: No evidence of thrombus. Normal compressibility, respiratory phasicity and response to augmentation. Calf Veins: No evidence of thrombus. Normal compressibility and flow on color Doppler imaging. Superficial Great Saphenous Vein: No evidence of thrombus. Normal compressibility and flow on color Doppler imaging. Venous Reflux:  None. Other Findings: No evidence of superficial thrombophlebitis or abnormal fluid collection. LEFT LOWER EXTREMITY Common Femoral Vein: No evidence of thrombus. Normal compressibility, respiratory phasicity and response to augmentation. Saphenofemoral Junction: No evidence of thrombus. Normal compressibility and flow on color Doppler imaging. Profunda Femoral Vein: No evidence of thrombus. Normal compressibility and flow on color Doppler imaging. Femoral Vein: No evidence of thrombus. Normal compressibility, respiratory phasicity and response to augmentation. Popliteal Vein: No evidence of thrombus. Normal compressibility, respiratory phasicity and response to augmentation. Calf Veins: No evidence of thrombus. Normal compressibility and flow on color Doppler imaging. Superficial Great Saphenous Vein: No evidence of thrombus. Normal compressibility and flow on color Doppler imaging. Venous Reflux:  None. Other Findings: No evidence of superficial thrombophlebitis or abnormal fluid collection. IMPRESSION: No evidence of  bilateral lower extremity deep venous thrombosis. Electronically Signed   By: Irish Lack M.D.   On: 12/21/2014 17:21    ASSESSMENT AND PLAN:   Principal Problem:   Intractable pain  64 year old African-American female history of osteoarthritis who is presenting with worsening hip pain.  1. Intractable pain, left hip/degenerative joint disease: Provide pain medication, bowel regimen,   appreciated orthopedic consult, patient agrees to have her hip joint replacement - planned for next Tuesday.  She could not transfer herself from bed to chair as per the nurse's, so she may need rehabilitation.   2. Hyponatremia: Appears euvolemic,   IV fluid hydration normal saline and follow sodium levels  normal on follow ups, pt was not on IV fluids and eating good. 3. GERD without esophagitis: PPI therapy 4. Hyperlipidemia unspecified: Statin therapy 5. Venous thrombus embolism prophylactic: Heparin subcutaneous 6. B/l leg swelling- Doppler studies.- Ruled out any DVT.   Likely the swelling is because of immobilization. 7.lower face wekaness, no other neurologial findings.     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   All the records are reviewed and case discussed with Care Management/Social Workerr. Management plans discussed with the patient, family and they are in agreement.  CODE STATUS: full  TOTAL TIME TAKING CARE OF THIS PATIENT: 20 mins.  Tolerated further decision from orthopedic team.   POSSIBLE D/C IN 3-4 d, DEPENDING ON CLINICAL CONDITION.   Altamese Dilling M.D on 12/23/2014   Between 7am to 6pm - Pager - [phone]  After 6pm go to [email or web address] - password [email or web address]  [facility]  Office  [phone]  CC: Primary care physician; Hyman Hopes, MD  Note: This dictation was prepared with Dragon dictation along with smaller phrase technology. Any transcriptional errors that result from this process are unintentional.

## 2014-12-23 NOTE — Progress Notes (Signed)
Per Dr. Thurston PoundsMarian Sims at [facility] she states the patient meets inpatient criteria. Order has been changed.

## 2014-12-24 LAB — SURGICAL PCR SCREEN
MRSA, PCR: NEGATIVE
Staphylococcus aureus: NEGATIVE

## 2014-12-24 NOTE — Progress Notes (Signed)
[facility] Physicians - [place] at [facility]   PATIENT NAME: Kelly Foster    MR#:  [id number]  DATE OF BIRTH:  21-Jan-1951  SUBJECTIVE:  CHIEF COMPLAINT:   Chief Complaint  Patient presents with  . Hip Pain     Schedule for sx at [facility] next month for joint replacement, but had severe pain , can not walk.   Pain still same, can not transfer herself from bed to chair.   Seen by ortho- and sx is scheduled for Tuesday.   No complains- waiting for sx.  Pain meds increased yesterday.  no new changes- waiting for surgery.  REVIEW OF SYSTEMS:  CONSTITUTIONAL: No fever, fatigue or weakness.  EYES: No blurred or double vision.  EARS, NOSE, AND THROAT: No tinnitus or ear pain.  RESPIRATORY: No cough, shortness of breath, wheezing or hemoptysis.  CARDIOVASCULAR: No chest pain, orthopnea, edema.  GASTROINTESTINAL: No nausea, vomiting, diarrhea or abdominal pain.  GENITOURINARY: No dysuria, hematuria.  ENDOCRINE: No polyuria, nocturia,  HEMATOLOGY: No anemia, easy bruising or bleeding SKIN: No rash or lesion. MUSCULOSKELETAL: severe hip joint pain or arthritis.  Have b/l leg swelling- little better. NEUROLOGIC: No tingling, numbness, weakness.  PSYCHIATRY: No anxiety or depression.   ROS  DRUG ALLERGIES:   Allergies  Allergen Reactions  . Codeine Itching  . Tramadol Itching    VITALS:  Blood pressure 148/63, pulse 78, temperature 99.2 F (37.3 C), temperature source Oral, resp. rate 16, height  (1.702 m), weight 90.719 kg (200 lb), SpO2 98 %.  PHYSICAL EXAMINATION:  GENERAL:  64 y.o.-year-old patient lying in the bed with no acute distress.  EYES: Pupils equal, round, reactive to light and accommodation. No scleral icterus. Extraocular muscles intact.  HEENT: Head atraumatic, normocephalic. Oropharynx and nasopharynx clear.  NECK:  Supple, no jugular venous distention. No thyroid enlargement, no tenderness.  LUNGS: Normal breath sounds bilaterally, no  wheezing, rales,rhonchi or crepitation. No use of accessory muscles of respiration.  CARDIOVASCULAR: S1, S2 normal. No murmurs, rubs, or gallops.  ABDOMEN: Soft, nontender, nondistended. Bowel sounds present. No organomegaly or mass.  EXTREMITIES: positive b/l pedal edema, cyanosis, or clubbing. Hip joint movement restricted due to pain. NEUROLOGIC: face on lover part looks slightly weak on left side.. Muscle strength 5/5 in all extremities. Sensation intact. Gait not checked. Not moving lower extrimities much due to pain. PSYCHIATRIC: The patient is alert and oriented x 3.  SKIN: No obvious rash, lesion, or ulcer.   Physical Exam LABORATORY PANEL:   CBC  Recent Labs Lab 12/20/14 1601  WBC 6.6  HGB 11.8*  HCT 34.8*  PLT 419   ------------------------------------------------------------------------------------------------------------------  Chemistries   Recent Labs Lab 12/23/14 0415  NA 135  K 3.9  CL 99*  CO2 30  GLUCOSE 105*  BUN <5*  CREATININE 0.48  CALCIUM 8.6*   ------------------------------------------------------------------------------------------------------------------  Cardiac Enzymes No results for input(s): TROPONINI in the last 168 hours. ------------------------------------------------------------------------------------------------------------------  RADIOLOGY:  No results found.  ASSESSMENT AND PLAN:   Principal Problem:   Intractable pain  64 year old African-American female history of osteoarthritis who is presenting with worsening hip pain.  1. Intractable pain, left hip/degenerative joint disease: Provide pain medication, bowel regimen,   appreciated orthopedic consult, patient agrees to have her hip joint replacement - planned for next Tuesday.  She could not transfer herself from bed to chair as per the nurse's, so she may need rehabilitation.   2. Hyponatremia: Appears euvolemic,   IV fluid hydration normal saline and  follow sodium  levels  normal on follow ups, pt was not on IV fluids and eating good. 3. GERD without esophagitis: PPI therapy 4. Hyperlipidemia unspecified: Statin therapy 5. Venous thrombus embolism prophylactic: Heparin subcutaneous 6. B/l leg swelling- Doppler studies.- Ruled out any DVT.   Likely the swelling is because of immobilization. 7.lower face wekaness, no other neurologial findings.     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  Awaiting surgery- no changes in plan today, continue to monitor.  All the records are reviewed and case discussed with Care Management/Social Workerr. Management plans discussed with the patient, family and they are in agreement.  CODE STATUS: full  TOTAL TIME TAKING CARE OF THIS PATIENT: 15 mins.    POSSIBLE D/C IN 3-4 d, DEPENDING ON CLINICAL CONDITION.   Altamese DillingVACHHANI, Albertina Leise M.D on 12/24/2014   Between 7am to 6pm - Pager - [phone]  After 6pm go to [email or web address] - password [email or web address]  Fabio Neighborsagle [place] [facility]  Office  [phone]  CC: Primary care physician; Hyman HopesBurns, Harriett P, MD  Note: This dictation was prepared with Dragon dictation along with smaller phrase technology. Any transcriptional errors that result from this process are unintentional.

## 2014-12-24 NOTE — Care Management Important Message (Signed)
Important Message  Patient Details  Name: Kelly Foster MRN: [id number] Date of Birth: 09/11/1950   Medicare Important Message Given:  Yes    Joelle Flessner A, RN 12/24/2014, 9:43 AM

## 2014-12-24 NOTE — Progress Notes (Signed)
Dr.Menz here to see patient, discussing plan for surgery on Tuesday.

## 2014-12-25 ENCOUNTER — Other Ambulatory Visit: Payer: Self-pay | Admitting: Pain Medicine

## 2014-12-25 LAB — CBC
HEMATOCRIT: 30.8 % — AB (ref 35.0–47.0)
HEMOGLOBIN: 10.5 g/dL — AB (ref 12.0–16.0)
MCH: 29.1 pg (ref 26.0–34.0)
MCHC: 34.3 g/dL (ref 32.0–36.0)
MCV: 84.7 fL (ref 80.0–100.0)
Platelets: 616 10*3/uL — ABNORMAL HIGH (ref 150–440)
RBC: 3.63 MIL/uL — ABNORMAL LOW (ref 3.80–5.20)
RDW: 15.5 % — ABNORMAL HIGH (ref 11.5–14.5)
WBC: 8 10*3/uL (ref 3.6–11.0)

## 2014-12-25 LAB — PROTIME-INR
INR: 1.03
Prothrombin Time: 13.7 seconds (ref 11.4–15.0)

## 2014-12-25 MED ORDER — CEFAZOLIN SODIUM-DEXTROSE 2-3 GM-% IV SOLR
2.0000 g | INTRAVENOUS | Status: AC
  Administered 2014-12-26: 2 g via INTRAVENOUS
  Filled 2014-12-25: qty 50

## 2014-12-25 NOTE — Progress Notes (Signed)
[facility] Physicians - [place] at [facility]   PATIENT NAME: Kelly Foster    MR#:  [id number]  DATE OF BIRTH:  [birth year]  SUBJECTIVE:  CHIEF COMPLAINT:   Chief Complaint  Patient presents with  . Hip Pain     Schedule for sx at [facility] next month for joint replacement, but had severe pain , can not walk.   Pain still remains same, can not transfer herself from bed to chair.   Seen by ortho- and Lt THA sx is scheduled for Tuesday.   No complains- waiting for sx..  no new changes- waiting for surgery.  REVIEW OF SYSTEMS:  CONSTITUTIONAL: No fever, fatigue or weakness.  EYES: No blurred or double vision.  EARS, NOSE, AND THROAT: No tinnitus or ear pain.  RESPIRATORY: No cough, shortness of breath, wheezing or hemoptysis.  CARDIOVASCULAR: No chest pain, orthopnea, edema.  GASTROINTESTINAL: No nausea, vomiting, diarrhea or abdominal pain.  GENITOURINARY: No dysuria, hematuria.  ENDOCRINE: No polyuria, nocturia,  HEMATOLOGY: No anemia, easy bruising or bleeding SKIN: No rash or lesion. MUSCULOSKELETAL: severe LEFT hip joint pain or arthritis.  Have b/l leg swelling- little better. NEUROLOGIC: No tingling, numbness, weakness.  PSYCHIATRY: No anxiety or depression.   ROS  DRUG ALLERGIES:   Allergies  Allergen Reactions  . Codeine Itching  . Tramadol Itching    VITALS:  Blood pressure 94/48, pulse 88, temperature 98.3 F (36.8 C), temperature source Oral, resp. rate 18, height  (1.702 m), weight 90.719 kg (200 lb), SpO2 96 %.  PHYSICAL EXAMINATION:  GENERAL:  64 y.o.-year-old patient lying in the bed with no acute distress.  EYES: Pupils equal, round, reactive to light and accommodation. No scleral icterus. Extraocular muscles intact.  HEENT: Head atraumatic, normocephalic. Oropharynx and nasopharynx clear.  NECK:  Supple, no jugular venous distention. No thyroid enlargement, no tenderness.  LUNGS: Normal breath sounds bilaterally, no wheezing,  rales,rhonchi or crepitation. No use of accessory muscles of respiration.  CARDIOVASCULAR: S1, S2 normal. No murmurs, rubs, or gallops.  ABDOMEN: Soft, nontender, nondistended. Bowel sounds present. No organomegaly or mass.  EXTREMITIES: positive b/l pedal edema, cyanosis, or clubbing.LT  Hip joint movement restricted due to pain. NEUROLOGIC: face on lover part looks slightly weak on left side.. Muscle strength 5/5 in all extremities. Sensation intact. Gait not checked. Not moving lower extrimities much due to pain. PSYCHIATRIC: The patient is alert and oriented x 3.  SKIN: No obvious rash, lesion, or ulcer.   Physical Exam LABORATORY PANEL:   CBC  Recent Labs Lab 12/25/14 0353  WBC 8.0  HGB 10.5*  HCT 30.8*  PLT 616*   ------------------------------------------------------------------------------------------------------------------  Chemistries   Recent Labs Lab 12/23/14 0415  NA 135  K 3.9  CL 99*  CO2 30  GLUCOSE 105*  BUN <5*  CREATININE 0.48  CALCIUM 8.6*   ------------------------------------------------------------------------------------------------------------------  Cardiac Enzymes No results for input(s): TROPONINI in the last 168 hours. ------------------------------------------------------------------------------------------------------------------  RADIOLOGY:  No results found.  ASSESSMENT AND PLAN:   Principal Problem:   Intractable pain  64 year old African-American female history of osteoarthritis who is presenting with worsening hip pain.  1. Intractable pain, left hip/degenerative joint disease: Provide pain medication, bowel regimen,   appreciated orthopedic consult, patient agrees to have her hip joint replacement - planned for [place]. NPO after [place]   2. Hyponatremia: Resolved with ivf    3. GERD without esophagitis: PPI therapy 4. Hyperlipidemia unspecified: Statin therapy 5. Venous thrombus embolism prophylactic: Heparin  subcutaneous, hold for  sx  6. B/l leg swelling- Doppler studies.- Ruled out DVT.   Likely the swelling is because of immobilization.  7.lower face wekaness, no other neurologial findings.     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  All the records are reviewed and case discussed with Care Management/Social Workerr. Management plans discussed with the patient, family and they are in agreement.  CODE STATUS: full  TOTAL TIME TAKING CARE OF THIS PATIENT: 25 mins.    POSSIBLE D/C IN 3-4 d, DEPENDING ON CLINICAL CONDITION.   Ramonita LabGouru, Javonnie Illescas M.D on 12/25/2014   Between 7am to 6pm - Pager - [phone]  After 6pm go to [email or web address] - password [email or web address]  Fabio Neighborsagle [place] [facility]  Office  [phone]  CC: Primary care physician; Hyman HopesBurns, Harriett P, MD  Note: This dictation was prepared with Dragon dictation along with smaller phrase technology. Any transcriptional errors that result from this process are unintentional.

## 2014-12-25 NOTE — Progress Notes (Signed)
Plan THA on left tomorrow.

## 2014-12-25 NOTE — Progress Notes (Signed)
Physical Therapy Treatment Patient Details Name: Kelly Foster MRN: [id number] DOB: 06/29/1950 Today's Date: 12/25/2014    History of Present Illness Pt is a 64 yo female who was admitted to the hospital due to intractable L hip pain. Pt is unable to take care of herself or ambulate secondary to pain. [facility]. [facility].    PT Comments    Pt is making limited progress towards goals with pt very lethargic this date. Pt requires multiple cues to stay awake during therapy. Pt able to participate in there-ex and stand pivot transfer to recliner. Unable to progress ambulation this date. Scheduled L anterior THA for tomorrow by Dr. Rosita KeaMenz.  Follow Up Recommendations  SNF     Equipment Recommendations  Rolling walker with 5" wheels    Recommendations for Other Services       Precautions / Restrictions Precautions Precautions: Fall Restrictions Weight Bearing Restrictions: No    Mobility  Bed Mobility Overal bed mobility: Needs Assistance;+2 for physical assistance Bed Mobility: Supine to Sit     Supine to sit: Max assist;+2 for physical assistance     General bed mobility comments: assist for B LEs and heavy cues for B UEs. Safe technique performed. Pt grimaces in pain with movement. Once seated at EOB, pt requires constant min assist for maintaining sitting balance.  Transfers Overall transfer level: Needs assistance Equipment used: Rolling walker (2 wheeled) Transfers: Sit to/from Stand Sit to Stand: Max assist;+2 physical assistance         General transfer comment: Pt unable to perform standing. Stand pivot transfer made to recliner with max + 2 assist. Heavy cues for sequencing used.  Ambulation/Gait             General Gait Details: unable at this time secondary to lethargy   Stairs            Wheelchair Mobility    Modified Rankin (Stroke Patients Only)       Balance                                     Cognition Arousal/Alertness: Lethargic Behavior During Therapy: Flat affect Overall Cognitive Status: Difficult to assess                      Exercises Other Exercises Other Exercises: Pt performed L LE ther-ex including SLRs, quad sets, and ankle pumps. R LE ther-ex performed including resisted heel sides, quad sets, and ankle pumps. All ther-ex performed 10-12 reps with heavy cues for participation as pt very lethargic.    General Comments        Pertinent Vitals/Pain Pain Assessment: 0-10 Pain Score: 10-Worst pain ever Pain Location: L hip Pain Descriptors / Indicators: Constant;Nagging;Sore Pain Intervention(s): Limited activity within patient's tolerance;Repositioned    Home Living                      Prior Function            PT Goals (current goals can now be found in the care plan section) Acute Rehab PT Goals PT Goal Formulation: With patient Time For Goal Achievement: 01/04/15 Potential to Achieve Goals: Fair Progress towards PT goals: Progressing toward goals    Frequency  Min 2X/week    PT Plan Current plan  remains appropriate    Co-evaluation             End of Session Equipment Utilized During Treatment: Gait belt Activity Tolerance: Patient limited by lethargy;Patient limited by pain Patient left: in chair (no alarm box present, RN informed)     Time: 9604-5409 PT Time Calculation (min) (ACUTE ONLY): 27 min  Charges:  $Therapeutic Exercise: 8-22 mins $Therapeutic Activity: 8-22 mins                    G Codes:      Janelys Glassner Jan 18, 2015, 11:49 AM  Elizabeth Palau, PT, DPT [phone]

## 2014-12-26 ENCOUNTER — Inpatient Hospital Stay: Payer: Medicare Other | Admitting: Anesthesiology

## 2014-12-26 ENCOUNTER — Encounter: Payer: Self-pay | Admitting: *Deleted

## 2014-12-26 ENCOUNTER — Inpatient Hospital Stay: Payer: Medicare Other

## 2014-12-26 ENCOUNTER — Encounter
Admission: EM | Disposition: A | Discharge status: Skilled Nursing Facility | Payer: Self-pay | Source: Home / Self Care | Attending: Internal Medicine

## 2014-12-26 HISTORY — PX: TOTAL HIP ARTHROPLASTY: SHX124

## 2014-12-26 LAB — BASIC METABOLIC PANEL
Anion gap: 9 (ref 5–15)
CALCIUM: 9.4 mg/dL (ref 8.9–10.3)
CHLORIDE: 96 mmol/L — AB (ref 101–111)
CO2: 28 mmol/L (ref 22–32)
CREATININE: 0.51 mg/dL (ref 0.44–1.00)
GFR calc non Af Amer: >60 mL/min (ref >60)
Glucose, Bld: 86 mg/dL (ref 65–99)
Potassium: 4.2 mmol/L (ref 3.5–5.1)
SODIUM: 133 mmol/L — AB (ref 135–145)

## 2014-12-26 LAB — CBC
HCT: 36.1 % (ref 35.0–47.0)
Hemoglobin: 11.7 g/dL — ABNORMAL LOW (ref 12.0–16.0)
MCH: 27.9 pg (ref 26.0–34.0)
MCHC: 32.4 g/dL (ref 32.0–36.0)
MCV: 86.3 fL (ref 80.0–100.0)
Platelets: 610 10*3/uL — ABNORMAL HIGH (ref 150–440)
RBC: 4.18 MIL/uL (ref 3.80–5.20)
RDW: 15.8 % — AB (ref 11.5–14.5)
WBC: 10.2 10*3/uL (ref 3.6–11.0)

## 2014-12-26 LAB — TYPE AND SCREEN
ABO/RH(D): B NEG
ANTIBODY SCREEN: NEGATIVE

## 2014-12-26 LAB — ABO/RH: ABO/RH(D): B NEG

## 2014-12-26 SURGERY — ARTHROPLASTY, HIP, TOTAL, ANTERIOR APPROACH
Anesthesia: Monitor Anesthesia Care | Site: Hip | Laterality: Left | Wound class: Clean

## 2014-12-26 MED ORDER — MAGNESIUM HYDROXIDE 400 MG/5ML PO SUSP
30.0000 mL | Freq: Every day | ORAL | Status: DC | PRN
Start: 2014-12-26 — End: 2014-12-28
  Administered 2014-12-27: 30 mL via ORAL
  Filled 2014-12-26: qty 30

## 2014-12-26 MED ORDER — ONDANSETRON HCL 4 MG/2ML IJ SOLN: 4.0000 mg | Freq: Once | INTRAMUSCULAR | Status: DC | PRN

## 2014-12-26 MED ORDER — TRANEXAMIC ACID 1000 MG/10ML IV SOLN
1000.0000 mg | INTRAVENOUS | Status: DC | PRN
  Administered 2014-12-26: 1000 mg via INTRAVENOUS

## 2014-12-26 MED ORDER — SODIUM CHLORIDE 0.9 % IV SOLN
INTRAVENOUS | Status: DC
  Administered 2014-12-26: 20:00:00 via INTRAVENOUS

## 2014-12-26 MED ORDER — MIDAZOLAM HCL 5 MG/5ML IJ SOLN
INTRAMUSCULAR | Status: DC | PRN
  Administered 2014-12-26: 2 mg via INTRAVENOUS

## 2014-12-26 MED ORDER — CEFAZOLIN SODIUM-DEXTROSE 2-3 GM-% IV SOLR
2.0000 g | Freq: Four times a day (QID) | INTRAVENOUS | Status: AC
  Administered 2014-12-26 – 2014-12-27 (×3): 2 g via INTRAVENOUS
  Filled 2014-12-26 (×3): qty 50

## 2014-12-26 MED ORDER — LIDOCAINE HCL (CARDIAC) 20 MG/ML IV SOLN
INTRAVENOUS | Status: DC | PRN
  Administered 2014-12-26: 100 mg via INTRAVENOUS

## 2014-12-26 MED ORDER — PHENYLEPHRINE HCL 10 MG/ML IJ SOLN
INTRAMUSCULAR | Status: DC | PRN
  Administered 2014-12-26: 100 ug via INTRAVENOUS
  Administered 2014-12-26 (×3): 200 ug via INTRAVENOUS

## 2014-12-26 MED ORDER — ACETAMINOPHEN 500 MG PO TABS
1000.0000 mg | ORAL_TABLET | Freq: Four times a day (QID) | ORAL | Status: AC
  Administered 2014-12-26 – 2014-12-27 (×4): 1000 mg via ORAL
  Filled 2014-12-26 (×4): qty 2

## 2014-12-26 MED ORDER — NEOMYCIN-POLYMYXIN B GU 40-200000 IR SOLN
Status: DC | PRN
  Administered 2014-12-26: 4 mL

## 2014-12-26 MED ORDER — SODIUM CHLORIDE 0.9 % IV SOLN
INTRAVENOUS | Status: DC
  Administered 2014-12-26 (×2): via INTRAVENOUS

## 2014-12-26 MED ORDER — FENTANYL CITRATE (PF) 100 MCG/2ML IJ SOLN
INTRAMUSCULAR | Status: DC | PRN
  Administered 2014-12-26: 25 ug via INTRAVENOUS

## 2014-12-26 MED ORDER — MENTHOL 3 MG MT LOZG
1.0000 | LOZENGE | OROMUCOSAL | Status: DC | PRN
  Filled 2014-12-26: qty 9

## 2014-12-26 MED ORDER — PROPOFOL 500 MG/50ML IV EMUL
INTRAVENOUS | Status: DC | PRN
  Administered 2014-12-26: 100 ug/kg/min via INTRAVENOUS

## 2014-12-26 MED ORDER — BISACODYL 10 MG RE SUPP: 10.0000 mg | Freq: Every day | RECTAL | Status: DC | PRN

## 2014-12-26 MED ORDER — BUPIVACAINE-EPINEPHRINE 0.25% -1:200000 IJ SOLN
INTRAMUSCULAR | Status: DC | PRN
  Administered 2014-12-26: 30 mL

## 2014-12-26 MED ORDER — FENTANYL CITRATE (PF) 100 MCG/2ML IJ SOLN
25.0000 ug | INTRAMUSCULAR | Status: DC | PRN
  Administered 2014-12-26 (×3): 50 ug via INTRAVENOUS

## 2014-12-26 MED ORDER — ENOXAPARIN SODIUM 40 MG/0.4ML ~~LOC~~ SOLN
40.0000 mg | SUBCUTANEOUS | Status: DC
  Administered 2014-12-27 – 2014-12-28 (×2): 40 mg via SUBCUTANEOUS
  Filled 2014-12-26 (×3): qty 0.4

## 2014-12-26 MED ORDER — PROPOFOL 10 MG/ML IV BOLUS
INTRAVENOUS | Status: DC | PRN
  Administered 2014-12-26: 40 mg via INTRAVENOUS

## 2014-12-26 MED ORDER — PHENOL 1.4 % MT LIQD
1.0000 | OROMUCOSAL | Status: DC | PRN
  Filled 2014-12-26: qty 177

## 2014-12-26 MED ORDER — MAGNESIUM CITRATE PO SOLN
1.0000 | Freq: Once | ORAL | Status: DC | PRN
  Filled 2014-12-26: qty 296

## 2014-12-26 MED ORDER — ALUM & MAG HYDROXIDE-SIMETH 200-200-20 MG/5ML PO SUSP: 30.0000 mL | ORAL | Status: DC | PRN

## 2014-12-26 SURGICAL SUPPLY — 43 items
BLADE SAW 1/2 (BLADE) ×3 IMPLANT
BNDG COHESIVE 6X5 TAN STRL LF (GAUZE/BANDAGES/DRESSINGS) ×6 IMPLANT
CANISTER SUCT 1200ML W/VALVE (MISCELLANEOUS) ×3 IMPLANT
CAPT HIP TOTAL 3 ×3 IMPLANT
CATH FOL LEG HOLDER (MISCELLANEOUS) ×3 IMPLANT
CATH TRAY METER 16FR LF (MISCELLANEOUS) ×3 IMPLANT
CHLORAPREP W/TINT 26ML (MISCELLANEOUS) ×3 IMPLANT
DRAPE C-ARM XRAY 36X54 (DRAPES) ×3 IMPLANT
DRAPE INCISE IOBAN 66X60 STRL (DRAPES) IMPLANT
DRAPE POUCH INSTRU U-SHP 10X18 (DRAPES) ×3 IMPLANT
DRAPE SHEET LG 3/4 BI-LAMINATE (DRAPES) ×9 IMPLANT
DRAPE TABLE BACK 80X90 (DRAPES) ×3 IMPLANT
ELECT BLADE 6.5 EXT (BLADE) ×3 IMPLANT
GAUZE SPONGE 4X4 12PLY STRL (GAUZE/BANDAGES/DRESSINGS) ×3 IMPLANT
GLOVE BIOGEL PI IND STRL 9 (GLOVE) ×1 IMPLANT
GLOVE BIOGEL PI INDICATOR 9 (GLOVE) ×2
GLOVE SURG ORTHO 9.0 STRL STRW (GLOVE) ×3 IMPLANT
GOWN SPECIALTY ULTRA XL (MISCELLANEOUS) ×3 IMPLANT
GOWN STRL REUS W/ TWL LRG LVL3 (GOWN DISPOSABLE) ×1 IMPLANT
GOWN STRL REUS W/TWL LRG LVL3 (GOWN DISPOSABLE) ×2
HEMOVAC 400CC 10FR (MISCELLANEOUS) ×3 IMPLANT
HOOD PEEL AWAY FACE SHEILD DIS (HOOD) ×3 IMPLANT
MAT BLUE FLOOR 46X72 FLO (MISCELLANEOUS) ×3 IMPLANT
NDL SAFETY 18GX1.5 (NEEDLE) ×3 IMPLANT
NEEDLE SPNL 18GX3.5 QUINCKE PK (NEEDLE) ×3 IMPLANT
NS IRRIG 1000ML POUR BTL (IV SOLUTION) ×3 IMPLANT
PACK HIP COMPR (MISCELLANEOUS) ×3 IMPLANT
SOL PREP PVP 2OZ (MISCELLANEOUS) ×3
SOLUTION PREP PVP 2OZ (MISCELLANEOUS) ×1 IMPLANT
STAPLER SKIN PROX 35W (STAPLE) ×3 IMPLANT
STRAP SAFETY BODY (MISCELLANEOUS) ×3 IMPLANT
SUT DVC 2 QUILL PDO  T11 36X36 (SUTURE) ×2
SUT DVC 2 QUILL PDO T11 36X36 (SUTURE) ×1 IMPLANT
SUT DVC QUILL MONODERM 30X30 (SUTURE) ×3 IMPLANT
SUT ETHIBOND NAB CT1 #1 30IN (SUTURE) ×3 IMPLANT
SUT SILK 0 (SUTURE) ×2
SUT SILK 0 30XBRD TIE 6 (SUTURE) ×1 IMPLANT
SUT VIC AB 1 CT1 36 (SUTURE) ×3 IMPLANT
SYR 20CC LL (SYRINGE) ×3 IMPLANT
SYR 30ML LL (SYRINGE) ×3 IMPLANT
TAPE MICROFOAM 4IN (TAPE) ×3 IMPLANT
TUBE KAMVAC SUCTION (TUBING) ×3 IMPLANT
WATER STERILE IRR 1000ML POUR (IV SOLUTION) ×3 IMPLANT

## 2014-12-26 NOTE — Progress Notes (Signed)
[facility] Physicians - [place] at [facility]   PATIENT NAME: Kelly Foster    MR#:  [id number]  DATE OF BIRTH:  [birth year]  SUBJECTIVE:  CHIEF COMPLAINT:   Chief Complaint  Patient presents with  . Hip Pain     Schedule for sx at [facility] next month for joint replacement, but had severe pain , can not walk.   Pain still remains same, can not transfer herself from bed to chair.   Seen by ortho- and Lt THA sx is scheduled today.   No complains- waiting for sx.Marland Kitchen   REVIEW OF SYSTEMS:  CONSTITUTIONAL: No fever, fatigue or weakness.  EYES: No blurred or double vision.  EARS, NOSE, AND THROAT: No tinnitus or ear pain.  RESPIRATORY: No cough, shortness of breath, wheezing or hemoptysis.  CARDIOVASCULAR: No chest pain, orthopnea, edema.  GASTROINTESTINAL: No nausea, vomiting, diarrhea or abdominal pain.  GENITOURINARY: No dysuria, hematuria.  ENDOCRINE: No polyuria, nocturia,  HEMATOLOGY: No anemia, easy bruising or bleeding SKIN: No rash or lesion. MUSCULOSKELETAL: severe LEFT hip joint pain or arthritis.  Have b/l leg swelling- little better. NEUROLOGIC: No tingling, numbness, weakness.  PSYCHIATRY: No anxiety or depression.   ROS  DRUG ALLERGIES:   Allergies  Allergen Reactions  . Codeine Itching  . Tramadol Itching    VITALS:  Blood pressure 122/62, pulse 88, temperature 99.2 F (37.3 C), temperature source Tympanic, resp. rate 20, height  (1.702 m), weight 90.719 kg (200 lb), SpO2 97 %.  PHYSICAL EXAMINATION:  GENERAL:  64 y.o.-year-old patient lying in the bed with no acute distress.  EYES: Pupils equal, round, reactive to light and accommodation. No scleral icterus. Extraocular muscles intact.  HEENT: Head atraumatic, normocephalic. Oropharynx and nasopharynx clear.  NECK:  Supple, no jugular venous distention. No thyroid enlargement, no tenderness.  LUNGS: Normal breath sounds bilaterally, no wheezing, rales,rhonchi or crepitation. No use of accessory  muscles of respiration.  CARDIOVASCULAR: S1, S2 normal. No murmurs, rubs, or gallops.  ABDOMEN: Soft, nontender, nondistended. Bowel sounds present. No organomegaly or mass.  EXTREMITIES: positive b/l pedal edema, cyanosis, or clubbing.LT  Hip joint movement restricted due to pain. NEUROLOGIC: face on lover part looks slightly weak on left side.. Muscle strength 5/5 in all extremities. Sensation intact. Gait not checked. Not moving lower extrimities much due to pain. PSYCHIATRIC: The patient is alert and oriented x 3.  SKIN: No obvious rash, lesion, or ulcer.   Physical Exam LABORATORY PANEL:   CBC  Recent Labs Lab 12/26/14 0453  WBC 10.2  HGB 11.7*  HCT 36.1  PLT 610*   ------------------------------------------------------------------------------------------------------------------  Chemistries   Recent Labs Lab 12/26/14 0453  NA 133*  K 4.2  CL 96*  CO2 28  GLUCOSE 86  BUN <5*  CREATININE 0.51  CALCIUM 9.4   ------------------------------------------------------------------------------------------------------------------  Cardiac Enzymes No results for input(s): TROPONINI in the last 168 hours. ------------------------------------------------------------------------------------------------------------------  RADIOLOGY:  No results found.  ASSESSMENT AND PLAN:   Principal Problem:   Intractable pain  64 year old African-American female history of osteoarthritis who is presenting with worsening hip pain.  1. Intractable pain, left hip/degenerative joint disease:  Provide pain medication, bowel regimen,   appreciated orthopedic consult by Dr. Levie Heritage,  patient agrees to have her hip joint replacement today NPO  PT consult is pending Provide incentive spirometry postoperatively to prevent atelectasis   2. Hyponatremia: Resolved with ivf    3. GERD without esophagitis: PPI therapy 4. Hyperlipidemia unspecified: Statin therapy 5. Venous thrombus embolism  prophylactic: Heparin subcutaneous, hold for sx  6. B/l leg swelling- Doppler studies.- Ruled out DVT.   Likely the swelling is because of immobilization.  7.lower face wekaness, no other neurologial findings.     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  All the records are reviewed and case discussed with Care Management/Social Workerr. Management plans discussed with the patient, family and they are in agreement.  CODE STATUS: full  TOTAL TIME TAKING CARE OF THIS PATIENT: 25 mins.    POSSIBLE D/C IN 3-4 d, DEPENDING ON CLINICAL CONDITION.   Ramonita LabGouru, Tye Juarez M.D on 12/26/2014   Between 7am to 6pm - Pager - [phone]  After 6pm go to [email or web address] - password [email or web address]  Fabio Neighborsagle [place] [facility]  Office  [phone]  CC: Primary care physician; Hyman HopesBurns, Harriett P, MD  Note: This dictation was prepared with Dragon dictation along with smaller phrase technology. Any transcriptional errors that result from this process are unintentional.

## 2014-12-26 NOTE — Anesthesia Preprocedure Evaluation (Signed)
Anesthesia Evaluation  Patient identified by MRN, date of birth, ID band Patient awake    Reviewed: Allergy & Precautions, H&P , NPO status , Patient's Chart, lab work & pertinent test results, reviewed documented beta blocker date and time   History of Anesthesia Complications Negative for: history of anesthetic complications  Airway Mallampati: II  TM Distance: >3 FB Neck ROM: full    Dental no notable dental hx. (+) Edentulous Upper, Poor Dentition, Missing   Pulmonary neg pulmonary ROS, former smoker,    Pulmonary exam normal breath sounds clear to auscultation       Cardiovascular Exercise Tolerance: Good hypertension, negative cardio ROS Normal cardiovascular exam Rhythm:regular Rate:Normal     Neuro/Psych neg Seizures PSYCHIATRIC DISORDERS (bipolar, depression, anxiety)  Neuromuscular disease (neuropathy)    GI/Hepatic negative GI ROS, Neg liver ROS,   Endo/Other  diabetes (Pre-diabetes)  Renal/GU negative Renal ROS  negative genitourinary   Musculoskeletal   Abdominal   Peds  Hematology negative hematology ROS (+)   Anesthesia Other Findings Past Medical History:   Allergy                                                      Anxiety                                                      Hypertension                                                 Bipolar 1 disorder (HCC)                                     Manic depressive disorder (HCC)                              Hyperlipidemia                                               DJD (degenerative joint disease)                             Hip pain                                                     Prediabetes                                                  Arthritis  Ulnar neuropathy                                             Urinary frequency                                            Overweight                                                    Vaginal atrophy                                              Reproductive/Obstetrics negative OB ROS                             Anesthesia Physical Anesthesia Plan  ASA: III  Anesthesia Plan: MAC and Spinal   Post-op Pain Management:    Induction:   Airway Management Planned:   Additional Equipment:   Intra-op Plan:   Post-operative Plan:   Informed Consent: I have reviewed the patients History and Physical, chart, labs and discussed the procedure including the risks, benefits and alternatives for the proposed anesthesia with the patient or authorized representative who has indicated his/her understanding and acceptance.   Dental Advisory Given  Plan Discussed with: Anesthesiologist, CRNA and Surgeon  Anesthesia Plan Comments:         Anesthesia Quick Evaluation

## 2014-12-26 NOTE — Anesthesia Procedure Notes (Signed)
Spinal Patient location during procedure: OR Start time: 12/26/2014 12:31 PM End time: 12/26/2014 12:43 PM Staffing Anesthesiologist: Martha Clan Resident/CRNA: Herbie Lehrmann, [place] [employer] Performed by: anesthesiologist and resident/CRNA  Preanesthetic Checklist Completed: patient identified, site marked, surgical consent, pre-op evaluation, timeout performed, IV checked, risks and benefits discussed and monitors and equipment checked Spinal Block Patient position: sitting Prep: Betadine Patient monitoring: heart rate, continuous pulse ox, blood pressure and cardiac monitor Approach: midline Location: L3-4 Injection technique: single-shot Needle Needle type: Whitacre and Introducer  Needle gauge: 25 G Needle length: 9 cm Additional Notes Negative paresthesia. Negative blood return. Positive free-flowing CSF. Expiration date of kit checked and confirmed. Patient tolerated procedure well, without complications.

## 2014-12-26 NOTE — Op Note (Signed)
12/20/2014 - 12/26/2014  2:42 PM  PATIENT:  Kelly Foster  64 y.o. 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  POST-OPERATIVE DIAGNOSIS:  same  PROCEDURE:  Procedure(s): TOTAL HIP ARTHROPLASTY ANTERIOR APPROACH (Left)  SURGEON: Leitha SchullerMichael J Doha Boling, MD  ASSISTANTS: None  ANESTHESIA:   spinal  EBL:  Total I/O In: 1300 [I.V.:1300] Out: 550 [Urine:100; Blood:450]  BLOOD ADMINISTERED:none  DRAINS: (2) Hemovact drain(s) in the Subcutaneous layer with  Suction Open   LOCAL MEDICATIONS USED:  MARCAINE     SPECIMEN:  Source of Specimen:  Femoral head left hip  DISPOSITION OF SPECIMEN:  PATHOLOGY  COUNTS:  YES  TOURNIQUET:  * No tourniquets in log *  IMPLANTS: Medacta AMIS 2 standard stem with 48 mm mPACT cup DM and S head, with liner for 48 mm cup  DICTATION: .Dragon Dictation   The patient was brought to the operating room and after spinal anesthesia was obtained patient  was placed on the operative table with the ipsilateral foot into the Medacta attachment, contralateral leg on a well-padded table. C-arm was brought in and preop template x-ray taken.  preop template was also taken of the right hip that did not have deformity. After prepping and draping in usual sterile fashion appropriate patient identification and timeout procedures were completed. Anterior approach to the hip was obtained and centered over the greater trochanter and TFL muscle. The subcutaneous tissue was incised hemostasis being achieved by electrocautery. TFL fascia was incised and the muscle retracted laterally deep retractor placed. The lateral femoral circumflex vessels were identified and ligated. The anterior capsule was exposed and a capsulotomy performed. The neck was identified and a femoral neck cut carried out with a saw. The head was removed without difficulty and showed sclerotic femoral head, and collapse of the head  and acetabulum. Reaming was carried out  to 48  mm and a 48 MPACTmm cup trial gave appropriate tightness to the acetabular component a 48 Mpact DM cup was impacted into position. The leg was then externally rotated and ischiofemoral and PUBOfemoral releases carried out. The femur was sequentially broached to a size 2, size 2 stem with S head  trials were placed and the final components chosen. The 2 standard  stem was inserted along with a S  28 mm head and 48  mm liner. The hip was reduced and was stable the wound was thoroughly irrigated with a dilute Betadine solution. The deep fasciawas closed using  a heavy Quill after infiltration of 30 cc of quarter percent Sensorcaine with epinephrine. Subcutaneous drains were then inserteD, 2-0 Quill suture l to close the skin with skin staples Xeroform  and honeycomb dressing applied   PLAN OF CARE: Continue as inpatient

## 2014-12-26 NOTE — Progress Notes (Signed)
   12/26/14 1117  Clinical Encounter Type  Visited With Patient  Visit Type Initial  Consult/Referral To Chaplain  Chaplain rounded in unit and offered pastoral care.   Chaplain Faline Langer [phone]

## 2014-12-26 NOTE — Progress Notes (Signed)
PT Cancellation Note  Patient Details Name: Adalberto Coleeggy Jean Mallinger MRN: [id number] DOB: 03/20/1950   Cancelled Treatment:    Reason Eval/Treat Not Completed: Other (comment). Pt with hip Sx this date, will need new orders for therapy intervention.   Rilla Buckman 12/26/2014, 3:59 PM Elizabeth PalauStephanie Porsche Noguchi, PT, DPT [phone]

## 2014-12-26 NOTE — Progress Notes (Signed)
Plan is for patient to have surgery today. PT will work with patient and make recommendation. [facility] if needed. Clinical Social Worker (CSW) will continue to follow and assist as needed.   Jetta LoutBailey Morgan, LCSWA [phone]

## 2014-12-26 NOTE — Care Management Important Message (Signed)
Important Message  Patient Details  Name: Kelly Foster MRN: [id number] Date of Birth: 08/30/1950   Medicare Important Message Given:  Yes    Olegario MessierKathy A Shlomo Seres 12/26/2014, 11:04 AM

## 2014-12-26 NOTE — Transfer of Care (Signed)
Immediate Anesthesia Transfer of Care Note  Patient: Adalberto Coleeggy Jean Windmiller  Procedure(s) Performed: Procedure(s): TOTAL HIP ARTHROPLASTY ANTERIOR APPROACH (Left)  Patient Location: PACU  Anesthesia Type:General and Spinal  Level of Consciousness: awake, alert , oriented and patient cooperative  Airway & Oxygen Therapy: Patient Spontanous Breathing and Patient connected to face mask oxygen  Post-op Assessment: Report given to RN, Post -op Vital signs reviewed and stable and Patient moving all extremities X 4  Post vital signs: Reviewed and stable  Last Vitals:  Filed Vitals:   12/26/14 1126 12/26/14 1439  BP: 122/62 104/69  Pulse: 88 107  Temp: 37.3 C 37.4 C  Resp: 20 16    Complications: No apparent anesthesia complications

## 2014-12-26 NOTE — Progress Notes (Signed)
Initial Nutrition Assessment   INTERVENTION:   Meals and Snacks: Cater to patient preferences Medical Food Supplement Therapy: will recommend on follow if intake poor  Coordination of Care: will request new weight   NUTRITION DIAGNOSIS:   Inadequate oral intake related to inability to eat as evidenced by NPO status.  GOAL:   Patient will meet greater than or equal to 90% of their needs  MONITOR:    (Energy Intake, Anthropometrics, Digestive System)  REASON FOR ASSESSMENT:   LOS    ASSESSMENT:   Pt admitted with hip pain. Pt scheduled for left hip arthroplasty 12/26/2014.  Past Medical History  Diagnosis Date  . Allergy   . Anxiety   . Hypertension   . Bipolar 1 disorder (HCC)   . Manic depressive disorder (HCC)   . Hyperlipidemia   . DJD (degenerative joint disease)   . Hip pain   . Prediabetes   . Arthritis   . Ulnar neuropathy   . Urinary frequency   . Overweight   . Vaginal atrophy      Diet Order:  Diet NPO time specified    Current Nutrition: Pt currently NPO for procedure  Food/Nutrition-Related History: Pt eating 71% of meals on average recorded since admission. Per MST no decrease in appetite PTA   Scheduled Medications:  . azelastine  1 spray Each Nare BID  . divalproex  1,000 mg Oral QHS  . montelukast  10 mg Oral QHS  . oxybutynin  5 mg Oral BID  . pantoprazole  40 mg Oral Daily  . risperidone  4 mg Oral QHS  . senna-docusate  1 tablet Oral BID  . sertraline  200 mg Oral QHS  . simvastatin  40 mg Oral QHS  . traZODone  200 mg Oral QHS    Continuous Medications:  . sodium chloride 50 mL/hr at 12/26/14 1149     Electrolyte/Renal Profile and Glucose Profile:   Recent Labs Lab 12/21/14 0515 12/23/14 0415 12/26/14 0453  NA 132* 135 133*  K 3.6 3.9 4.2  CL 100* 99* 96*  CO2 24 30 28   BUN 5* <5* <5*  CREATININE 0.55 0.48 0.51  CALCIUM 8.5* 8.6* 9.4  GLUCOSE 108* 105* 86   Protein Profile: No results for input(s): ALBUMIN  in the last 168 hours.  Gastrointestinal Profile: Last BM:  12/25/2014   Weight Change: Per MST weight loss PTA. Per CHL 7% in 5 months possibly  Height:   Ht Readings from Last 1 Encounters:  12/26/14 5\' 7"  (1.702 m)    Weight:   Wt Readings from Last 1 Encounters:  12/26/14 200 lb (90.719 kg)   Wt Readings from Last 10 Encounters:  12/26/14 200 lb (90.719 kg)  12/07/14 200 lb (90.719 kg)  11/20/14 209 lb (94.802 kg)  10/31/14 209 lb (94.802 kg)  10/20/14 211 lb (95.709 kg)  07/25/14 216 lb 1.6 oz (98.022 kg)  06/29/14 214 lb (97.07 kg)     BMI:  Body mass index is 31.32 kg/(m^2).    LOW Care Level  Leda QuailAllyson Mitchell Iwanicki, [place]RD, [place] Pager [phone]

## 2014-12-27 ENCOUNTER — Encounter: Payer: Self-pay | Admitting: Orthopedic Surgery

## 2014-12-27 ENCOUNTER — Encounter
Admission: RE | Admit: 2014-12-27 | Discharge: 2014-12-27 | Disposition: A | Discharge status: Home / Self Care | Payer: Medicare Other | Source: Ambulatory Visit | Attending: Internal Medicine | Admitting: Internal Medicine

## 2014-12-27 LAB — GLUCOSE, CAPILLARY
Glucose-Capillary: 85 mg/dL (ref 65–99)
Glucose-Capillary: 106 mg/dL — ABNORMAL HIGH (ref 65–99)

## 2014-12-27 MED ORDER — ENOXAPARIN SODIUM 40 MG/0.4ML ~~LOC~~ SOLN: 40.0000 mg | SUBCUTANEOUS | Status: DC

## 2014-12-27 MED ORDER — OXYCODONE HCL 5 MG PO TABS: 5.0000 mg | ORAL_TABLET | ORAL | Status: DC | PRN

## 2014-12-27 NOTE — Anesthesia Postprocedure Evaluation (Signed)
Anesthesia Post Note  Patient: Kelly Foster  Procedure(s) Performed: Procedure(s) (LRB): TOTAL HIP ARTHROPLASTY ANTERIOR APPROACH (Left)  Patient location during evaluation: PACU Anesthesia Type: General Level of consciousness: awake and alert Pain management: pain level controlled Vital Signs Assessment: post-procedure vital signs reviewed and stable Respiratory status: spontaneous breathing, nonlabored ventilation, respiratory function stable and patient connected to nasal cannula oxygen Cardiovascular status: blood pressure returned to baseline and stable Postop Assessment: No signs of nausea or vomiting Anesthetic complications: no    Last Vitals:  Filed Vitals:   12/27/14 0442 12/27/14 0712  BP: 149/67 131/73  Pulse: 94 98  Temp: 37.2 C 37.3 C  Resp: 18 18    Last Pain:  Filed Vitals:   12/27/14 1204  PainSc: 8     LLE Motor Response: Purposeful movement LLE Sensation: Full sensation RLE Motor Response: Purposeful movement RLE Sensation: Full sensation      Lenard SimmerAndrew Rayhana Slider

## 2014-12-27 NOTE — Progress Notes (Signed)
Patient ID: Adalberto Coleeggy Jean Suit, female   DOB: 03/31/1950, 64 y.o.   MRN: [id number] [facility] [place] [facility]   PATIENT NAME: Kelly Foster    MR#:  [id number]  DATE OF BIRTH:  11/12/1950  SUBJECTIVE:  Doing ok. Working with PT  REVIEW OF SYSTEMS:   Review of Systems  Constitutional: Negative for fever, chills and weight loss.  HENT: Negative for ear discharge, ear pain and nosebleeds.   Eyes: Negative for blurred vision, pain and discharge.  Respiratory: Negative for sputum production, shortness of breath, wheezing and stridor.   Cardiovascular: Negative for chest pain, palpitations, orthopnea and PND.  Gastrointestinal: Negative for nausea, vomiting, abdominal pain and diarrhea.  Genitourinary: Negative for urgency and frequency.  Musculoskeletal: Positive for joint pain. Negative for back pain.  Neurological: Negative for sensory change, speech change, focal weakness and weakness.  Psychiatric/Behavioral: Negative for depression and hallucinations. The patient is not nervous/anxious.   All other systems reviewed and are negative.  Tolerating Diet:yes Tolerating PT: rec snf  DRUG ALLERGIES:   Allergies  Allergen Reactions  . Codeine Itching  . Tramadol Itching    VITALS:  Blood pressure 127/64, pulse 89, temperature 98.1 F (36.7 C), temperature source Oral, resp. rate 18, height 5\' 7"  (1.702 m), weight 93 kg (205 lb 0.4 oz), SpO2 92 %.  PHYSICAL EXAMINATION:   Physical Exam  GENERAL:  64 y.o.-year-old patient lying in the bed with no acute distress.  EYES: Pupils equal, round, reactive to light and accommodation. No scleral icterus. Extraocular muscles intact.  HEENT: Head atraumatic, normocephalic. Oropharynx and nasopharynx clear.  NECK:  Supple, no jugular venous distention. No thyroid enlargement, no tenderness.  LUNGS: Normal breath sounds bilaterally, no wheezing, rales, rhonchi. No use of accessory muscles of respiration.   CARDIOVASCULAR: S1, S2 normal. No murmurs, rubs, or gallops.  ABDOMEN: Soft, nontender, nondistended. Bowel sounds present. No organomegaly or mass.  EXTREMITIES: No cyanosis, clubbing or edema b/l.    NEUROLOGIC: Cranial nerves II through XII are intact. No focal Motor or sensory deficits b/l.   PSYCHIATRIC: The patient is alert and oriented x 3.  SKIN: No obvious rash, lesion, or ulcer.    LABORATORY PANEL:   CBC  Recent Labs Lab 12/26/14 0453  WBC 10.2  HGB 11.7*  HCT 36.1  PLT 610*    Chemistries   Recent Labs Lab 12/26/14 0453  NA 133*  K 4.2  CL 96*  CO2 28  GLUCOSE 86  BUN <5*  CREATININE 0.51  CALCIUM 9.4    Cardiac Enzymes No results for input(s): TROPONINI in the last 168 hours.  RADIOLOGY:  Dg Hip Operative Unilat W Or W/o Pelvis Left  12/26/2014  CLINICAL DATA:  Left hip replacement EXAM: OPERATIVE left HIP (WITH PELVIS IF PERFORMED) 3 VIEWS TECHNIQUE: Fluoroscopic spot image(s) were submitted for interpretation post-operatively. COMPARISON:  12/20/2014 FINDINGS: Three views of the left hip submitted. There is left hip prosthesis with anatomic alignment. IMPRESSION: Left hip prosthesis with anatomic alignment. Fluoroscopy time 30 seconds.  Please see the operative report Electronically Signed   By: Natasha MeadLiviu  Pop M.D.   On: 12/26/2014 14:44   Dg Hip Unilat W Or W/o Pelvis 2-3 Views Left  12/26/2014  CLINICAL DATA:  Status post left hip arthroplasty.  Postop imaging. EXAM: DG HIP (WITH OR WITHOUT PELVIS) 2-3V LEFT COMPARISON:  12/20/2014 FINDINGS: Two views show the new left hip total arthroplasty to be well seated and aligned. There is no acute  fracture or evidence of an operative complication. IMPRESSION: Well aligned left hip prosthesis. Electronically Signed   By: Amie Portland M.D.   On: 12/26/2014 15:28   ASSESSMENT AND PLAN:   64 year old African-American female history of osteoarthritis who is presenting with worsening hip pain.  1. Intractable  pain, left hip/degenerative joint disease:  POD#1 Doing well working with PT  2. Hyponatremia: Resolved with ivf   3. GERD without esophagitis: PPI therapy  4. Hyperlipidemia unspecified: Statin therapy  5. Venous thrombus embolism prophylactic: Heparin subcutaneous, hold for sx  Overall improving. D/c to rehab in am  Case discussed with Care Management/Social Worker. Management plans discussed with the patient, family and they are in agreement.  CODE STATUS: full  DVT Prophylaxis: lovenox  TOTAL TIME TAKING CARE OF THIS PATIENT: 25 minutes.  >50% time spent on counselling and coordination of care  POSSIBLE D/C IN 1 DAYS, DEPENDING ON CLINICAL CONDITION.   Kelly Foster M.D on 12/27/2014 at 4:27 PM  Between 7am to 6pm - Pager - [phone]  After 6pm go to [email or web address] - password [email or web address] [facility]  [place] [facility]  Office  [phone]  CC: Primary care physician; Hyman Hopes, MD

## 2014-12-27 NOTE — Evaluation (Signed)
Physical Therapy Re-Evaluation Patient Details Name: Kelly Foster MRN: [id number] DOB: 01/31/1951 Today's Date: 12/27/2014   History of Present Illness  Pt is a 64 yo female who was admitted to the hospital due to intractable L hip pain. Pt is unable to take care of herself or ambulate secondary to pain. She [facility]. Pt is now [facility] L [facility].  Clinical Impression  Pt seen for PT re-eval post surgery.  Prior to admission, pt was modified independent ambulating with AD.  Pt lives alone in 1 level home with 2 steps to enter with railings.  Currently pt requires assist for bed mobility and to take some steps bed to recliner with RW; limited activity d/t L hip pain.  Pt would benefit from skilled PT to address noted impairments and functional limitations.  Recommend pt discharge to STR when medically appropriate.     Follow Up Recommendations SNF    Equipment Recommendations  Rolling walker with 5" wheels    Recommendations for Other Services       Precautions / Restrictions Precautions Precautions: Fall Precaution Comments: Direct Anterior: no hip precautions per orders Restrictions Weight Bearing Restrictions: Yes LLE Weight Bearing: Weight bearing as tolerated      Mobility  Bed Mobility Overal bed mobility: Needs Assistance Bed Mobility: Supine to Sit     Supine to sit: Mod assist;+2 for physical assistance     General bed mobility comments: assist for B LE's and trunk; vc's to assist required  Transfers Overall transfer level: Needs assistance Equipment used: Rolling walker (2 wheeled) Transfers: Sit to/from Stand Sit to Stand: Min assist;+2 physical assistance         General transfer comment: vc's for hand and feet placement required  Ambulation/Gait Ambulation/Gait assistance: Min assist;+2 physical assistance Ambulation Distance (Feet): 3 Feet (bed to recliner) Assistive device: Rolling walker (2  wheeled) Gait Pattern/deviations: Step-to pattern;Antalgic;Decreased stance time - left Gait velocity: decreased   General Gait Details: vc's required for gait technique and walker use  Stairs            Wheelchair Mobility    Modified Rankin (Stroke Patients Only)       Balance Overall balance assessment: Needs assistance Sitting-balance support: Bilateral upper extremity supported;Feet supported Sitting balance-Leahy Scale: Fair     Standing balance support: Bilateral upper extremity supported (on RW) Standing balance-Leahy Scale: Fair                               Pertinent Vitals/Pain Pain Assessment: 0-10 Pain Score: 10-Worst pain ever Pain Location: L hip Pain Descriptors / Indicators: Constant;Aching;Sore;Tender Pain Intervention(s): Limited activity within patient's tolerance;Monitored during session;Premedicated before session;Repositioned  Vitals stable and WFL throughout treatment session.    Home Living Family/patient expects to be discharged to:: Skilled nursing facility Living Arrangements: Alone     Home Access: Stairs to enter Entrance Stairs-Rails: Right;Left;Can reach both Entrance Stairs-Number of Steps: 2 Home Layout: One level        Prior Function Level of Independence: Independent with assistive device(s)         Comments: Pt able to ambulate community distances with rollator walker or cane. Independent with ADLs.     Hand Dominance        Extremity/Trunk Assessment   Upper Extremity Assessment: Overall WFL for tasks assessed  Lower Extremity Assessment: RLE deficits/detail;LLE deficits/detail RLE Deficits / Details: strength and ROM WFL LLE Deficits / Details: hip flexion 3-/5; knee flexion/extension at least 3+/5; DF at least 3+/5     Communication   Communication: No difficulties  Cognition Arousal/Alertness: Awake/alert Behavior During Therapy: Flat affect Overall Cognitive Status: Within  Functional Limits for tasks assessed                      General Comments General comments (skin integrity, edema, etc.): dressing and hemovac in place  Nursing cleared pt for participation in physical therapy.  Pt agreeable to PT session.    Exercises        Assessment/Plan    PT Assessment Patient needs continued PT services  PT Diagnosis Difficulty walking;Acute pain;Abnormality of gait   PT Problem List Decreased strength;Decreased activity tolerance;Decreased balance;Decreased mobility;Decreased knowledge of use of DME;Pain  PT Treatment Interventions DME instruction;Gait training;Stair training;Functional mobility training;Therapeutic activities;Therapeutic exercise;Balance training;Neuromuscular re-education;Patient/family education;Manual techniques   PT Goals (Current goals can be found in the Care Plan section) Acute Rehab PT Goals Patient Stated Goal: to go to rehab PT Goal Formulation: With patient Time For Goal Achievement: 01/10/15 Potential to Achieve Goals: Good    Frequency BID   Barriers to discharge Decreased caregiver support      Co-evaluation               End of Session Equipment Utilized During Treatment: Gait belt Activity Tolerance: Patient limited by pain Patient left: in chair;with call bell/phone within reach;with SCD's reapplied (B heels elevated via pillow; unable to find/obtain chair alarm (nursing notified and pt educated on need to call for assist and not get up on her own)) Nurse Communication: Mobility status (no chair alarm able to be found)         Time: 1130-1150 PT Time Calculation (min) (ACUTE ONLY): 20 min   Charges:   PT Evaluation $PT Re-evaluation: 1 Procedure     PT G CodesHendricks Limes 01-09-2015, 12:11 PM Hendricks Limes, PT [phone]

## 2014-12-27 NOTE — Progress Notes (Addendum)
Plan is for patient to D/C to [facility] tomorrow Thursday 12/28/14. Per Kim admissions coordinator at [facility] patient can be admitted tomorrow to room 216-B. RN will call report at [phone] and arrange EMS for transport. Clinical Education officer, museum (CSW) sent D/C Summary, D/C packet and FL2 to [employer] via [employer] today. CSW gave report to RN. CSW met with patient and made her aware of above. Per patient she has notified her family of her likely D/C tomorrow. CSW attempted to call her son Leanah Kolander listed on the facesheet however he did not answer and his voicemail was full. CSW will continue to follow and assist as needed.   Blima Rich, [place] [phone]

## 2014-12-27 NOTE — [place] FL2 (Signed)
[place] MEDICAID FL2 LEVEL OF CARE SCREENING TOOL     IDENTIFICATION  Patient Name: Kelly Foster Birthdate: 08/31/1950 Sex: female Admission Date (Current Location): 12/20/2014  [facility] and [place] Number: Chiropodist and Address:  [facility], [address], [place], [place] [address]      Provider Number: [id number]  Attending Physician Name and Address:  Enedina Finner, MD  Relative Name and Phone Number:       Current Level of Care: Hospital Recommended Level of Care: Skilled Nursing Facility Prior Approval Number:    Date Approved/Denied:   PASRR Number:    Discharge Plan: SNF    Current Diagnoses: Patient Active Problem List   Diagnosis Date Noted  . Intractable pain 12/20/2014  . Urinary frequency 07/29/2014  . Neuritis or radiculitis due to rupture of lumbar intervertebral disc 05/29/2014  . Spinal stenosis 11/29/2013  . Extremity numbness 11/29/2013  . Degenerative arthritis of hip 07/01/2013  . 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 Bipolar 1 disorder, mixed (HCC) 06/06/2013    Orientation ACTIVITIES/SOCIAL BLADDER RESPIRATION    Self, Time, Situation, Place  Active Incontinent Normal  BEHAVIORAL SYMPTOMS/MOOD NEUROLOGICAL BOWEL NUTRITION STATUS   (none)  (none) Continent Diet  PHYSICIAN VISITS COMMUNICATION OF NEEDS Height & Weight Skin  30 days Verbally   200 lbs. Normal          AMBULATORY STATUS RESPIRATION    Supervision limited Normal      Personal Care Assistance Level of Assistance  Bathing, Dressing Bathing Assistance: Limited assistance   Dressing Assistance: Limited assistance      Functional Limitations Info                SPECIAL CARE FACTORS FREQUENCY  PT (By licensed PT)                   Additional Factors Info  Code Status, Allergies Code Status Info: Full Allergies Info: codeine tramadol           Current Medications (12/27/2014): Current  Facility-Administered Medications  Medication Dose Route Frequency Provider Last Rate Last Dose  . 0.9 %  sodium chloride infusion   Intravenous Continuous Kennedy Bucker, MD 100 mL/hr at 12/26/14 1954    . acetaminophen (TYLENOL) tablet 650 mg  650 mg Oral Q6H PRN Wyatt Haste, MD       Or  . acetaminophen (TYLENOL) suppository 650 mg  650 mg Rectal Q6H PRN Wyatt Haste, MD      . acetaminophen (TYLENOL) tablet 1,000 mg  1,000 mg Oral 4 times per day Kennedy Bucker, MD   1,000 mg at 12/27/14 0528  . albuterol (PROVENTIL) (2.5 MG/3ML) 0.083% nebulizer solution 3 mL  3 mL Inhalation Q6H PRN Wyatt Haste, MD      . alum & mag hydroxide-simeth (MAALOX/MYLANTA) 200-200-20 MG/5ML suspension 30 mL  30 mL Oral Q4H PRN Kennedy Bucker, MD      . azelastine (ASTELIN) 0.1 % nasal spray 1 spray  1 spray Each Nare BID Wyatt Haste, MD   1 spray at 12/27/14 [id number]  . bisacodyl (DULCOLAX) suppository 10 mg  10 mg Rectal Daily PRN Kennedy Bucker, MD      . diphenhydrAMINE (BENADRYL) capsule 25 mg  25 mg Oral Q4H PRN Wyatt Haste, MD      . divalproex (DEPAKOTE ER) 24 hr tablet 1,000 mg  1,000 mg Oral QHS Wyatt Haste, MD   1,000 mg  at 12/26/14 2202  . enoxaparin (LOVENOX) injection 40 mg  40 mg Subcutaneous Q24H Kennedy BuckerMichael Menz, MD   40 mg at 12/27/14 0834  . fluticasone (FLONASE) 50 MCG/ACT nasal spray 2 spray  2 spray Each Nare Daily PRN Wyatt Hasteavid K Hower, MD      . magnesium citrate solution 1 Bottle  1 Bottle Oral Once PRN Kennedy BuckerMichael Menz, MD      . magnesium hydroxide (MILK OF MAGNESIA) suspension 30 mL  30 mL Oral Daily PRN Kennedy BuckerMichael Menz, MD      . menthol-cetylpyridinium (CEPACOL) lozenge 3 mg  1 lozenge Oral PRN Kennedy BuckerMichael Menz, MD       Or  . phenol (CHLORASEPTIC) mouth spray 1 spray  1 spray Mouth/Throat PRN Kennedy BuckerMichael Menz, MD      . montelukast (SINGULAIR) tablet 10 mg  10 mg Oral QHS Wyatt Hasteavid K Hower, MD   10 mg at 12/26/14 2202  . morphine 2 MG/ML injection 2 mg  2 mg Intravenous Q4H PRN Wyatt Hasteavid K Hower, MD   2 mg at  12/26/14 1602  . ondansetron (ZOFRAN) tablet 4 mg  4 mg Oral Q6H PRN Wyatt Hasteavid K Hower, MD       Or  . ondansetron [facility](ZOFRAN) injection 4 mg  4 mg Intravenous Q6H PRN Wyatt Hasteavid K Hower, MD   4 mg at 12/26/14 1419  . oxybutynin (DITROPAN) tablet 5 mg  5 mg Oral BID Delfino LovettVipul Shah, MD   5 mg at 12/27/14 0831  . oxyCODONE (Oxy IR/ROXICODONE) immediate release tablet 5-10 mg  5-10 mg Oral Q4H PRN Altamese DillingVaibhavkumar Vachhani, MD   5 mg at 12/27/14 0819  . pantoprazole (PROTONIX) EC tablet 40 mg  40 mg Oral Daily Wyatt Hasteavid K Hower, MD   40 mg at 12/27/14 0830  . polyethylene glycol (MIRALAX / GLYCOLAX) packet 17 g  17 g Oral Daily PRN Wyatt Hasteavid K Hower, MD   17 g at 12/25/14 1001  . risperiDONE (RISPERDAL) tablet 4 mg  4 mg Oral QHS Wyatt Hasteavid K Hower, MD   4 mg at 12/26/14 2201  . senna-docusate (Senokot-S) tablet 1 tablet  1 tablet Oral BID Delfino LovettVipul Shah, MD   1 tablet at 12/27/14 0831  . sertraline (ZOLOFT) tablet 200 mg  200 mg Oral QHS Wyatt Hasteavid K Hower, MD   200 mg at 12/26/14 2202  . simvastatin (ZOCOR) tablet 40 mg  40 mg Oral QHS Wyatt Hasteavid K Hower, MD   40 mg at 12/26/14 2202  . traZODone (DESYREL) tablet 200 mg  200 mg Oral QHS Wyatt Hasteavid K Hower, MD   200 mg at 12/26/14 2202   Do not use this list as official medication orders. Please verify with discharge summary.  Discharge Medications:   Medication List    ASK your doctor about these medications        acetaminophen 650 MG CR tablet  Commonly known as:  TYLENOL  Take 650-1,300 mg by mouth every 8 (eight) hours as needed for pain.     acyclovir 400 MG tablet  Commonly known as:  ZOVIRAX  Take 400 mg by mouth 3 (three) times daily as needed (for flares).     albuterol 108 (90 BASE) MCG/ACT inhaler  Commonly known as:  PROVENTIL HFA;VENTOLIN HFA  Inhale 2 puffs into the lungs every 6 (six) hours as needed for wheezing or shortness of breath.     azelastine 0.1 % nasal spray  Commonly known as:  ASTELIN  Place 1 spray into both nostrils 2 (two) times daily.  diclofenac  50 MG EC tablet  Commonly known as:  VOLTAREN  Take 50 mg by mouth 2 (two) times daily as needed for mild pain.     divalproex 500 MG 24 hr tablet  Commonly known as:  DEPAKOTE ER  Take 1,000 mg by mouth at bedtime.     fluticasone 50 MCG/ACT nasal spray  Commonly known as:  FLONASE  Place 2 sprays into both nostrils daily as needed for rhinitis.     montelukast 10 MG tablet  Commonly known as:  SINGULAIR  Take 10 mg by mouth at bedtime.     omeprazole 40 MG capsule  Commonly known as:  PRILOSEC  Take 40 mg by mouth daily.     oxybutynin 5 MG 24 hr tablet  Commonly known as:  DITROPAN-XL  Take 5 mg by mouth at bedtime.     Oxycodone HCl 10 MG Tabs  Take 5-10 mg by mouth 2 (two) times daily as needed (for pain).     risperidone 4 MG tablet  Commonly known as:  RISPERDAL  Take 4 mg by mouth at bedtime.     sertraline 100 MG tablet  Commonly known as:  ZOLOFT  Take 200 mg by mouth at bedtime.     simvastatin 40 MG tablet  Commonly known as:  ZOCOR  Take 40 mg by mouth at bedtime.     traZODone 100 MG tablet  Commonly known as:  DESYREL  Take 200 mg by mouth at bedtime.        Relevant Imaging Results:  Relevant Lab Results:  Recent Labs    Additional Information SS: [id number]  Haig Prophet, LCSW

## 2014-12-27 NOTE — Discharge Summary (Signed)
Physician Discharge Summary  Patient ID: Kelly Foster MRN: [id number] DOB/AGE: [birth year] 64 y.o.  Admit date: 12/20/2014 Discharge date: 12/27/2014  Admission Diagnoses:  Left hip pain [M25.552] Arthritis of left hip [M19.90]   Discharge Diagnoses: Patient Active Problem List   Diagnosis Date Noted  . Intractable pain 12/20/2014  . Urinary frequency 07/29/2014  . Neuritis or radiculitis due to rupture of lumbar intervertebral disc 05/29/2014  . Spinal stenosis 11/29/2013  . Extremity numbness 11/29/2013  . Degenerative arthritis of hip 07/01/2013  . 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 Bipolar 1 disorder, mixed (HCC) 06/06/2013    Past Medical History  Diagnosis Date  . Allergy   . Anxiety   . Hypertension   . Bipolar 1 disorder (HCC)   . Manic depressive disorder (HCC)   . Hyperlipidemia   . DJD (degenerative joint disease)   . Hip pain   . Prediabetes   . Arthritis   . Ulnar neuropathy   . Urinary frequency   . Overweight   . Vaginal atrophy      Transfusion: none   Consultants (if any): Treatment Team:  Wyatt Haste, MD Deeann Saint, MD Kennedy Bucker, MD Ramonita Lab, MD  Discharged Condition: Improved  Hospital Course: Kelly Foster is an 64 y.o. female who was admitted 12/20/2014 with a diagnosis of Intractable pain secondary to severe left hip DJD and went to the operating room on 12/20/2014 - 12/26/2014 and underwent the above named procedures.    Surgeries: Procedure(s): TOTAL HIP ARTHROPLASTY ANTERIOR APPROACH on 12/20/2014 - 12/26/2014 Patient tolerated the surgery well. Taken to PACU where she was stabilized and then transferred to the orthopedic floor.  Started on Lovenox 40 q 12 hrs. Foot pumps applied bilaterally at 80 mm. Heels elevated on bed with rolled towels. No evidence of DVT. Negative Homan. Physical therapy started on day #1 for gait training and transfer. OT started day #1 for ADL and assisted  devices.  Patient's foley was d/c on day #1. Patient's IV and hemovac was d/c on day #2.  On post op day #2 patient was stable and ready for discharge to rehab.  Implants: Medacta AMIS 2 standard stem with 48 mm mPACT cup DM and S head, with liner for 48 mm cup   She was given perioperative antibiotics:  Anti-infectives    Start     Dose/Rate Route Frequency Ordered Stop   12/26/14 1900  ceFAZolin (ANCEF) IVPB 2 g/50 mL premix     2 g 100 mL/hr over 30 Minutes Intravenous 4 times per day 12/26/14 1701 12/27/14 0558   12/26/14 1300  ceFAZolin (ANCEF) IVPB 2 g/50 mL premix     2 g 100 mL/hr over 30 Minutes Intravenous To Surgery 12/25/14 1308 12/26/14 1325    .  She was given sequential compression devices, early ambulation, and lovenox for DVT prophylaxis.  She benefited maximally from the hospital stay and there were no complications.    Recent vital signs:  Filed Vitals:   12/27/14 0442 12/27/14 0712  BP: 149/67 131/73  Pulse: 94 98  Temp: 99 F (37.2 C) 99.1 F (37.3 C)  Resp: 18 18    Recent laboratory studies:  Lab Results  Component Value Date   HGB 11.7* 12/26/2014   HGB 10.5* 12/25/2014   HGB 11.8* 12/20/2014   Lab Results  Component Value Date   WBC 10.2 12/26/2014   PLT 610* 12/26/2014   Lab Results  Component Value Date  INR 1.03 12/25/2014   Lab Results  Component Value Date   NA 133* 12/26/2014   K 4.2 12/26/2014   CL 96* 12/26/2014   CO2 28 12/26/2014   BUN <5* 12/26/2014   CREATININE 0.51 12/26/2014   GLUCOSE 86 12/26/2014    Discharge Medications:     Medication List    TAKE these medications        acetaminophen 650 MG CR tablet  Commonly known as:  TYLENOL  Take 650-1,300 mg by mouth every 8 (eight) hours as needed for pain.     acyclovir 400 MG tablet  Commonly known as:  ZOVIRAX  Take 400 mg by mouth 3 (three) times daily as needed (for flares).     albuterol 108 (90 BASE) MCG/ACT inhaler  Commonly known as:  PROVENTIL  HFA;VENTOLIN HFA  Inhale 2 puffs into the lungs every 6 (six) hours as needed for wheezing or shortness of breath.     azelastine 0.1 % nasal spray  Commonly known as:  ASTELIN  Place 1 spray into both nostrils 2 (two) times daily.     diclofenac 50 MG EC tablet  Commonly known as:  VOLTAREN  Take 50 mg by mouth 2 (two) times daily as needed for mild pain.     divalproex 500 MG 24 hr tablet  Commonly known as:  DEPAKOTE ER  Take 1,000 mg by mouth at bedtime.     enoxaparin 40 MG/0.4ML injection  Commonly known as:  LOVENOX  Inject 0.4 mLs (40 mg total) into the skin daily.     fluticasone 50 MCG/ACT nasal spray  Commonly known as:  FLONASE  Place 2 sprays into both nostrils daily as needed for rhinitis.     montelukast 10 MG tablet  Commonly known as:  SINGULAIR  Take 10 mg by mouth at bedtime.     omeprazole 40 MG capsule  Commonly known as:  PRILOSEC  Take 40 mg by mouth daily.     oxybutynin 5 MG 24 hr tablet  Commonly known as:  DITROPAN-XL  Take 5 mg by mouth at bedtime.     Oxycodone HCl 10 MG Tabs  Take 5-10 mg by mouth 2 (two) times daily as needed (for pain).     oxyCODONE 5 MG immediate release tablet  Commonly known as:  Oxy IR/ROXICODONE  Take 1-2 tablets (5-10 mg total) by mouth every 4 (four) hours as needed for moderate pain.     risperidone 4 MG tablet  Commonly known as:  RISPERDAL  Take 4 mg by mouth at bedtime.     sertraline 100 MG tablet  Commonly known as:  ZOLOFT  Take 200 mg by mouth at bedtime.     simvastatin 40 MG tablet  Commonly known as:  ZOCOR  Take 40 mg by mouth at bedtime.     traZODone 100 MG tablet  Commonly known as:  DESYREL  Take 200 mg by mouth at bedtime.        Diagnostic Studies: US Venous Img Lower Bilateral  12/21/2014  CLINICAL DATA:  Bilateral lower extremity edema for 1 month. EXAM: BILATERAL LOWER EXTREMITY VENOUS DOPPLER ULTRASOUND TECHNIQUE: Gray-scale sonography with graded compression, as well as color  Doppler and duplex ultrasound were performed to evaluate the lower extremity deep venous systems from the level of the common femoral vein and including the common femoral, femoral, profunda femoral, popliteal and calf veins including the posterior tibial, peroneal and gastrocnemius veins when visible. The superficial great saphenous  vein was also interrogated. Spectral Doppler was utilized to evaluate flow at rest and with distal augmentation maneuvers in the common femoral, femoral and popliteal veins. COMPARISON:  None. FINDINGS: RIGHT LOWER EXTREMITY Common Femoral Vein: No evidence of thrombus. Normal compressibility, respiratory phasicity and response to augmentation. Saphenofemoral Junction: No evidence of thrombus. Normal compressibility and flow on color Doppler imaging. Profunda Femoral Vein: No evidence of thrombus. Normal compressibility and flow on color Doppler imaging. Femoral Vein: No evidence of thrombus. Normal compressibility, respiratory phasicity and response to augmentation. Popliteal Vein: No evidence of thrombus. Normal compressibility, respiratory phasicity and response to augmentation. Calf Veins: No evidence of thrombus. Normal compressibility and flow on color Doppler imaging. Superficial Great Saphenous Vein: No evidence of thrombus. Normal compressibility and flow on color Doppler imaging. Venous Reflux:  None. Other Findings: No evidence of superficial thrombophlebitis or abnormal fluid collection. LEFT LOWER EXTREMITY Common Femoral Vein: No evidence of thrombus. Normal compressibility, respiratory phasicity and response to augmentation. Saphenofemoral Junction: No evidence of thrombus. Normal compressibility and flow on color Doppler imaging. Profunda Femoral Vein: No evidence of thrombus. Normal compressibility and flow on color Doppler imaging. Femoral Vein: No evidence of thrombus. Normal compressibility, respiratory phasicity and response to augmentation. Popliteal Vein: No  evidence of thrombus. Normal compressibility, respiratory phasicity and response to augmentation. Calf Veins: No evidence of thrombus. Normal compressibility and flow on color Doppler imaging. Superficial Great Saphenous Vein: No evidence of thrombus. Normal compressibility and flow on color Doppler imaging. Venous Reflux:  None. Other Findings: No evidence of superficial thrombophlebitis or abnormal fluid collection. IMPRESSION: No evidence of bilateral lower extremity deep venous thrombosis. Electronically Signed   By: Irish LackGlenn  Yamagata M.D.   On: 12/21/2014 17:21   Dg Hip Operative Unilat W Or W/o Pelvis Left  12/26/2014  CLINICAL DATA:  Left hip replacement EXAM: OPERATIVE left HIP (WITH PELVIS IF PERFORMED) 3 VIEWS TECHNIQUE: Fluoroscopic spot image(s) were submitted for interpretation post-operatively. COMPARISON:  12/20/2014 FINDINGS: Three views of the left hip submitted. There is left hip prosthesis with anatomic alignment. IMPRESSION: Left hip prosthesis with anatomic alignment. Fluoroscopy time 30 seconds.  Please see the operative report Electronically Signed   By: Natasha MeadLiviu  Pop M.D.   On: 12/26/2014 14:44   Dg Hip Unilat W Or W/o Pelvis 2-3 Views Left  12/26/2014  CLINICAL DATA:  Status post left hip arthroplasty.  Postop imaging. EXAM: DG HIP (WITH OR WITHOUT PELVIS) 2-3V LEFT COMPARISON:  12/20/2014 FINDINGS: Two views show the new left hip total arthroplasty to be well seated and aligned. There is no acute fracture or evidence of an operative complication. IMPRESSION: Well aligned left hip prosthesis. Electronically Signed   By: Amie Portlandavid  Ormond M.D.   On: 12/26/2014 15:28   Dg Hip Unilat With Pelvis 2-3 Views Left  12/20/2014  CLINICAL DATA:  64 year old female with left hip pain, no recent injury. Scheduled for hip replacement soon. Subsequent encounter. EXAM: DG HIP (WITH OR WITHOUT PELVIS) 2-3V LEFT COMPARISON:  None. FINDINGS: Very severe left hip joint space loss with chronic severe  remodeling of the left femoral head. Subchondral sclerosis and osteophytosis. Proximal left femur is intact. Moderate to severe hip joint space loss on the right with osteophytosis and subchondral sclerosis. No acute fracture of the pelvis. Calcified iliac artery atherosclerosis. Pelvic phleboliths. 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 No acute osseous abnormality identified. Electronically Signed   By: Odessa FlemingH  Hall M.D.   On: 12/20/2014  16:37    Disposition: Rehab       Follow-up Information    Follow up with MENZ,MICHAEL, MD In 2 weeks.   Specialty:  Orthopedic Surgery   Why:  For staple removal and skin check, As needed if symptoms have not improved   Contact information:   [address] [facility] [place] [address] [phone]        Signed: Patience Musca 12/27/2014, 12:00 PM

## 2014-12-27 NOTE — Progress Notes (Signed)
Physical Therapy Treatment Patient Details Name: Kelly Foster MRN: [id number] DOB: 07/06/1950 Today's Date: 12/27/2014    History of Present Illness Pt is a 64 yo female who was admitted to the hospital due to intractable L hip pain. Pt is unable to take care of herself or ambulate secondary to pain. [facility] Pt is now s/p L [facility].    PT Comments    Pt able to take a few steps recliner to bed with 1 assist and RW but limited distance d/t c/o L hip pain.  Pt requiring significant cueing to initiate movement for LE ex's B in bed.  Plan for discharge to STR tomorrow.  Follow Up Recommendations  SNF     Equipment Recommendations  Rolling walker with 5" wheels    Recommendations for Other Services       Precautions / Restrictions Precautions Precautions: Fall Precaution Comments: Direct Anterior: no hip precautions per orders Restrictions Weight Bearing Restrictions: Yes LLE Weight Bearing: Weight bearing as tolerated    Mobility  Bed Mobility Overal bed mobility: Needs Assistance Bed Mobility: Sit to Supine     Supine to sit: Max assist     General bed mobility comments: assist for B LE's and trunk; vc's to assist required (use of trapeze to scoot up in bed with max assist x1)  Transfers Overall transfer level: Needs assistance Equipment used: Rolling walker (2 wheeled) Transfers: Sit to/from Stand Sit to Stand: Max assist         General transfer comment: vc's for hand and feet placement required; extra time to stand upright required  Ambulation/Gait Ambulation/Gait assistance: Mod assist Ambulation Distance (Feet): 3 Feet (recliner to bed) Assistive device: Rolling walker (2 wheeled) Gait Pattern/deviations: Step-to pattern;Antalgic;Decreased step length - left;Decreased stance time - left Gait velocity: decreased   General Gait Details: vc's required for gait technique and walker  use   Stairs            Wheelchair Mobility    Modified Rankin (Stroke Patients Only)       Balance Overall balance assessment: Needs assistance Sitting-balance support: Bilateral upper extremity supported;Feet supported Sitting balance-Leahy Scale: Fair     Standing balance support: Bilateral upper extremity supported (on RW) Standing balance-Leahy Scale: Fair                      Cognition Arousal/Alertness: Awake/alert Behavior During Therapy: Flat affect Overall Cognitive Status: Within Functional Limits for tasks assessed                      Exercises   Performed semi-supine B LE therapeutic exercise x 10 reps:  Ankle pumps (AROM B LE's); quad sets x3 second holds (AROM B LE's); SAQ's (AAROM R; AAROM L); heelslides (AAROM R; AAROM L), hip abd/adduction (AAROM R; AAROM L).  Pt required vc's and tactile cues for correct technique with exercises.     General Comments General comments (skin integrity, edema, etc.): dressing and hemovac in place  Nursing cleared pt for participation in physical therapy.  Pt agreeable to PT session.       Pertinent Vitals/Pain Pain Assessment: 0-10 Pain Score: 9  Pain Location: L hip Pain Descriptors / Indicators: Constant;Aching;Sore;Tender Pain Intervention(s): Limited activity within patient's tolerance;Monitored during session;Repositioned  Vitals stable and WFL throughout treatment session.    Home Living Family/patient expects to be discharged to:: Skilled nursing facility  Living Arrangements: Alone     Home Access: Stairs to enter Entrance Stairs-Rails: Right;Left;Can reach both Home Layout: One level   Additional Comments: Pt lives alone with no available assistance at discharge.     Prior Function Level of Independence: Independent with assistive device(s)      Comments: Pt able to ambulate community distances with rollator walker or cane. Independent with ADLs.   PT Goals (current goals can  now be found in the care plan section) Acute Rehab PT Goals Patient Stated Goal: Get some rehab PT Goal Formulation: With patient Time For Goal Achievement: 01/10/15 Potential to Achieve Goals: Good Progress towards PT goals: Progressing toward goals    Frequency  BID    PT Plan Current plan remains appropriate    Co-evaluation             End of Session Equipment Utilized During Treatment: Gait belt Activity Tolerance: Patient limited by pain Patient left: in bed;with call bell/phone within reach;with bed alarm set;with SCD's reapplied (B heels elevated via towel rolls)     Time: 4782-9562 PT Time Calculation (min) (ACUTE ONLY): 24 min  Charges:  $Therapeutic Exercise: 8-22 mins $Therapeutic Activity: 8-22 mins                    G CodesHendricks Foster 12/29/2014, 3:50 PM Kelly Foster, PT [phone]

## 2014-12-27 NOTE — Evaluation (Signed)
Occupational Therapy Evaluation Patient Details Name: Kelly Foster MRN: [id number] DOB: 1950/04/22 Today's Date: 12/27/2014    History of Present Illness Pt is a 64 yo female who was admitted to the hospital due to intractable L hip pain. Pt is unable to take care of herself or ambulate secondary to pain. She has THA scheduled one month from now at [facility]. Pt is now s/p L ant THR at [facility] on 12/26/14. (anterior approach)    Clinical Impression   She lives alone and had been independent with activities of daily living and functional mobility including driving. She now needs assist and would benefit from Occupational Therapy for ADL/functioal mobility training.    Follow Up Recommendations  SNF    Equipment Recommendations       Recommendations for Other Services       Precautions / Restrictions Precautions Precautions: Fall Precaution Comments: Direct Anterior: no hip precautions per orders Restrictions Weight Bearing Restrictions: Yes LLE Weight Bearing: Weight bearing as tolerated      Mobility Bed Mobility       Transfers             Balance                                   ADL                                         General ADL Comments: Had been independent, patient did practice with hip kit, techniques for lower body dressing. Used hand over hand assist to illustrate techniques and verbal cues for techniques and safety.     Vision     Perception     Praxis      Pertinent Vitals/Pain Pain Assessment:  (Patient did not rate, stated "It's not good)) Pain Score: 10-Worst pain ever Pain Location: L hip Pain Descriptors / Indicators: Constant;Aching;Sore;Tender Pain Intervention(s): Limited activity within patient's tolerance;Monitored during session;Premedicated before session;Repositioned     Hand Dominance     Extremity/Trunk Assessment Upper Extremity Assessment Upper Extremity Assessment: Overall WFL for  tasks assessed   Lower Extremity Assessment Lower Extremity Assessment: Defer to PT evaluation RLE Deficits / Details: strength and ROM WFL LLE Deficits / Details: hip flexion 3-/5; knee flexion/extension at least 3+/5; DF at least 3+/5 LLE: Unable to fully assess due to pain (per Physical Therapy)       Communication Communication Communication: No difficulties   Cognition Arousal/Alertness: Awake/alert Behavior During Therapy: Flat affect Overall Cognitive Status: Within Functional Limits for tasks assessed                     General Comments       Exercises       Shoulder Instructions      Home Living Family/patient expects to be discharged to:: Skilled nursing facility Living Arrangements: Alone     Home Access: Stairs to enter Entrance Stairs-Number of Steps: 2 Entrance Stairs-Rails: Right;Left;Can reach both Home Layout: One level                   Additional Comments: Pt lives alone with no available assistance at discharge.       Prior Functioning/Environment Level of Independence: Independent with assistive device(s)        Comments: Pt able to ambulate  community distances with rollator walker or cane. Independent with ADLs.    OT Diagnosis: Acute pain   OT Problem List: Decreased strength;Decreased range of motion;Decreased activity tolerance;Impaired balance (sitting and/or standing);Decreased safety awareness;Pain   OT Treatment/Interventions: Self-care/ADL training    OT Goals(Current goals can be found in the care plan section) Acute Rehab OT Goals Patient Stated Goal: Get some rehab OT Goal Formulation: With patient Time For Goal Achievement: 01/10/15 Potential to Achieve Goals: Good  OT Frequency: Min 1X/week   Barriers to D/C:            Co-evaluation              End of Session Equipment Utilized During Treatment:  (hip kit)  Activity Tolerance:   Patient left:     Time: 7915-0569 OT Time Calculation  (min): 15 min Charges:  OT General Charges $OT Visit: 1 Procedure OT Evaluation $Initial OT Evaluation Tier I: 1 Procedure G-Codes:    Myrene Galas, MS/OTR/L  12/27/2014, 1:41 PM

## 2014-12-27 NOTE — Progress Notes (Signed)
   Subjective: 1 Day Post-Op Procedure(s) (LRB): TOTAL HIP ARTHROPLASTY ANTERIOR APPROACH (Left) Patient reports pain as mild.   Patient is well, and has had no acute complaints or problems We will start therapy today.  Plan is to go Rehab after hospital stay.  Objective: Vital signs in last 24 hours: Temp:  [97.6 F (36.4 C)-100.4 F (38 C)] 99.1 F (37.3 C) (11/23 0712) Pulse Rate:  [88-124] 98 (11/23 0712) Resp:  [13-20] 18 (11/23 0712) BP: (101-159)/(44-77) 131/73 mmHg (11/23 0712) SpO2:  [93 %-100 %] 93 % (11/23 0712) Weight:  [90.719 kg (200 lb)-93 kg (205 lb 0.4 oz)] 93 kg (205 lb 0.4 oz) (11/23 0500)  Intake/Output from previous day: 11/22 0701 - 11/23 0700 In: 3106.7 [P.O.:600; I.V.:2356.7; IV Piggyback:150] Out: 1285 [Urine:725; Drains:80; Blood:450] Intake/Output this shift:     Recent Labs  12/25/14 0353 12/26/14 0453  HGB 10.5* 11.7*    Recent Labs  12/25/14 0353 12/26/14 0453  WBC 8.0 10.2  RBC 3.63* 4.18  HCT 30.8* 36.1  PLT 616* 610*    Recent Labs  12/26/14 0453  NA 133*  K 4.2  CL 96*  CO2 28  BUN <5*  CREATININE 0.51  GLUCOSE 86  CALCIUM 9.4    Recent Labs  12/25/14 0353  INR 1.03    EXAM General - Patient is Alert, Appropriate and Oriented Extremity - Neurovascular intact Sensation intact distally Intact pulses distally Dorsiflexion/Plantar flexion intact Dressing - dressing C/D/I, no drainage and hemovac intact Motor Function - intact, moving foot and toes well on exam.   Past Medical History  Diagnosis Date  . Allergy   . Anxiety   . Hypertension   . Bipolar 1 disorder (HCC)   . Manic depressive disorder (HCC)   . Hyperlipidemia   . DJD (degenerative joint disease)   . Hip pain   . Prediabetes   . Arthritis   . Ulnar neuropathy   . Urinary frequency   . Overweight   . Vaginal atrophy     Assessment/Plan:   1 Day Post-Op Procedure(s) (LRB): TOTAL HIP ARTHROPLASTY ANTERIOR APPROACH (Left) Principal  Problem:   Intractable pain  Estimated body mass index is 32.1 kg/(m^2) as calculated from the following:   Height as of this encounter: 5\' 7"  (1.702 m).   Weight as of this encounter: 93 kg (205 lb 0.4 oz). Advance diet Up with therapy  Needs BM Recheck labs in the am Dc hemovac in the am  DVT Prophylaxis - Lovenox, Foot Pumps and TED hose Weight-Bearing as tolerated to left leg D/C O2 and Pulse OX and try on Room Air  T. Cranston Neighborhris Gaines, PA-C [facility] 12/27/2014, 8:00 AM

## 2014-12-27 NOTE — Progress Notes (Signed)
Patient is POD 1 left hip. Pain controlled with scheduled and prn medication. Patient sleeping between care. IV fluids infusing. Patient denies N/V. Patient demonstrates correct use of incentive spirometer.

## 2014-12-27 NOTE — Discharge Instructions (Signed)

## 2014-12-28 LAB — CBC
HEMATOCRIT: 28.2 % — AB (ref 35.0–47.0)
HEMOGLOBIN: 9.2 g/dL — AB (ref 12.0–16.0)
MCH: 27.5 pg (ref 26.0–34.0)
MCHC: 32.6 g/dL (ref 32.0–36.0)
MCV: 84.4 fL (ref 80.0–100.0)
Platelets: 698 10*3/uL — ABNORMAL HIGH (ref 150–440)
RBC: 3.34 MIL/uL — ABNORMAL LOW (ref 3.80–5.20)
RDW: 15.1 % — AB (ref 11.5–14.5)
WBC: 12.1 10*3/uL — ABNORMAL HIGH (ref 3.6–11.0)

## 2014-12-28 LAB — BASIC METABOLIC PANEL
Anion gap: 8 (ref 5–15)
CALCIUM: 8.5 mg/dL — AB (ref 8.9–10.3)
CHLORIDE: 97 mmol/L — AB (ref 101–111)
CO2: 26 mmol/L (ref 22–32)
CREATININE: 0.37 mg/dL — AB (ref 0.44–1.00)
GFR calc non Af Amer: >60 mL/min (ref >60)
GLUCOSE: 102 mg/dL — AB (ref 65–99)
Potassium: 3.8 mmol/L (ref 3.5–5.1)
Sodium: 131 mmol/L — ABNORMAL LOW (ref 135–145)

## 2014-12-28 NOTE — Progress Notes (Signed)
Patient ID: Kelly Foster, female   DOB: 02/11/1950, 64 y.o.   MRN: [id number] [facility] [place] [facility]   PATIENT NAME: Kelly Foster    MR#:  [id number]  DATE OF BIRTH:  05/30/1950  SUBJECTIVE:  Doing ok. Working with PT Chronic left UE weakness for 1 year per pt  REVIEW OF SYSTEMS:   Review of Systems  Constitutional: Negative for fever, chills and weight loss.  HENT: Negative for ear discharge, ear pain and nosebleeds.   Eyes: Negative for blurred vision, pain and discharge.  Respiratory: Negative for sputum production, shortness of breath, wheezing and stridor.   Cardiovascular: Negative for chest pain, palpitations, orthopnea and PND.  Gastrointestinal: Negative for nausea, vomiting, abdominal pain and diarrhea.  Genitourinary: Negative for urgency and frequency.  Musculoskeletal: Positive for joint pain. Negative for back pain.  Neurological: Negative for sensory change, speech change, focal weakness and weakness.  Psychiatric/Behavioral: Negative for depression and hallucinations. The patient is not nervous/anxious.   All other systems reviewed and are negative.  Tolerating Diet:yes Tolerating PT: rec snf  DRUG ALLERGIES:   Allergies  Allergen Reactions  . Codeine Itching  . Tramadol Itching    VITALS:  Blood pressure 141/72, pulse 113, temperature 97.8 F (36.6 C), temperature source Oral, resp. rate 17, height 5\' 7"  (1.702 m), weight 93 kg (205 lb 0.4 oz), SpO2 93 %.  PHYSICAL EXAMINATION:   Physical Exam  GENERAL:  64 y.o.-year-old patient lying in the bed with no acute distress.  EYES: Pupils equal, round, reactive to light and accommodation. No scleral icterus. Extraocular muscles intact.  HEENT: Head atraumatic, normocephalic. Oropharynx and nasopharynx clear.  NECK:  Supple, no jugular venous distention. No thyroid enlargement, no tenderness.  LUNGS: Normal breath sounds bilaterally, no wheezing, rales, rhonchi. No use  of accessory muscles of respiration.  CARDIOVASCULAR: S1, S2 normal. No murmurs, rubs, or gallops.  ABDOMEN: Soft, nontender, nondistended. Bowel sounds present. No organomegaly or mass.  EXTREMITIES: No cyanosis, clubbing or edema b/l.    NEUROLOGIC: Cranial nerves II through XII are intact. Chronic left UE weakness PSYCHIATRIC: The patient is alert and oriented x 3.  SKIN: No obvious rash, lesion, or ulcer.    LABORATORY PANEL:   CBC  Recent Labs Lab 12/28/14 0538  WBC 12.1*  HGB 9.2*  HCT 28.2*  PLT 698*    Chemistries   Recent Labs Lab 12/28/14 0538  NA 131*  K 3.8  CL 97*  CO2 26  GLUCOSE 102*  BUN <5*  CREATININE 0.37*  CALCIUM 8.5*    Cardiac Enzymes No results for input(s): TROPONINI in the last 168 hours.  RADIOLOGY:  Dg Hip Operative Unilat W Or W/o Pelvis Left  12/26/2014  CLINICAL DATA:  Left hip replacement EXAM: OPERATIVE left HIP (WITH PELVIS IF PERFORMED) 3 VIEWS TECHNIQUE: Fluoroscopic spot image(s) were submitted for interpretation post-operatively. COMPARISON:  12/20/2014 FINDINGS: Three views of the left hip submitted. There is left hip prosthesis with anatomic alignment. IMPRESSION: Left hip prosthesis with anatomic alignment. Fluoroscopy time 30 seconds.  Please see the operative report Electronically Signed   By: Natasha MeadLiviu  Pop M.D.   On: 12/26/2014 14:44   Dg Hip Unilat W Or W/o Pelvis 2-3 Views Left  12/26/2014  CLINICAL DATA:  Status post left hip arthroplasty.  Postop imaging. EXAM: DG HIP (WITH OR WITHOUT PELVIS) 2-3V LEFT COMPARISON:  12/20/2014 FINDINGS: Two views show the new left hip total arthroplasty to be well seated and aligned.  There is no acute fracture or evidence of an operative complication. IMPRESSION: Well aligned left hip prosthesis. Electronically Signed   By: Amie Portland M.D.   On: 12/26/2014 15:28   ASSESSMENT AND PLAN:   64 year old African-American female history of osteoarthritis who is presenting with worsening hip  pain.  1. Intractable pain, left hip/degenerative joint disease:  POD#2 Doing well working with PT  2. Hyponatremia: Resolved with ivf   3. GERD without esophagitis: PPI therapy  4. Hyperlipidemia unspecified: Statin therapy  5. Venous thrombus embolism prophylactic:lovenox Overall improving. D/c to rehab today. Vitals stable  Case discussed with Care Management/Social Worker. Management plans discussed with the patient, family and they are in agreement.  CODE STATUS: full  DVT Prophylaxis: lovenox  TOTAL TIME TAKING CARE OF THIS PATIENT: 25 minutes.  >50% time spent on counselling and coordination of care  Kennis Wissmann M.D on 12/28/2014 at 7:21 AM  Between 7am to 6pm - Pager - [phone]  After 6pm go to [email or web address] - password [email or web address] [facility]  [facility]  Office  [phone]  CC: Primary care physician; Hyman Hopes, MD

## 2014-12-28 NOTE — Progress Notes (Signed)
[facility]. [place] [facility] to transport pt. VS stable.  PRN pain medication given prior to D/C, intake nurse notified.

## 2014-12-28 NOTE — Progress Notes (Signed)
Pt states she had flu vaccine at Dr's office this year, vaccine not indicated at this time.

## 2014-12-28 NOTE — Progress Notes (Signed)
Physical Therapy Treatment Patient Details Name: Kelly Foster MRN: [id number] DOB: 09/20/1950 Today's Date: 12/28/2014    History of Present Illness Pt is a 64 yo female who was admitted to the hospital due to intractable L hip pain. Pt is unable to take care of herself or ambulate secondary to pain. She has [facility]. Pt is now s/p L [facility] on 12/[facility]/14.    PT Comments    Deferred OOB mobility d/t pt c/o 10/10 L hip pain and pt reporting not having pain medication yet.  Pt needing a lot of cueing to participate and initiate movement for B LE ex's in bed.  Plan for discharge to STR today.  Follow Up Recommendations  SNF     Equipment Recommendations  Rolling walker with 5" wheels    Recommendations for Other Services       Precautions / Restrictions Precautions Precautions: Fall Precaution Comments: Direct Anterior: no hip precautions per orders Restrictions Weight Bearing Restrictions: Yes LLE Weight Bearing: Weight bearing as tolerated    Mobility  Bed Mobility               General bed mobility comments: Deferred OOB mobility d/t pt c/o 10/10 L hip pain and pt reporting not having pain medication yet  Transfers                    Ambulation/Gait                 Stairs            Wheelchair Mobility    Modified Rankin (Stroke Patients Only)       Balance                                    Cognition Arousal/Alertness: Awake/alert Behavior During Therapy: Flat affect Overall Cognitive Status: Within Functional Limits for tasks assessed                      Exercises   Performed semi-supine B LE therapeutic exercise x 10 reps:  Ankle pumps (AROM B LE's); quad sets x3 second holds (AROM B LE's); SAQ's (AROM R; AAROM L); heelslides (AAROM R; AAROM L), hip abd/adduction (AAROM R; AAROM L).  Pt required vc's and tactile cues and extra time for correct technique with  exercises.     General Comments   Nursing cleared pt for participation in physical therapy.  Pt agreeable to PT session.       Pertinent Vitals/Pain Pain Assessment: 0-10 Pain Score: 10-Worst pain ever Pain Location: L hip Pain Descriptors / Indicators: Constant;Aching;Sore;Tender Pain Intervention(s): Limited activity within patient's tolerance;Monitored during session;Repositioned;Patient requesting pain meds-RN notified  Vitals stable and WFL throughout treatment session.    Home Living                      Prior Function            PT Goals (current goals can now be found in the care plan section) Acute Rehab PT Goals Patient Stated Goal: Get some rehab PT Goal Formulation: With patient Time For Goal Achievement: 01/10/15 Potential to Achieve Goals: Good Progress towards PT goals: Progressing toward goals (with LE strengthening)    Frequency  BID    PT Plan Current plan remains appropriate    Co-evaluation  End of Session   Activity Tolerance: Patient limited by pain Patient left: in bed;with call bell/phone within reach;with bed alarm set;with SCD's reapplied (B heels elevated via pillow)     Time: 1610-9604 PT Time Calculation (min) (ACUTE ONLY): 15 min  Charges:  $Therapeutic Exercise: 8-22 mins                    G CodesHendricks Limes 2015/01/01, 9:26 AM Hendricks Limes, PT [phone]

## 2014-12-28 NOTE — Progress Notes (Signed)
   Subjective: 2 Days Post-Op Procedure(s) (LRB): TOTAL HIP ARTHROPLASTY ANTERIOR APPROACH (Left) Patient reports pain as mild.   Patient is well, and has had no acute complaints or problems We will continue therapy today Plan is to go Rehab today.  Objective: Vital signs in last 24 hours: Temp:  [97.8 F (36.6 C)-98.7 F (37.1 C)] 98.7 F (37.1 C) (11/24 0812) Pulse Rate:  [89-113] 111 (11/24 0812) Resp:  [17-19] 18 (11/24 0812) BP: (127-141)/(64-75) 129/68 mmHg (11/24 0812) SpO2:  [92 %-98 %] 95 % (11/24 0812)  Intake/Output from previous day: 11/23 0701 - 11/24 0700 In: 0  Out: 20 [Drains:20] Intake/Output this shift:     Recent Labs  12/26/14 0453 12/28/14 0538  HGB 11.7* 9.2*    Recent Labs  12/26/14 0453 12/28/14 0538  WBC 10.2 12.1*  RBC 4.18 3.34*  HCT 36.1 28.2*  PLT 610* 698*    Recent Labs  12/26/14 0453 12/28/14 0538  NA 133* 131*  K 4.2 3.8  CL 96* 97*  CO2 28 26  BUN <5* <5*  CREATININE 0.51 0.37*  GLUCOSE 86 102*  CALCIUM 9.4 8.5*   No results for input(s): LABPT, INR in the last 72 hours.  EXAM General - Patient is Alert, Appropriate and Oriented Extremity - Neurovascular intact Sensation intact distally Intact pulses distally Dorsiflexion/Plantar flexion intact Dressing - dressing C/D/I and hemovac removed. honeycomb applied Motor Function - intact, moving foot and toes well on exam.   Past Medical History  Diagnosis Date  . Allergy   . Anxiety   . Hypertension   . Bipolar 1 disorder (HCC)   . Manic depressive disorder (HCC)   . Hyperlipidemia   . DJD (degenerative joint disease)   . Hip pain   . Prediabetes   . Arthritis   . Ulnar neuropathy   . Urinary frequency   . Overweight   . Vaginal atrophy     Assessment/Plan:   2 Days Post-Op Procedure(s) (LRB): TOTAL HIP ARTHROPLASTY ANTERIOR APPROACH (Left) Principal Problem:   Intractable pain  Estimated body mass index is 32.1 kg/(m^2) as calculated from the  following:   Height as of this encounter: 5\' 7"  (1.702 m).   Weight as of this encounter: 93 kg (205 lb 0.4 oz). Advance diet Up with therapy  Discharge to rehab today Follow up with [facility] in 2 weeks  DVT Prophylaxis - Lovenox, Foot Pumps and TED hose Weight-Bearing as tolerated to left leg D/C O2 and Pulse OX and try on Room Air  T. Cranston Neighborhris Gaines, PA-C [facility] 12/28/2014, 8:57 AM

## 2015-01-02 ENCOUNTER — Ambulatory Visit: Payer: Medicare Other | Admitting: Urology

## 2015-01-04 ENCOUNTER — Encounter
Admission: RE | Admit: 2015-01-04 | Discharge: 2015-01-04 | Disposition: A | Discharge status: Home / Self Care | Payer: Medicare Other | Source: Ambulatory Visit | Attending: Internal Medicine | Admitting: Internal Medicine

## 2015-01-04 ENCOUNTER — Encounter
Admission: RE | Admit: 2015-01-04 | Discharge: 2015-01-04 | Disposition: A | Discharge status: Home / Self Care | Payer: Medicare Other | Source: Skilled Nursing Facility | Attending: Internal Medicine | Admitting: Internal Medicine

## 2015-01-04 DIAGNOSIS — D649 Anemia, unspecified: Secondary | ICD-10-CM | POA: Diagnosis present

## 2015-01-04 DIAGNOSIS — E871 Hypo-osmolality and hyponatremia: Secondary | ICD-10-CM | POA: Insufficient documentation

## 2015-01-04 HISTORY — PX: JOINT REPLACEMENT: SHX530

## 2015-01-04 LAB — CBC WITH DIFFERENTIAL/PLATELET
Basophils Absolute: 0 10*3/uL (ref 0–0.1)
Basophils Relative: 1 %
EOS ABS: 0 10*3/uL (ref 0–0.7)
EOS PCT: 1 %
HCT: 29.1 % — ABNORMAL LOW (ref 35.0–47.0)
Hemoglobin: 9.4 g/dL — ABNORMAL LOW (ref 12.0–16.0)
LYMPHS ABS: 1.5 10*3/uL (ref 1.0–3.6)
Lymphocytes Relative: 24 %
MCH: 27.5 pg (ref 26.0–34.0)
MCHC: 32.3 g/dL (ref 32.0–36.0)
MCV: 85.3 fL (ref 80.0–100.0)
MONOS PCT: 13 %
Monocytes Absolute: 0.8 10*3/uL (ref 0.2–0.9)
Neutro Abs: 3.7 10*3/uL (ref 1.4–6.5)
Neutrophils Relative %: 61 %
PLATELETS: 605 10*3/uL — AB (ref 150–440)
RBC: 3.42 MIL/uL — AB (ref 3.80–5.20)
RDW: 15.4 % — ABNORMAL HIGH (ref 11.5–14.5)
WBC: 6.1 10*3/uL (ref 3.6–11.0)

## 2015-01-04 LAB — COMPREHENSIVE METABOLIC PANEL
ALT: 11 U/L — ABNORMAL LOW (ref 14–54)
ANION GAP: 8 (ref 5–15)
AST: 13 U/L — ABNORMAL LOW (ref 15–41)
Albumin: 2.4 g/dL — ABNORMAL LOW (ref 3.5–5.0)
Alkaline Phosphatase: 64 U/L (ref 38–126)
BUN: <5 mg/dL — ABNORMAL LOW (ref 6–20)
CHLORIDE: 98 mmol/L — AB (ref 101–111)
CO2: 27 mmol/L (ref 22–32)
Calcium: 9.2 mg/dL (ref 8.9–10.3)
Creatinine, Ser: 0.57 mg/dL (ref 0.44–1.00)
GFR calc non Af Amer: >60 mL/min (ref >60)
Glucose, Bld: 86 mg/dL (ref 65–99)
Potassium: 4.3 mmol/L (ref 3.5–5.1)
SODIUM: 133 mmol/L — AB (ref 135–145)
Total Bilirubin: <0.1 mg/dL — ABNORMAL LOW (ref 0.3–1.2)
Total Protein: 5.8 g/dL — ABNORMAL LOW (ref 6.5–8.1)

## 2015-01-04 LAB — SURGICAL PATHOLOGY

## 2015-03-05 ENCOUNTER — Telehealth: Payer: Self-pay

## 2015-03-05 NOTE — Telephone Encounter (Signed)
Dr. Metta Clines                This patient has not been here since 12-07-2014. Please advise on what to tell her. - Thank you.

## 2015-03-05 NOTE — Telephone Encounter (Signed)
Pt wants Dr. Metta Clines to send script to pharmacy so she can get her meds pt says she does not have a ride to come to a doctors appointment

## 2015-03-05 NOTE — Telephone Encounter (Signed)
Patient notified per voicemail that she must come for appt in order to get prescriptions.

## 2015-03-05 NOTE — Telephone Encounter (Signed)
Nurses and Secretaries Patient needs evaluation for consider prescribing medications

## 2015-03-27 ENCOUNTER — Telehealth: Payer: Self-pay | Admitting: Radiology

## 2015-03-27 DIAGNOSIS — N3281 Overactive bladder: Secondary | ICD-10-CM

## 2015-03-27 NOTE — Telephone Encounter (Signed)
Pt requests refill of oxybutinine. Please return pt's call at [phone].

## 2015-03-27 NOTE — Telephone Encounter (Signed)
Okay to refill the oxybutynin ER 5 mg daily.

## 2015-03-30 MED ORDER — OXYBUTYNIN CHLORIDE ER 5 MG PO TB24: 5.0000 mg | ORAL_TABLET | Freq: Every day | ORAL | Status: DC

## 2015-03-30 NOTE — Telephone Encounter (Signed)
Medication was sent to pharmacy.

## 2015-04-05 ENCOUNTER — Ambulatory Visit: Payer: Medicare HMO | Attending: Pain Medicine | Admitting: Pain Medicine

## 2015-04-05 ENCOUNTER — Encounter: Payer: Self-pay | Admitting: Pain Medicine

## 2015-04-05 VITALS — BP 135/64 | HR 75 | Temp 97.8°F | Resp 16 | Ht 66.5 in | Wt 197.0 lb

## 2015-04-05 DIAGNOSIS — M79606 Pain in leg, unspecified: Secondary | ICD-10-CM | POA: Diagnosis present

## 2015-04-05 DIAGNOSIS — M792 Neuralgia and neuritis, unspecified: Secondary | ICD-10-CM

## 2015-04-05 DIAGNOSIS — M5116 Intervertebral disc disorders with radiculopathy, lumbar region: Secondary | ICD-10-CM | POA: Diagnosis not present

## 2015-04-05 DIAGNOSIS — M5416 Radiculopathy, lumbar region: Secondary | ICD-10-CM

## 2015-04-05 DIAGNOSIS — M533 Sacrococcygeal disorders, not elsewhere classified: Secondary | ICD-10-CM | POA: Diagnosis not present

## 2015-04-05 DIAGNOSIS — G90512 Complex regional pain syndrome I of left upper limb: Secondary | ICD-10-CM | POA: Diagnosis not present

## 2015-04-05 DIAGNOSIS — Z9889 Other specified postprocedural states: Secondary | ICD-10-CM | POA: Diagnosis not present

## 2015-04-05 DIAGNOSIS — Z96642 Presence of left artificial hip joint: Secondary | ICD-10-CM

## 2015-04-05 DIAGNOSIS — M47816 Spondylosis without myelopathy or radiculopathy, lumbar region: Secondary | ICD-10-CM

## 2015-04-05 DIAGNOSIS — M545 Low back pain: Secondary | ICD-10-CM | POA: Diagnosis present

## 2015-04-05 DIAGNOSIS — M5136 Other intervertebral disc degeneration, lumbar region: Secondary | ICD-10-CM

## 2015-04-05 DIAGNOSIS — M5030 Other cervical disc degeneration, unspecified cervical region: Secondary | ICD-10-CM

## 2015-04-05 HISTORY — DX: Sacrococcygeal disorders, not elsewhere classified: M53.3

## 2015-04-05 HISTORY — DX: Presence of left artificial hip joint: Z96.642

## 2015-04-05 NOTE — Progress Notes (Signed)
   Subjective:    Patient ID: Kelly Foster, female    DOB: 1950/04/10, 65 y.o.   MRN: [id number]  HPI  The patient is a 65 year old female who returns to pain management for further evaluation and treatment of pain involving the lower back and lower extremity region. The patient is status post total hip replacement on the left and continues to be with significant pain of the lower back region radiating from the back of the buttocks on the left as well as on the right. We discussed patient's condition and will consider patient for block of nerves to the sacroiliac joint at time return appointment. We will also consider modification of patient's medication regimen. The patient was with understanding and in agreement with suggested treatment plan.    Review of Systems     Objective:   Physical Exam   He was tends to palpation of the splenius capitis and a separate talus musculature regions of mild degree with mild tenderness of the cervical facet cervical paraspinal musculature region. Palpation over the region of the acromioclavicular and glenohumeral joint regions reproduces minimal discomfort. Palpation over the region of the thoracic facet thoracic paraspinal muscular treat and was with mild discomfort as well. Palpation over the lumbar paraspinal must reason lumbar facet region was with moderate tends to palpation. There was moderate to moderately severe tenderness of the PSIS and PII S regions. Straight leg raise was tolerates approximately 20 without increase of pain with dorsiflexion noted. Palpation of the gluteal and piriformis musculature regions reproduce moderate severe discomfort as well. EHL strength appeared to be decreased and there was no definite sensory deficit or dermatomal distribution detected. There was negative clonus negative Homans. Abdomen was nontender with no costovertebral tenderness noted.     Assessment & Plan:   Sacroiliac joint dysfunction  Degenerative  disc disease lumbar spine  Lumbar radiculopathy  Lumbar facet syndrome  Status post left total hip replacement  Complex regional pain syndrome of the left upper extremity  Status post surgical intervention of left upper extremity    PLAN   Continue present medications   Block of nerves to the sacroiliac joint to be performed at time return appointment  F/U PCP Dr. [name] for evaliation of  BP and general medical  condition.  F/U surgical evaluation. Patient will follow-up with Dr. [name] status post left total hip replacement. We will consider neurosurgical evaluation as well for evaluation of lower back and lower extremity pain paresthesias and weakness  F/U neurological evaluation. May consider PNCV/EMG studies and other studies pending follow-up evaluations  May consider radiofrequency rhizolysis or intraspinal procedures pending response to present treatment and F/U evaluation.  Patient to call [facility] Management Center should patient have concerns prior to scheduled return appointment.

## 2015-04-05 NOTE — Patient Instructions (Addendum)
PLAN   Continue present medications   Block of nerves to the sacroiliac joint to be performed at time return appointment  F/U PCP Dr. Lawerance Bach for evaliation of  BP and general medical  condition.  F/U surgical evaluation. Patient will follow-up with Dr. Rosita Kea status post left total hip replacement. We will consider neurosurgical evaluation as well for evaluation of lower back and lower extremity pain paresthesias and weakness  F/U neurological evaluation. May consider PNCV/EMG studies and other studies pending follow-up evaluations  May consider radiofrequency rhizolysis or intraspinal procedures pending response to present treatment and F/U evaluation.  Patient to call [facility] should patient have concerns prior to scheduled return appointment. Sacroiliac (SI) Joint Injection Patient Information  Description: The sacroiliac joint connects the scrum (very low back and tailbone) to the ilium (a pelvic bone which also forms half of the hip joint).  Normally this joint experiences very little motion.  When this joint becomes inflamed or unstable low back and or hip and pelvis pain may result.  Injection of this joint with local anesthetics (numbing medicines) and steroids can provide diagnostic information and reduce pain.  This injection is performed with the aid of x-ray guidance into the tailbone area while you are lying on your stomach.   You may experience an electrical sensation down the leg while this is being done.  You may also experience numbness.  We also may ask if we are reproducing your normal pain during the injection.  Conditions which may be treated SI injection:   Low back, buttock, hip or leg pain  Preparation for the Injection:  1. Do not eat any solid food or dairy products within 6 hours of your appointment.  2. You may drink clear liquids up to 2 hours before appointment.  Clear liquids include water, black coffee, juice or soda.  No milk or cream  please. 3. You may take your regular medications, including pain medications with a sip of water before your appointment.  Diabetics should hold regular insulin (if take separately) and take 1/2 normal NPH dose the morning of the procedure.  Carry some sugar containing items with you to your appointment. 4. A driver must accompany you and be prepared to drive you home after your procedure. 5. Bring all of your current medications with you. 6. An IV may be inserted and sedation may be given at the discretion of the physician. 7. A blood pressure cuff, EKG and other monitors will often be applied during the procedure.  Some patients may need to have extra oxygen administered for a short period.  8. You will be asked to provide medical information, including your allergies, prior to the procedure.  We must know immediately if you are taking blood thinners (like Coumadin/Warfarin) or if you are allergic to IV iodine contrast (dye).  We must know if you could possible be pregnant.  Possible side effects:   Bleeding from needle site  Infection (rare, may require surgery)  Nerve injury (rare)  Numbness & tingling (temporary)  A brief convulsion or seizure  Light-headedness (temporary)  Pain at injection site (several days)  Decreased blood pressure (temporary)  Weakness in the leg (temporary)   Call if you experience:   New onset weakness or numbness of an extremity below the injection site that last more than 8 hours.  Hives or difficulty breathing ( go to the emergency room)  Inflammation or drainage at the injection site  Any new symptoms which are concerning to  you  Please note:  Although the local anesthetic injected can often make your back/ hip/ buttock/ leg feel good for several hours after the injections, the pain will likely return.  It takes 3-7 days for steroids to work in the sacroiliac area.  You may not notice any pain relief for at least that one week.  If  effective, we will often do a series of three injections spaced 3-6 weeks apart to maximally decrease your pain.  After the initial series, we generally will wait some months before a repeat injection of the same type.  If you have any questions, please call [phone] [facility]  Pain Management Discharge Instructions  General Discharge Instructions :  If you need to reach your doctor call: Monday-Friday 8:00 am - 4:00 pm at [phone] or toll free [phone].  After clinic hours [phone] to have operator reach doctor.  Bring all of your medication bottles to all your appointments in the pain clinic.  To cancel or reschedule your appointment with Pain Management please remember to call 24 hours in advance to avoid a fee.  Refer to the educational materials which you have been given on: General Risks, I had my Procedure. Discharge Instructions, Post Sedation.  Post Procedure Instructions:  The drugs you were given will stay in your system until tomorrow, so for the next 24 hours you should not drive, make any legal decisions or drink any alcoholic beverages.  You may eat anything you prefer, but it is better to start with liquids then soups and crackers, and gradually work up to solid foods.  Please notify your doctor immediately if you have any unusual bleeding, trouble breathing or pain that is not related to your normal pain.  Depending on the type of procedure that was done, some parts of your body may feel week and/or numb.  This usually clears up by tonight or the next day.  Walk with the use of an assistive device or accompanied by an adult for the 24 hours.  You may use ice on the affected area for the first 24 hours.  Put ice in a Ziploc bag and cover with a towel and place against area 15 minutes on 15 minutes off.  You may switch to heat after 24 hours.GENERAL RISKS AND COMPLICATIONS  What are the risk, side effects and  possible complications? Generally speaking, most procedures are safe.  However, with any procedure there are risks, side effects, and the possibility of complications.  The risks and complications are dependent upon the sites that are lesioned, or the type of nerve block to be performed.  The closer the procedure is to the spine, the more serious the risks are.  Great care is taken when placing the radio frequency needles, block needles or lesioning probes, but sometimes complications can occur. 1. Infection: Any time there is an injection through the skin, there is a risk of infection.  This is why sterile conditions are used for these blocks.  There are four possible types of infection. 1. Localized skin infection. 2. Central Nervous System Infection-This can be in the form of Meningitis, which can be deadly. 3. Epidural Infections-This can be in the form of an epidural abscess, which can cause pressure inside of the spine, causing compression of the spinal cord with subsequent paralysis. This would require an emergency surgery to decompress, and there are no guarantees that the patient would recover from the paralysis. 4. Discitis-This is an infection of  the intervertebral discs.  It occurs in about 1% of discography procedures.  It is difficult to treat and it may lead to surgery.        2. Pain: the needles have to go through skin and soft tissues, will cause soreness.       3. Damage to internal structures:  The nerves to be lesioned may be near blood vessels or    other nerves which can be potentially damaged.       4. Bleeding: Bleeding is more common if the patient is taking blood thinners such as  aspirin, Coumadin, Ticiid, Plavix, etc., or if he/she have some genetic predisposition  such as hemophilia. Bleeding into the spinal canal can cause compression of the spinal  cord with subsequent paralysis.  This would require an emergency surgery to  decompress and there are no guarantees that the  patient would recover from the  paralysis.       5. Pneumothorax:  Puncturing of a lung is a possibility, every time a needle is introduced in  the area of the chest or upper back.  Pneumothorax refers to free air around the  collapsed lung(s), inside of the thoracic cavity (chest cavity).  Another two possible  complications related to a similar event would include: Hemothorax and Chylothorax.   These are variations of the Pneumothorax, where instead of air around the collapsed  lung(s), you may have blood or chyle, respectively.       6. Spinal headaches: They may occur with any procedures in the area of the spine.       7. Persistent CSF (Cerebro-Spinal Fluid) leakage: This is a rare problem, but may occur  with prolonged intrathecal or epidural catheters either due to the formation of a fistulous  track or a dural tear.       8. Nerve damage: By working so close to the spinal cord, there is always a possibility of  nerve damage, which could be as serious as a permanent spinal cord injury with  paralysis.       9. Death:  Although rare, severe deadly allergic reactions known as "Anaphylactic  reaction" can occur to any of the medications used.      10. Worsening of the symptoms:  We can always make thing worse.  What are the chances of something like this happening? Chances of any of this occuring are extremely low.  By statistics, you have more of a chance of getting killed in a motor vehicle accident: while driving to the hospital than any of the above occurring .  Nevertheless, you should be aware that they are possibilities.  In general, it is similar to taking a shower.  Everybody knows that you can slip, hit your head and get killed.  Does that mean that you should not shower again?  Nevertheless always keep in mind that statistics do not mean anything if you happen to be on the wrong side of them.  Even if a procedure has a 1 (one) in a 1,000,000 (million) chance of going wrong, it you happen to  be that one..Also, keep in mind that by statistics, you have more of a chance of having something go wrong when taking medications.  Who should not have this procedure? If you are on a blood thinning medication (e.g. Coumadin, Plavix, see list of "Blood Thinners"), or if you have an active infection going on, you should not have the procedure.  If you are taking any blood thinners,  please inform your physician.  How should I prepare for this procedure?  Do not eat or drink anything at least six hours prior to the procedure.  Bring a driver with you .  It cannot be a taxi.  Come accompanied by an adult that can drive you back, and that is strong enough to help you if your legs get weak or numb from the local anesthetic.  Take all of your medicines the morning of the procedure with just enough water to swallow them.  If you have diabetes, make sure that you are scheduled to have your procedure done first thing in the morning, whenever possible.  If you have diabetes, take only half of your insulin dose and notify our nurse that you have done so as soon as you arrive at the clinic.  If you are diabetic, but only take blood sugar pills (oral hypoglycemic), then do not take them on the morning of your procedure.  You may take them after you have had the procedure.  Do not take aspirin or any aspirin-containing medications, at least eleven (11) days prior to the procedure.  They may prolong bleeding.  Wear loose fitting clothing that may be easy to take off and that you would not mind if it got stained with Betadine or blood.  Do not wear any jewelry or perfume  Remove any nail coloring.  It will interfere with some of our monitoring equipment.  NOTE: Remember that this is not meant to be interpreted as a complete list of all possible complications.  Unforeseen problems may occur.  BLOOD THINNERS The following drugs contain aspirin or other products, which can cause increased bleeding  during surgery and should not be taken for 2 weeks prior to and 1 week after surgery.  If you should need take something for relief of minor pain, you may take acetaminophen which is found in Tylenol,m Datril, Anacin-3 and Panadol. It is not blood thinner. The products listed below are.  Do not take any of the products listed below in addition to any listed on your instruction sheet.  A.P.C or A.P.C with Codeine Codeine Phosphate Capsules #3 Ibuprofen Ridaura  ABC compound Congesprin Imuran rimadil  Advil Cope Indocin Robaxisal  Alka-Seltzer Effervescent Pain Reliever and Antacid Coricidin or Coricidin-D  Indomethacin Rufen  Alka-Seltzer plus Cold Medicine Cosprin Ketoprofen S-A-C Tablets  Anacin Analgesic Tablets or Capsules Coumadin Korlgesic Salflex  Anacin Extra Strength Analgesic tablets or capsules CP-2 Tablets Lanoril Salicylate  Anaprox Cuprimine Capsules Levenox Salocol  Anexsia-D Dalteparin Magan Salsalate  Anodynos Darvon compound Magnesium Salicylate Sine-off  Ansaid Dasin Capsules Magsal Sodium Salicylate  Anturane Depen Capsules Marnal Soma  APF Arthritis pain formula Dewitt's Pills Measurin Stanback  Argesic Dia-Gesic Meclofenamic Sulfinpyrazone  Arthritis Bayer Timed Release Aspirin Diclofenac Meclomen Sulindac  Arthritis pain formula Anacin Dicumarol Medipren Supac  Analgesic (Safety coated) Arthralgen Diffunasal Mefanamic Suprofen  Arthritis Strength Bufferin Dihydrocodeine Mepro Compound Suprol  Arthropan liquid Dopirydamole Methcarbomol with Aspirin Synalgos  ASA tablets/Enseals Disalcid Micrainin Tagament  Ascriptin Doan's Midol Talwin  Ascriptin A/D Dolene Mobidin Tanderil  Ascriptin Extra Strength Dolobid Moblgesic Ticlid  Ascriptin with Codeine Doloprin or Doloprin with Codeine Momentum Tolectin  Asperbuf Duoprin Mono-gesic Trendar  Aspergum Duradyne Motrin or Motrin IB Triminicin  Aspirin plain, buffered or enteric coated Durasal Myochrisine Trigesic  Aspirin  Suppositories Easprin Nalfon Trillsate  Aspirin with Codeine Ecotrin Regular or Extra Strength Naprosyn Uracel  Atromid-S Efficin Naproxen Ursinus  Auranofin Capsules Elmiron Neocylate Vanquish  Axotal  Emagrin Norgesic Verin  Azathioprine Empirin or Empirin with Codeine Normiflo Vitamin E  Azolid Emprazil Nuprin Voltaren  Bayer Aspirin plain, buffered or children's or timed BC Tablets or powders Encaprin Orgaran Warfarin Sodium  Buff-a-Comp Enoxaparin Orudis Zorpin  Buff-a-Comp with Codeine Equegesic Os-Cal-Gesic   Buffaprin Excedrin plain, buffered or Extra Strength Oxalid   Bufferin Arthritis Strength Feldene Oxphenbutazone   Bufferin plain or Extra Strength Feldene Capsules Oxycodone with Aspirin   Bufferin with Codeine Fenoprofen Fenoprofen Pabalate or Pabalate-SF   Buffets II Flogesic Panagesic   Buffinol plain or Extra Strength Florinal or Florinal with Codeine Panwarfarin   Buf-Tabs Flurbiprofen Penicillamine   Butalbital Compound Four-way cold tablets Penicillin   Butazolidin Fragmin Pepto-Bismol   Carbenicillin Geminisyn Percodan   Carna Arthritis Reliever Geopen Persantine   Carprofen Gold's salt Persistin   Chloramphenicol Goody's Phenylbutazone   Chloromycetin Haltrain Piroxlcam   Clmetidine heparin Plaquenil   Cllnoril Hyco-pap Ponstel   Clofibrate Hydroxy chloroquine Propoxyphen         Before stopping any of these medications, be sure to consult the physician who ordered them.  Some, such as Coumadin (Warfarin) are ordered to prevent or treat serious conditions such as "deep thrombosis", "pumonary embolisms", and other heart problems.  The amount of time that you may need off of the medication may also vary with the medication and the reason for which you were taking it.  If you are taking any of these medications, please make sure you notify your pain physician before you undergo any procedures.

## 2015-04-05 NOTE — Progress Notes (Signed)
Safety precautions to be maintained throughout the outpatient stay will include: orient to surroundings, keep bed in low position, maintain call bell within reach at all times, provide assistance with transfer out of bed and ambulation.  

## 2015-04-09 ENCOUNTER — Ambulatory Visit: Payer: Medicare Other | Admitting: Obstetrics and Gynecology

## 2015-04-16 ENCOUNTER — Telehealth: Payer: Self-pay | Admitting: *Deleted

## 2015-04-16 ENCOUNTER — Ambulatory Visit: Payer: Medicare Other | Admitting: Obstetrics and Gynecology

## 2015-04-16 NOTE — Telephone Encounter (Signed)
Dear Chip BoerVicki Please call patient and inform patient that you will see if other physicians in the pain clinic we will treat her Please let patient know that I would like for [facility] to be [facility] or [facility] prior to prescribing medication as she requested today. I will be happy to prescribe medication for patient once patient has obtained evaluation and recommendations from 1 of the tertiary pain clinics. At the same time the patient may be seen by another physician in the pain clinic if she desires to do such. Please see if another physician is willing to treat her. Also ask Risk Management if they have any recommendations regarding our response to patient to be certain that we comply with all regulations and policies. Please let me know if I need to address any other issues are need to do anything else in attempt to resolve this matter to the satisfaction of all. Thank you
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
## 2015-04-17 ENCOUNTER — Ambulatory Visit (INDEPENDENT_AMBULATORY_CARE_PROVIDER_SITE_OTHER): Payer: Medicare HMO | Admitting: Obstetrics and Gynecology

## 2015-04-17 ENCOUNTER — Encounter: Payer: Self-pay | Admitting: Obstetrics and Gynecology

## 2015-04-17 VITALS — BP 135/82 | HR 92 | Ht 67.5 in | Wt 197.0 lb

## 2015-04-17 DIAGNOSIS — N3281 Overactive bladder: Secondary | ICD-10-CM

## 2015-04-17 LAB — URINALYSIS, COMPLETE
BILIRUBIN UA: NEGATIVE
Glucose, UA: NEGATIVE
LEUKOCYTES UA: NEGATIVE
Nitrite, UA: NEGATIVE
PH UA: 5.5 (ref 5.0–7.5)
Protein, UA: NEGATIVE
RBC UA: NEGATIVE
Specific Gravity, UA: 1.025 (ref 1.005–1.030)
Urobilinogen, Ur: 0.2 mg/dL (ref 0.2–1.0)

## 2015-04-17 LAB — MICROSCOPIC EXAMINATION: BACTERIA UA: NONE SEEN

## 2015-04-17 LAB — BLADDER SCAN AMB NON-IMAGING

## 2015-04-17 NOTE — Progress Notes (Signed)
04/17/2015 10:53 AM   Kelly Foster August 05, 1950 [id number]  Referring provider: Hyman Hopes, MD [address] [place], [place] [address]  Chief Complaint  Patient presents with  . Over Active Bladder    HPI: Patient is a 65yo female with a history of urinary frequency for months. Patient has completed treatment for UTI without improvement in symptoms. She has tried Information systems manager in the past with some improvement in her symptoms but she was unable to afford the medication due to her insurance coverage. She was then placed on oxybutynin which improved versus symptoms significantly for a few months. Patient now reports that her symptoms are back to baseline and she is requesting a change in therapy.  Daytime frequency q . Nocturia q 15-60mins. + urgency with occasional urge incontinence   No LE edema, dysuria, bladder discomfort, hematuria, vaginal symptoms, fevers, nausea, vomiting or flank pain  Hx prediabetes, HTN, remote history of drug abuse (20 yrs ago), Bipolar, Depression     PMH: Past Medical History  Diagnosis Date  . Allergy   . Anxiety   . Hypertension   . Bipolar 1 disorder (HCC)   . Manic depressive disorder (HCC)   . Hyperlipidemia   . DJD (degenerative joint disease)   . Hip pain   . Prediabetes   . Arthritis   . Ulnar neuropathy   . Urinary frequency   . Overweight   . Vaginal atrophy     Surgical History: Past Surgical History  Procedure Laterality Date  . Arm surgery Left   . Abdominal hysterectomy    . Cesarean section    . Total hip arthroplasty Left 12/26/2014    Procedure: TOTAL HIP ARTHROPLASTY ANTERIOR APPROACH;  Surgeon: Kennedy Bucker, MD;  Location: [facility] ORS;  Service: Orthopedics;  Laterality: Left;  . Joint replacement Left 2016   dec    Home Medications:    Medication List       This list is accurate as of: 04/17/15 10:53 AM.  Always use your most recent med list.               acetaminophen 650 MG CR tablet   Commonly known as:  TYLENOL  Take 650-1,300 mg by mouth every 8 (eight) hours as needed for pain. Reported on 04/05/2015     acyclovir 400 MG tablet  Commonly known as:  ZOVIRAX  Take 400 mg by mouth 3 (three) times daily as needed (for flares).     albuterol 108 (90 Base) MCG/ACT inhaler  Commonly known as:  PROVENTIL HFA;VENTOLIN HFA  Inhale 2 puffs into the lungs every 6 (six) hours as needed for wheezing or shortness of breath.     azelastine 0.1 % nasal spray  Commonly known as:  ASTELIN  Place 1 spray into both nostrils 2 (two) times daily.     diclofenac 50 MG EC tablet  Commonly known as:  VOLTAREN  Take 50 mg by mouth 2 (two) times daily as needed for mild pain.     divalproex 500 MG 24 hr tablet  Commonly known as:  DEPAKOTE ER  Take 1,000 mg by mouth at bedtime.     enoxaparin 40 MG/0.4ML injection  Commonly known as:  LOVENOX  Inject 0.4 mLs (40 mg total) into the skin daily.     fluticasone 50 MCG/ACT nasal spray  Commonly known as:  FLONASE  Place 2 sprays into both nostrils daily as needed for rhinitis.     montelukast 10 MG tablet  Commonly known as:  SINGULAIR  Take 10 mg by mouth at bedtime. Reported on 04/05/2015     omeprazole 40 MG capsule  Commonly known as:  PRILOSEC  Take 40 mg by mouth daily. Reported on 04/05/2015     oxybutynin 5 MG 24 hr tablet  Commonly known as:  DITROPAN-XL  Take 1 tablet (5 mg total) by mouth at bedtime.     risperidone 4 MG tablet  Commonly known as:  RISPERDAL  Take 4 mg by mouth at bedtime.     sertraline 100 MG tablet  Commonly known as:  ZOLOFT  Take 200 mg by mouth at bedtime.     simvastatin 40 MG tablet  Commonly known as:  ZOCOR  Take 40 mg by mouth at bedtime.     traZODone 100 MG tablet  Commonly known as:  DESYREL  Take 200 mg by mouth at bedtime.     triamcinolone 0.1 % paste  Commonly known as:  KENALOG        Allergies:  Allergies  Allergen Reactions  . Codeine Itching  . Tramadol Itching     Family History: Family History  Problem Relation Age of Onset  . Stroke Father   . Colon cancer Sister   . Alzheimer's disease Mother   . Kidney disease Neg Hx     Social History:  reports that she quit smoking about 20 years ago. Her smoking use included Cigarettes. She smoked 1.00 pack per day. She has never used smokeless tobacco. She reports that she does not drink alcohol or use illicit drugs.  ROS:                                        Physical Exam: BP 135/82 mmHg  Pulse 92  Ht 5' 7.5" (1.715 m)  Wt 197 lb (89.359 kg)  BMI 30.38 kg/m2  Constitutional:  Alert and oriented, No acute distress. HEENT: [place] AT, moist mucus membranes.  Trachea midline, no masses. Cardiovascular: No clubbing, cyanosis, or edema. Respiratory: Normal respiratory effort, no increased work of breathing. Skin: No rashes, bruises or suspicious lesions. Neurologic: Grossly intact, no focal deficits, moving all 4 extremities. Psychiatric: Normal mood and affect.  Laboratory Data:  Lab Results  Component Value Date   WBC 6.1 01/04/2015   HGB 9.4* 01/04/2015   HCT 29.1* 01/04/2015   MCV 85.3 01/04/2015   PLT 605* 01/04/2015    Lab Results  Component Value Date   CREATININE 0.57 01/04/2015    No results found for: PSA  No results found for: TESTOSTERONE  No results found for: HGBA1C  Urinalysis    Component Value Date/Time   COLORURINE Yellow 02/23/2014 1353   APPEARANCEUR Hazy 02/23/2014 1353   LABSPEC 1.015 02/23/2014 1353   PHURINE 6.0 02/23/2014 1353   GLUCOSEU Negative 11/29/2014 1449   GLUCOSEU Negative 02/23/2014 1353   HGBUR Negative 02/23/2014 1353   BILIRUBINUR Negative 11/29/2014 1449   BILIRUBINUR Negative 02/23/2014 1353   KETONESUR Negative 02/23/2014 1353   PROTEINUR Negative 02/23/2014 1353   NITRITE Negative 11/29/2014 1449   NITRITE Negative 02/23/2014 1353   LEUKOCYTESUR Trace* 11/29/2014 1449   LEUKOCYTESUR Negative 02/23/2014  1353    Pertinent Imaging:   Assessment & Plan:    1. OAB (overactive bladder)- prior treatment with oxybutynin and Vesicare without significant improvement in urinary symptoms. Patient was provided samples of Myrbetriq 25 mg today.  She is also interested in initiating PTNS therapy for treatment of her urinary frequency. She'll be scheduled to start treatment with [facility]. - Urinalysis, Complete - BLADDER SCAN AMB NON-IMAGING  2. Microscopic Hematuria- urinalysis results available after patient left the office today. Microscopic hematuria and red blood cells 3-10 RBCs seen on today's analysis. Urine was sent for culture to rule out infection. Results and hematuria workup need to be discussed further with patient and her follow-up visit. Given patient's continued urinary frequency and now microscopic hematuria we will recommend that she pursue a CT urogram and cystoscopy.  Return for PTNS with Shannon/Discuss microscopic hematuria.  These notes generated with voice recognition software. I apologize for typographical errors.  Earlie Lou, FNP  [facility] [address] [place], [place] [address] [phone]

## 2015-04-18 ENCOUNTER — Ambulatory Visit: Payer: Medicare HMO | Admitting: Pain Medicine

## 2015-04-19 ENCOUNTER — Telehealth: Payer: Self-pay | Admitting: Obstetrics and Gynecology

## 2015-04-19 MED ORDER — MIRABEGRON ER 25 MG PO TB24: 25.0000 mg | ORAL_TABLET | Freq: Every day | ORAL | Status: DC

## 2015-04-19 NOTE — Telephone Encounter (Signed)
Patient called back to the office today to let Kelly LouLindsay Overton, NP know that the Myrbetriq is working well.  She would like for you to [facility] [place].

## 2015-04-19 NOTE — Telephone Encounter (Signed)
Done. Please notify patient. thanks

## 2015-04-24 ENCOUNTER — Telehealth: Payer: Self-pay | Admitting: *Deleted

## 2015-04-24 NOTE — Telephone Encounter (Signed)
LMOM that I was trying to get her set up for the PTNS procedure but unfortunately I could not get the procedure approved through her insurance company. Procedure is not covered. If any questions please call the office back at [phone].

## 2015-05-23 ENCOUNTER — Telehealth: Payer: Self-pay | Admitting: Urology

## 2015-05-23 NOTE — Telephone Encounter (Signed)
[name] called saying the Rx cost for Myrbetriq is $8.25 for thirty pills and she can't afford that. She's wondering if a Rx of Imitram HCL 25mg  (sp?) will work for her instead or something else that she can better afford. She'd like a phone call regarding this.  Pt's ph# [phone] Thank you.

## 2015-05-23 NOTE — Telephone Encounter (Signed)
If she can't afford the Myrbetriq, we can give her samples.

## 2015-05-23 NOTE — Telephone Encounter (Signed)
Pt has been made aware of this and wants to get samples tomorrow. Thank you.

## 2015-06-04 ENCOUNTER — Telehealth: Payer: Self-pay | Admitting: Urology

## 2015-06-04 DIAGNOSIS — N390 Urinary tract infection, site not specified: Secondary | ICD-10-CM

## 2015-06-04 NOTE — Telephone Encounter (Signed)
When patient last saw us, she had blood in her urine.  She was to have an urine culture, but this was not done.  I would like to have her have an UA and urine culture prior to changing her Myrbetriq.

## 2015-06-04 NOTE — Telephone Encounter (Signed)
Pt said Myrbetriq 25 mg is NOT working anymore.  Her sister takes imipram HCL 25 mg for overactive bladder.  She was wondering if this might work better for her [phone]

## 2015-06-05 NOTE — Telephone Encounter (Signed)
Spoke with pt in reference to needing a ucx prior to changing medication. Pt voiced understanding. Pt will RTC on Friday for a u/a and ucx.

## 2015-06-08 ENCOUNTER — Other Ambulatory Visit: Payer: Medicare HMO

## 2015-06-08 DIAGNOSIS — N390 Urinary tract infection, site not specified: Secondary | ICD-10-CM

## 2015-06-09 LAB — URINALYSIS, COMPLETE
Bilirubin, UA: NEGATIVE
GLUCOSE, UA: NEGATIVE
Ketones, UA: NEGATIVE
Leukocytes, UA: NEGATIVE
NITRITE UA: NEGATIVE
PH UA: 6 (ref 5.0–7.5)
Protein, UA: NEGATIVE
RBC, UA: NEGATIVE
Specific Gravity, UA: 1.015 (ref 1.005–1.030)
Urobilinogen, Ur: 0.2 mg/dL (ref 0.2–1.0)

## 2015-06-09 LAB — MICROSCOPIC EXAMINATION
Bacteria, UA: NONE SEEN
WBC UA: NONE SEEN /HPF (ref 0–?)

## 2015-06-10 LAB — CULTURE, URINE COMPREHENSIVE

## 2015-06-11 ENCOUNTER — Telehealth: Payer: Self-pay

## 2015-06-11 DIAGNOSIS — R3129 Other microscopic hematuria: Secondary | ICD-10-CM

## 2015-06-11 NOTE — Telephone Encounter (Signed)
-----   Message from Harle BattiestShannon A McGowan, PA-C sent at 06/10/2015  7:13 PM EDT ----- Patient's urine culture was negative.  Due to her urinary symptoms and her lack of responsiveness to medications with an episode of microscopic hematuria, I suggest she undergo CT urogram and cystoscopy.

## 2015-06-11 NOTE — Telephone Encounter (Signed)
Spoke with pt in reference to -ucx. Made aware Kelly Foster would like to have a CT and cysto performed due to micro hematuria. Pt voiced understanding. Pt was transferred to the front to make appts. Orders placed.

## 2015-06-25 ENCOUNTER — Ambulatory Visit
Admission: RE | Admit: 2015-06-25 | Discharge: 2015-06-25 | Disposition: A | Discharge status: Home / Self Care | Payer: Medicare HMO | Source: Ambulatory Visit | Attending: Urology | Admitting: Urology

## 2015-06-25 DIAGNOSIS — R3129 Other microscopic hematuria: Secondary | ICD-10-CM

## 2015-06-25 DIAGNOSIS — Q631 Lobulated, fused and horseshoe kidney: Secondary | ICD-10-CM | POA: Insufficient documentation

## 2015-06-25 LAB — POCT I-STAT CREATININE: CREATININE: 0.8 mg/dL (ref 0.44–1.00)

## 2015-06-25 MED ORDER — IOPAMIDOL (ISOVUE-300) INJECTION 61%
125.0000 mL | Freq: Once | INTRAVENOUS | Status: AC | PRN
  Administered 2015-06-25: 125 mL via INTRAVENOUS

## 2015-07-05 ENCOUNTER — Other Ambulatory Visit: Payer: Medicare HMO

## 2015-07-06 ENCOUNTER — Ambulatory Visit (INDEPENDENT_AMBULATORY_CARE_PROVIDER_SITE_OTHER): Payer: Medicare HMO | Admitting: Urology

## 2015-07-06 ENCOUNTER — Encounter: Payer: Self-pay | Admitting: Urology

## 2015-07-06 VITALS — BP 115/73 | HR 80 | Ht 67.0 in | Wt 214.6 lb

## 2015-07-06 DIAGNOSIS — N3281 Overactive bladder: Secondary | ICD-10-CM

## 2015-07-06 DIAGNOSIS — R3129 Other microscopic hematuria: Secondary | ICD-10-CM

## 2015-07-06 DIAGNOSIS — Q631 Lobulated, fused and horseshoe kidney: Secondary | ICD-10-CM

## 2015-07-06 LAB — MICROSCOPIC EXAMINATION: BACTERIA UA: NONE SEEN

## 2015-07-06 LAB — URINALYSIS, COMPLETE
BILIRUBIN UA: NEGATIVE
GLUCOSE, UA: NEGATIVE
KETONES UA: NEGATIVE
NITRITE UA: NEGATIVE
Protein, UA: NEGATIVE
RBC UA: NEGATIVE
SPEC GRAV UA: 1.015 (ref 1.005–1.030)
UUROB: 0.2 mg/dL (ref 0.2–1.0)
pH, UA: 6 (ref 5.0–7.5)

## 2015-07-06 MED ORDER — LIDOCAINE HCL 2 % EX GEL
1.0000 "application " | Freq: Once | CUTANEOUS | Status: AC
  Administered 2015-07-06: 1 via URETHRAL

## 2015-07-06 MED ORDER — CIPROFLOXACIN HCL 500 MG PO TABS
500.0000 mg | ORAL_TABLET | Freq: Once | ORAL | Status: AC
  Administered 2015-07-06: 500 mg via ORAL

## 2015-07-06 NOTE — Progress Notes (Signed)
07/06/2015 11:39 AM   Kelly Foster 05/04/1950 [id number]  Referring provider: Hyman HopesHarriett P Burns, MD [address] [place], [place] [id number]  Chief Complaint  Patient presents with  . Cysto    microscopic hematuria    HPI: The patient is a 65 year old female presents today to discuss microscopic hematuria and her overactive bladder  1. 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 Normal cystoscopy today.  2. Overactive bladder The patient has failed Ditropan and Vesicare. She was tried on Myrbetriq 25 mg daily which initially worked but it has since lost its efficacy.   Her complaints include daytime frequency up to every 15 minutes. She also has urgency with occasional urge incontinence. She also has nocturia sometimes up to 4 times per hour.  She was offered PTNS at her last appointment was nurse about being stuck with a needle.   PMH: Past Medical History  Diagnosis Date  . Allergy   . Anxiety   . Hypertension   . Bipolar 1 disorder (HCC)   . Manic depressive disorder (HCC)   . Hyperlipidemia   . DJD (degenerative joint disease)   . Hip pain   . Prediabetes   . Arthritis   . Ulnar neuropathy   . Urinary frequency   . Overweight   . Vaginal atrophy     Surgical History: Past Surgical History  Procedure Laterality Date  . Arm surgery Left   . Abdominal hysterectomy    . Cesarean section    . Total hip arthroplasty Left 12/26/2014    Procedure: TOTAL HIP ARTHROPLASTY ANTERIOR APPROACH;  Surgeon: Kennedy BuckerMichael Menz, MD;  Location: [facility] ORS;  Service: Orthopedics;  Laterality: Left;  . Joint replacement Left 2016   dec    Home Medications:    Medication List       This list is accurate as of: 07/06/15 11:39 AM.  Always use your most recent med list.               acetaminophen 650 MG CR tablet  Commonly known as:  TYLENOL  Take 650-1,300 mg by mouth every 8 (eight) hours as needed for pain. Reported on 07/06/2015     acyclovir 400 MG tablet  Commonly known as:  ZOVIRAX  Take 400 mg by mouth 3 (three) times daily as needed (for flares).     albuterol 108 (90 Base) MCG/ACT inhaler  Commonly known as:  PROVENTIL HFA;VENTOLIN HFA  Inhale 2 puffs into the lungs every 6 (six) hours as needed for wheezing or shortness of breath.     azelastine 0.1 % nasal spray  Commonly known as:  ASTELIN  Place 1 spray into both nostrils 2 (two) times daily.     baclofen 10 MG tablet  Commonly known as:  LIORESAL  Take 10 mg by mouth 3 (three) times daily.     Calcium Carbonate-Vitamin D3 600-400 MG-UNIT Tabs  Take by mouth.     diclofenac 50 MG EC tablet  Commonly known as:  VOLTAREN  Take 50 mg by mouth 2 (two) times daily as needed for mild pain.     divalproex 500 MG 24 hr tablet  Commonly known as:  DEPAKOTE ER  Take 1,000 mg by mouth at bedtime.     enoxaparin 40 MG/0.4ML injection  Commonly known as:  LOVENOX  Inject 0.4 mLs (40 mg total) into the skin daily.     fluticasone 50 MCG/ACT nasal spray  Commonly known as:  FLONASE  Place  2 sprays into both nostrils daily as needed for rhinitis. Reported on 07/06/2015     GOODSENSE ASPIRIN 325 MG tablet  Generic drug:  aspirin  Take 325 mg by mouth.     LYRICA 50 MG capsule  Generic drug:  pregabalin     mirabegron ER 25 MG Tb24 tablet  Commonly known as:  MYRBETRIQ  Take 1 tablet (25 mg total) by mouth daily.     montelukast 10 MG tablet  Commonly known as:  SINGULAIR  Take 10 mg by mouth at bedtime. Reported on 04/05/2015     omeprazole 40 MG capsule  Commonly known as:  PRILOSEC  Take 40 mg by mouth daily. Reported on 04/05/2015     risperidone 4 MG tablet  Commonly known as:  RISPERDAL  Take 4 mg by mouth at bedtime.     sertraline 100 MG tablet  Commonly known as:  ZOLOFT  Take 200 mg by mouth at bedtime.     simvastatin 40 MG tablet  Commonly known as:  ZOCOR  Take 40 mg by mouth at bedtime.     traZODone 100 MG tablet  Commonly  known as:  DESYREL  Take 200 mg by mouth at bedtime.     triamcinolone 0.1 % paste  Commonly known as:  KENALOG     triamcinolone cream 0.1 %  Commonly known as:  KENALOG        Allergies:  Allergies  Allergen Reactions  . Gabapentin Other (See Comments)    tremors  . Codeine Itching  . Tramadol Itching    Family History: Family History  Problem Relation Age of Onset  . Stroke Father   . Colon cancer Sister   . Alzheimer's disease Mother   . Kidney disease Neg Hx     Social History:  reports that she quit smoking about 20 years ago. Her smoking use included Cigarettes. She smoked 1.00 pack per day. She has never used smokeless tobacco. She reports that she does not drink alcohol or use illicit drugs.  ROS:                                        Physical Exam: BP 115/73 mmHg  Pulse 80  Ht 5\' 7"  (1.702 m)  Wt 214 lb 9.6 oz (97.342 kg)  BMI 33.60 kg/m2  Constitutional:  Alert and oriented, No acute distress. HEENT: [place] AT, moist mucus membranes.  Trachea midline, no masses. Cardiovascular: No clubbing, cyanosis, or edema. Respiratory: Normal respiratory effort, no increased work of breathing. GI: Abdomen is soft, nontender, nondistended, no abdominal masses GU: No CVA tenderness.  Skin: No rashes, bruises or suspicious lesions. Lymph: No cervical or inguinal adenopathy. Neurologic: Grossly intact, no focal deficits, moving all 4 extremities. Psychiatric: Normal mood and affect.  Laboratory Data: Lab Results  Component Value Date   WBC 6.1 01/04/2015   HGB 9.4* 01/04/2015   HCT 29.1* 01/04/2015   MCV 85.3 01/04/2015   PLT 605* 01/04/2015    Lab Results  Component Value Date   CREATININE 0.80 06/25/2015    No results found for: PSA  No results found for: TESTOSTERONE  No results found for: HGBA1C  Urinalysis    Component Value Date/Time   COLORURINE Yellow 02/23/2014 1353   APPEARANCEUR Clear 06/08/2015 1413    APPEARANCEUR Hazy 02/23/2014 1353   LABSPEC 1.015 02/23/2014 1353   PHURINE 6.0 02/23/2014  1353   GLUCOSEU Negative 06/08/2015 1413   GLUCOSEU Negative 02/23/2014 1353   HGBUR Negative 02/23/2014 1353   BILIRUBINUR Negative 06/08/2015 1413   BILIRUBINUR Negative 02/23/2014 1353   KETONESUR Negative 02/23/2014 1353   PROTEINUR Negative 06/08/2015 1413   PROTEINUR Negative 02/23/2014 1353   NITRITE Negative 06/08/2015 1413   NITRITE Negative 02/23/2014 1353   LEUKOCYTESUR Negative 06/08/2015 1413   LEUKOCYTESUR Negative 02/23/2014 1353    Pertinent Imaging: CLINICAL DATA: Urinary frequency. Urinary tract infections. Micro hematuria.  EXAM: CT ABDOMEN AND PELVIS WITHOUT AND WITH CONTRAST  TECHNIQUE: Multidetector CT imaging of the abdomen and pelvis was performed following the standard protocol before and following the bolus administration of intravenous contrast.  CONTRAST: ISOVUE-300 IOPAMIDOL (ISOVUE-300) INJECTION 61%  COMPARISON: None.  FINDINGS: Lower chest: Lung bases are clear.  Hepatobiliary: No focal hepatic lesion. No biliary duct dilatation. Gallbladder is normal. Common bile duct is normal.  Pancreas: Pancreas is normal. No ductal dilatation. No pancreatic inflammation.  Spleen: Normal spleen  Adrenals/urinary tract: Adrenal glands are normal  No nephrolithiasis or ureterolithiasis. Horseshoe kidney anatomy. No enhancing renal cortical lesion. Delayed imaging demonstrates no filling defects within the collecting systems or ureters.  No bladder calculi. No enhancing bladder lesion. No filling defect the bladder  Stomach/Bowel: Stomach, small bowel, appendix, and cecum are normal.  Several diverticular the descending colon sigmoid colon. No inflammation.  Vascular/Lymphatic: Abdominal aorta is normal caliber with atherosclerotic calcification. There is no retroperitoneal or periportal lymphadenopathy. No pelvic  lymphadenopathy.  Reproductive: Post hysterectomy.  Other: No free fluid.  Musculoskeletal: No aggressive osseous lesion.  IMPRESSION: 1. No explanation for hematuria. No nephrolithiasis, ureterolithiasis, enhancing renal cortical lesion, or filling defects within the collecting systems. 2. Horseshoe kidney anatomy. 3. No bladder stones or filling defects in the bladder which does not excluded a bladder lesion.    Cystoscopy Procedure Note  Patient identification was confirmed, informed consent was obtained, and patient was prepped using Betadine solution.  Lidocaine jelly was administered per urethral meatus.    Preoperative abx where received prior to procedure.    Procedure: - Flexible cystoscope introduced, without any difficulty.   - Thorough search of the bladder revealed:    normal urethral meatus    normal urothelium    no stones    no ulcers     no tumors    no urethral polyps    no trabeculation  - Ureteral orifices were normal in position and appearance.  Post-Procedure: - Patient tolerated the procedure well    Assessment & Plan:    1. Microscopic hematuria Negative work up  2. Overactive bladder -increase Myrbetriq to 50 mg daily. Will try to provide patient with charity samples as she cannot afford her medication.  -if she fails maximum dose myrbetriq, patient will need PTNS. This was discussed with the patient.  3. Horseshoe kidney Normal variant. No further work up needed.  Return in about 3 months (around 10/06/2015).  Hildred Laser, MD  [facility] [address], [address] [place], [place] [address] [phone]

## 2015-07-19 ENCOUNTER — Other Ambulatory Visit: Payer: Self-pay | Admitting: Orthopedic Surgery

## 2015-07-19 DIAGNOSIS — M79602 Pain in left arm: Secondary | ICD-10-CM

## 2015-07-25 ENCOUNTER — Ambulatory Visit: Payer: Medicare Other | Admitting: Urology

## 2015-08-01 DIAGNOSIS — Z0289 Encounter for other administrative examinations: Secondary | ICD-10-CM | POA: Insufficient documentation

## 2015-08-01 HISTORY — DX: Encounter for other administrative examinations: Z02.89

## 2015-08-09 ENCOUNTER — Ambulatory Visit
Admission: RE | Admit: 2015-08-09 | Discharge: 2015-08-09 | Disposition: A | Discharge status: Home / Self Care | Payer: Medicare HMO | Source: Ambulatory Visit | Attending: Orthopedic Surgery | Admitting: Orthopedic Surgery

## 2015-08-09 DIAGNOSIS — R937 Abnormal findings on diagnostic imaging of other parts of musculoskeletal system: Secondary | ICD-10-CM | POA: Insufficient documentation

## 2015-08-09 DIAGNOSIS — M79602 Pain in left arm: Secondary | ICD-10-CM | POA: Insufficient documentation

## 2015-08-09 LAB — POCT I-STAT CREATININE: Creatinine, Ser: 0.8 mg/dL (ref 0.44–1.00)

## 2015-08-09 MED ORDER — GADOBENATE DIMEGLUMINE 529 MG/ML IV SOLN
20.0000 mL | Freq: Once | INTRAVENOUS | Status: AC | PRN
  Administered 2015-08-09: 20 mL via INTRAVENOUS

## 2015-08-30 ENCOUNTER — Other Ambulatory Visit: Payer: Self-pay | Admitting: Internal Medicine

## 2015-08-30 DIAGNOSIS — Z1231 Encounter for screening mammogram for malignant neoplasm of breast: Secondary | ICD-10-CM

## 2015-09-17 ENCOUNTER — Ambulatory Visit (INDEPENDENT_AMBULATORY_CARE_PROVIDER_SITE_OTHER): Payer: Medicare HMO | Admitting: Urology

## 2015-09-17 ENCOUNTER — Encounter: Payer: Self-pay | Admitting: Urology

## 2015-09-17 VITALS — BP 132/79 | HR 80 | Ht 67.5 in | Wt 221.1 lb

## 2015-09-17 DIAGNOSIS — R3129 Other microscopic hematuria: Secondary | ICD-10-CM | POA: Diagnosis not present

## 2015-09-17 DIAGNOSIS — R351 Nocturia: Secondary | ICD-10-CM | POA: Diagnosis not present

## 2015-09-17 DIAGNOSIS — R350 Frequency of micturition: Secondary | ICD-10-CM | POA: Diagnosis not present

## 2015-09-17 LAB — URINALYSIS, COMPLETE
Bilirubin, UA: NEGATIVE
Glucose, UA: NEGATIVE
KETONES UA: NEGATIVE
NITRITE UA: NEGATIVE
PH UA: 7 (ref 5.0–7.5)
Protein, UA: NEGATIVE
SPEC GRAV UA: 1.01 (ref 1.005–1.030)
Urobilinogen, Ur: 0.2 mg/dL (ref 0.2–1.0)

## 2015-09-17 LAB — MICROSCOPIC EXAMINATION

## 2015-09-17 LAB — BLADDER SCAN AMB NON-IMAGING: Scan Result: 24

## 2015-09-17 MED ORDER — OXYBUTYNIN CHLORIDE ER 15 MG PO TB24: 15.0000 mg | ORAL_TABLET | Freq: Every day | ORAL | 0 refills | Status: DC

## 2015-09-17 NOTE — Progress Notes (Signed)
09/17/2015 10:29 PM   Kelly Foster 11/21/1950 [id number]  Referring provider: Hyman HopesHarriett P Burns, MD No address on file  Chief Complaint  Patient presents with  . 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  Patient states that she is experiencing frequency and urination, urinary urgency and nocturia.  He states that during the day she is making trips to the restrooms approximately every 30 minutes.  She also states that she is getting up 2-3 times at night.  She is not experiencing dysuria, gross hematuria or suprapubic pain. She is also not had any recent fevers, chills, nausea or vomiting.  She had recently concluded a hematuria workup and no GU malignancies were noted.  Does not report any episodes of gross hematuria. Her UA today demonstrates 3-11 RBCs per high-power field. Her PVR is 24 mL.  She has had a trial of oxybutynin, Vesicare and Myrbetriq at 25 mg daily. The Myrbetriq was increased to 50 mg daily, but that did not improve her efficacy.    PMH: Past Medical History:  Diagnosis Date  . Allergy   . Anxiety   . Arthritis   . Bipolar 1 disorder (HCC)   . DJD (degenerative joint disease)   . Hip pain   . Hyperlipidemia   . Hypertension   . Manic depressive disorder (HCC)   . Overweight   . Prediabetes   . Ulnar neuropathy   . Urinary frequency   . Vaginal atrophy     Surgical History: Past Surgical History:  Procedure Laterality Date  . ABDOMINAL HYSTERECTOMY    . arm surgery Left   . CESAREAN SECTION    . JOINT REPLACEMENT Left 2016   dec  . TOTAL HIP ARTHROPLASTY Left 12/26/2014   Procedure: TOTAL HIP ARTHROPLASTY ANTERIOR APPROACH;  Surgeon: Kennedy BuckerMichael Menz, MD;  Location: [facility] ORS;  Service: Orthopedics;  Laterality: Left;      Home Medications:    Medication List       Accurate as of 09/17/15 11:59 PM. Always use your most recent med list.          acetaminophen 650 MG CR tablet Commonly known as:  TYLENOL Take 650-1,300 mg by mouth every 8 (eight) hours as needed for pain. Reported on 07/06/2015   acyclovir 400 MG tablet Commonly known as:  ZOVIRAX Take 400 mg by mouth 3 (three) times daily as needed (for flares).   albuterol 108 (90 Base) MCG/ACT inhaler Commonly known as:  PROVENTIL HFA;VENTOLIN HFA Inhale 2 puffs into the lungs every 6 (six) hours as needed for wheezing or shortness of breath.   ARTIFICIAL TEARS 0.1-0.3 % Soln Administer 1 drop to both eyes 4 (four) times a day as needed.   azelastine 0.1 % nasal spray Commonly known as:  ASTELIN Place 1 spray into both nostrils 2 (two) times daily.   baclofen 10 MG tablet Commonly known as:  LIORESAL Take by mouth.   CALCIUM 600+D PLUS MINERALS 600-400 MG-UNIT Tabs Take by mouth.   Calcium Carbonate-Vitamin D3 600-400 MG-UNIT Tabs Take by mouth.   diclofenac 50 MG EC tablet Commonly known as:  VOLTAREN Take 50 mg by mouth 2 (two) times daily as needed for mild pain.   divalproex 500 MG 24 hr tablet Commonly known as:  DEPAKOTE ER Take  by mouth.   enoxaparin 40 MG/0.4ML injection Commonly known as:  LOVENOX Inject 0.4 mLs (40 mg total) into the skin daily.   fluticasone 50 MCG/ACT nasal spray Commonly known as:  FLONASE Place 2 sprays into both nostrils daily as needed for rhinitis. Reported on 07/06/2015   gabapentin 300 MG capsule Commonly known as:  NEURONTIN Take by mouth.   GOODSENSE ASPIRIN 325 MG tablet Generic drug:  aspirin Take 325 mg by mouth.   HYDROcodone-acetaminophen 5-325 MG tablet Commonly known as:  NORCO/VICODIN Take by mouth.   lidocaine 2 % solution Commonly known as:  XYLOCAINE 300 mg.   LYRICA 50 MG capsule Generic drug:  pregabalin   mirabegron ER 25 MG Tb24 tablet Commonly known as:   MYRBETRIQ Take 1 tablet (25 mg total) by mouth daily.   montelukast 10 MG tablet Commonly known as:  SINGULAIR Take 10 mg by mouth at bedtime. Reported on 04/05/2015   Olopatadine HCl 0.2 % Soln Apply to eye.   omeprazole 40 MG capsule Commonly known as:  PRILOSEC Take 40 mg by mouth daily. Reported on 04/05/2015   oxybutynin 15 MG 24 hr tablet Commonly known as:  DITROPAN XL Take 1 tablet (15 mg total) by mouth at bedtime.   Polyethyl Glycol-Propyl Glycol 0.4-0.3 % Soln Apply to eye.   PROCTOSOL HC 2.5 % rectal cream Generic drug:  hydrocortisone   risperidone 4 MG tablet Commonly known as:  RISPERDAL Take 4 mg by mouth at bedtime.   sertraline 100 MG tablet Commonly known as:  ZOLOFT Take 200 mg by mouth at bedtime.   simvastatin 40 MG tablet Commonly known as:  ZOCOR Take 40 mg by mouth at bedtime.   Spacer/Aero Chamber [employer] by Does not apply route.   traZODone 100 MG tablet Commonly known as:  DESYREL Take 200 mg by mouth at bedtime.   triamcinolone 0.1 % paste Commonly known as:  KENALOG   triamcinolone cream 0.1 % Commonly known as:  KENALOG       Allergies:  Allergies  Allergen Reactions  . Gabapentin Other (See Comments)    tremors tremors  . Codeine Itching  . Pregabalin Other (See Comments)    Left side pain under arm, started after use of lyrica  . Tramadol Itching    Family History: Family History  Problem Relation Age of Onset  . Alzheimer's disease Mother   . Stroke Father   . Colon cancer Sister   . Kidney disease Neg Hx     Social History:  reports that she quit smoking about 20 years ago. Her smoking use included Cigarettes. She smoked 1.00 pack per day. She has never used smokeless tobacco. She reports that she does not drink alcohol or use drugs.  ROS: UROLOGY Frequent Urination?: Yes Hard to postpone urination?: Yes Burning/pain with urination?: No Get up at night to urinate?: Yes Leakage of urine?: No Urine  stream starts and stops?: No Trouble starting stream?: No Do you have to strain to urinate?: No Blood in urine?: No Urinary tract infection?: No Sexually transmitted disease?: No Injury to kidneys or bladder?: No Painful intercourse?: No Weak stream?: No Currently pregnant?: No Vaginal bleeding?: No Last menstrual period?: n  Gastrointestinal Nausea?: No Vomiting?: No Indigestion/heartburn?: No Diarrhea?: No Constipation?: No  Constitutional Fever: No Night sweats?: No Weight loss?: No Fatigue?: No  Skin Skin rash/lesions?: No Itching?: No  Eyes Blurred vision?: No Double vision?: No  Ears/Nose/Throat Sore throat?: No Sinus problems?: Yes  Hematologic/Lymphatic  Swollen glands?: No Easy bruising?: No  Cardiovascular Leg swelling?: No Chest pain?: No  Respiratory Cough?: No Shortness of breath?: Yes  Endocrine Excessive thirst?: No  Musculoskeletal Back pain?: Yes Joint pain?: Yes  Neurological Headaches?: No Dizziness?: No  Psychologic Depression?: Yes Anxiety?: Yes  Physical Exam: BP 132/79   Pulse 80   Ht 5' 7.5" (1.715 m)   Wt 221 lb 1.6 oz (100.3 kg)   BMI 34.12 kg/m   Constitutional: Well nourished. Alert and oriented, No acute distress. HEENT: [place] AT, moist mucus membranes. Trachea midline, no masses. Cardiovascular: No clubbing, cyanosis, or edema. Respiratory: Normal respiratory effort, no increased work of breathing. GI: Abdomen is soft, non tender, non distended, no abdominal masses. Liver and spleen not palpable.  No hernias appreciated.  Stool sample for occult testing is not indicated.   GU: No CVA tenderness.  No bladder fullness or masses.   Skin: No rashes, bruises or suspicious lesions. Lymph: No cervical or inguinal adenopathy. Neurologic: Grossly intact, no focal deficits, moving all 4 extremities. Psychiatric: Normal mood and affect.  Laboratory Data: Lab Results  Component Value Date   WBC 6.1 01/04/2015   HGB  9.4 (L) 01/04/2015   HCT 29.1 (L) 01/04/2015   MCV 85.3 01/04/2015   PLT 605 (H) 01/04/2015    Lab Results  Component Value Date   CREATININE 0.80 08/09/2015    Lab Results  Component Value Date   TSH 1.078 12/20/2014       Component Value Date/Time   CHOL 152 02/23/2014 1620   HDL 42 02/23/2014 1620   VLDL 34 02/23/2014 1620   LDLCALC 76 02/23/2014 1620    Lab Results  Component Value Date   AST 13 (L) 01/04/2015   Lab Results  Component Value Date   ALT 11 (L) 01/04/2015     Urinalysis Significant 3-11 RBC's/hpf.  See EPIC .    Pertinent Imaging: Results for Kelly Foster, Kelly Foster (MRN [id number]) as of 09/17/2015 14:18  Ref. Range 09/17/2015 13:51  Scan Result Unknown 24    Assessment & Plan:    1. Urinary frequency  - failed Myrbetriq  - not interested in PTNS at this time due to the time commitment  - start oxybutynin ER 15 mg bid as this has worked for her in the past  - reminded her of the side effects, dry mouth, constipation and mental status changes  - Urinalysis, Complete  - BLADDER SCAN AMB NON-IMAGING  2. Nocturia  - see above  - if nocturia persists with the oxybutynin, will ask for sleep apnea evaluation by PCP  3. Microscopic hematuria  - negative hematuria work up with CT Urogram and cystoscopy in 2017  - refer to nephrology for further evaluation   Return in about 3 months (around 12/18/2015) for PVR and symptom recheck.  These notes generated with voice recognition software. I apologize for typographical errors.  Michiel CowboySHANNON Linsey Arteaga, PA-C  [facility] [address], [address] [place], [place] [address] [phone]

## 2015-10-03 ENCOUNTER — Telehealth: Payer: Self-pay | Admitting: *Deleted

## 2015-10-03 NOTE — Telephone Encounter (Signed)
Patient called asking when she was supposed to see the kidney doctor. Her las ov with Carollee HerterShannon referred her to nephrology. I let patient know that it went to our referral department and that after insurance verification the appointment would be made and someone will call her with the appointment. Patient ok with plan.

## 2015-10-04 ENCOUNTER — Other Ambulatory Visit: Payer: Self-pay | Admitting: Internal Medicine

## 2015-10-04 ENCOUNTER — Ambulatory Visit
Admission: RE | Admit: 2015-10-04 | Discharge: 2015-10-04 | Disposition: A | Discharge status: Home / Self Care | Payer: Medicare HMO | Source: Ambulatory Visit | Attending: Internal Medicine | Admitting: Internal Medicine

## 2015-10-04 DIAGNOSIS — Z1231 Encounter for screening mammogram for malignant neoplasm of breast: Secondary | ICD-10-CM | POA: Diagnosis present

## 2015-10-05 ENCOUNTER — Ambulatory Visit: Payer: Medicare HMO

## 2015-10-11 ENCOUNTER — Encounter: Payer: Self-pay | Admitting: Urology

## 2015-10-11 ENCOUNTER — Ambulatory Visit: Payer: Medicare HMO

## 2015-10-11 ENCOUNTER — Ambulatory Visit: Payer: Medicare HMO | Admitting: Urology

## 2015-10-15 ENCOUNTER — Encounter: Payer: Self-pay | Admitting: Urology

## 2015-10-15 ENCOUNTER — Ambulatory Visit (INDEPENDENT_AMBULATORY_CARE_PROVIDER_SITE_OTHER): Payer: Medicare HMO | Admitting: Urology

## 2015-10-15 VITALS — BP 113/75 | HR 108 | Ht 67.0 in | Wt 217.0 lb

## 2015-10-15 DIAGNOSIS — R351 Nocturia: Secondary | ICD-10-CM | POA: Diagnosis not present

## 2015-10-15 DIAGNOSIS — R350 Frequency of micturition: Secondary | ICD-10-CM

## 2015-10-15 DIAGNOSIS — R3129 Other microscopic hematuria: Secondary | ICD-10-CM

## 2015-10-15 LAB — BLADDER SCAN AMB NON-IMAGING

## 2015-10-15 MED ORDER — OXYBUTYNIN CHLORIDE ER 15 MG PO TB24: 15.0000 mg | ORAL_TABLET | Freq: Every day | ORAL | 4 refills | Status: DC

## 2015-10-15 NOTE — Progress Notes (Signed)
10/15/2015 1:56 PM   Kelly Foster April 04, 1950 [id number]  Referring provider: Hyman Hopes, MD No address on file  Chief Complaint  Patient presents with  . Urinary Frequency    65month    HPI: Patient is a 65 year old African-American female who presents today after a trial of oxybutynin for her urinary frequency and nocturia.   At her last visit, patient stated that she is experiencing frequency and urination, urinary urgency and nocturia.  She stated that during the day she is making trips to the restrooms approximately every 30 minutes.  She also states that she is getting up 2-3 times at night.  She found the Myrbetriq ineffective and wanted to start oxybutynin as she has had success with it in the past.  She states that since she has started the oxybutynin, she has seen a reduction of her urinary frequency from every 30 minutes to every hour during the day.  Her nocturia has reduced to allowing her to sleep for 4 hours prior to having to urinate.    She is not experiencing dysuria, gross hematuria or suprapubic pain. She is also not had any recent fevers, chills, nausea or vomiting.  She had recently concluded a hematuria workup this summer (2017) and no GU malignancies were noted.  Does not report any episodes of gross hematuria.   Her PVR is 0 mL.  PMH: Past Medical History:  Diagnosis Date  . Allergy   . Anxiety   . Arthritis   . Bipolar 1 disorder (HCC)   . DJD (degenerative joint disease)   . Hip pain   . Hyperlipidemia   . Hypertension   . Manic depressive disorder (HCC)   . Overweight   . Prediabetes   . Ulnar neuropathy   . Urinary frequency   . Vaginal atrophy     Surgical History: Past Surgical History:  Procedure Laterality Date  . ABDOMINAL HYSTERECTOMY    . arm surgery Left   . CESAREAN SECTION    . JOINT REPLACEMENT Left 2016   dec  . 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 Always use your most recent med list.          acetaminophen 650 MG CR tablet Commonly known as:  TYLENOL Take 650-1,300 mg by mouth every 8 (eight) hours as needed for pain. Reported on 07/06/2015   acyclovir 400 MG tablet Commonly known as:  ZOVIRAX Take 400 mg by mouth 3 (three) times daily as needed (for flares).   albuterol 108 (90 Base) MCG/ACT inhaler Commonly known as:  PROVENTIL HFA;VENTOLIN HFA Inhale 2 puffs into the lungs every 6 (six) hours as needed for wheezing or shortness of breath.   ARTIFICIAL TEARS 0.1-0.3 % Soln Administer 1 drop to both eyes 4 (four) times a day as needed.   azelastine 0.1 % nasal spray Commonly known as:  ASTELIN Place 1 spray into both nostrils 2 (two) times daily.   baclofen 10 MG tablet Commonly known as:  LIORESAL Take by mouth.   Calcium Carbonate-Vitamin D3 600-400 MG-UNIT Tabs Take by mouth.   diclofenac 50 MG EC tablet Commonly known as:  VOLTAREN Take 50 mg by mouth 2 (two) times daily as needed for mild pain.  divalproex 500 MG 24 hr tablet Commonly known as:  DEPAKOTE ER Take by mouth.   enoxaparin 40 MG/0.4ML injection Commonly known as:  LOVENOX Inject 0.4 mLs (40 mg total) into the skin daily.   fluticasone 50 MCG/ACT nasal spray Commonly known as:  FLONASE Place 2 sprays into both nostrils daily as needed for rhinitis. Reported on 07/06/2015   GOODSENSE ASPIRIN 325 MG tablet Generic drug:  aspirin Take 325 mg by mouth.   HYDROcodone-acetaminophen 5-325 MG tablet Commonly known as:  NORCO/VICODIN Take by mouth.   montelukast 10 MG tablet Commonly known as:  SINGULAIR Take 10 mg by mouth at bedtime. Reported on 04/05/2015   Olopatadine HCl 0.2 % Soln Apply to eye.   oxybutynin 15 MG 24 hr tablet Commonly known as:  DITROPAN XL Take  1 tablet (15 mg total) by mouth at bedtime.   PROCTOSOL HC 2.5 % rectal cream Generic drug:  hydrocortisone   risperidone 4 MG tablet Commonly known as:  RISPERDAL Take 4 mg by mouth at bedtime.   sertraline 100 MG tablet Commonly known as:  ZOLOFT Take 200 mg by mouth at bedtime.   simvastatin 40 MG tablet Commonly known as:  ZOCOR Take 40 mg by mouth at bedtime.   Spacer/Aero Chamber Kohl'sMouthpiece Misc by Does not apply route.   traZODone 100 MG tablet Commonly known as:  DESYREL Take 200 mg by mouth at bedtime.   triamcinolone cream 0.1 % Commonly known as:  KENALOG       Allergies:  Allergies  Allergen Reactions  . Gabapentin Other (See Comments)    tremors tremors  . Codeine Itching  . Pregabalin Other (See Comments)    Left side pain under arm, started after use of lyrica  . Tramadol Itching    Family History: Family History  Problem Relation Age of Onset  . Alzheimer's disease Mother   . Stroke Father   . Colon cancer Sister   . Kidney disease Neg Hx     Social History:  reports that she quit smoking about 20 years ago. Her smoking use included Cigarettes. She smoked 1.00 pack per day. She has never used smokeless tobacco. She reports that she does not drink alcohol or use drugs.  ROS: UROLOGY Frequent Urination?: No Hard to postpone urination?: No Burning/pain with urination?: No Get up at night to urinate?: No Leakage of urine?: No Urine stream starts and stops?: No Trouble starting stream?: No Do you have to strain to urinate?: No Blood in urine?: No Urinary tract infection?: No Sexually transmitted disease?: No Injury to kidneys or bladder?: No Painful intercourse?: No Weak stream?: No Currently pregnant?: No Vaginal bleeding?: No Last menstrual period?: n  Gastrointestinal Nausea?: No Vomiting?: No Indigestion/heartburn?: No Diarrhea?: No Constipation?: No  Constitutional Fever: No Night sweats?: No Weight loss?:  No Fatigue?: No  Skin Skin rash/lesions?: No Itching?: No  Eyes Blurred vision?: No Double vision?: No  Ears/Nose/Throat Sore throat?: No Sinus problems?: Yes  Hematologic/Lymphatic Swollen glands?: No Easy bruising?: No  Cardiovascular Leg swelling?: No Chest pain?: No  Respiratory Cough?: No Shortness of breath?: No  Endocrine Excessive thirst?: No  Musculoskeletal Back pain?: No Joint pain?: Yes  Neurological Headaches?: No Dizziness?: No  Psychologic Depression?: Yes Anxiety?: No  Physical Exam: BP 113/75   Pulse (!) 108   Ht 5\' 7"  (1.702 m)   Wt 217 lb (98.4 kg)   BMI 33.99 kg/m   Constitutional: Well nourished. Alert and oriented, No acute distress. HEENT:  [place] AT, moist mucus membranes. Trachea midline, no masses. Cardiovascular: No clubbing, cyanosis, or edema. Respiratory: Normal respiratory effort, no increased work of breathing. GI: Abdomen is soft, non tender, non distended, no abdominal masses. Liver and spleen not palpable.  No hernias appreciated.  Stool sample for occult testing is not indicated.   GU: No CVA tenderness.  No bladder fullness or masses.   Skin: No rashes, bruises or suspicious lesions. Lymph: No cervical or inguinal adenopathy. Neurologic: Grossly intact, no focal deficits, moving all 4 extremities. Psychiatric: Normal mood and affect.  Laboratory Data: Lab Results  Component Value Date   WBC 6.1 01/04/2015   HGB 9.4 (L) 01/04/2015   HCT 29.1 (L) 01/04/2015   MCV 85.3 01/04/2015   PLT 605 (H) 01/04/2015    Lab Results  Component Value Date   CREATININE 0.80 08/09/2015    Lab Results  Component Value Date   TSH 1.078 12/20/2014       Component Value Date/Time   CHOL 152 02/23/2014 1620   HDL 42 02/23/2014 1620   VLDL 34 02/23/2014 1620   LDLCALC 76 02/23/2014 1620    Lab Results  Component Value Date   AST 13 (L) 01/04/2015   Lab Results  Component Value Date   ALT 11 (L) 01/04/2015      Pertinent Imaging: Results for NORIKO, MACARI (MRN [id number]) as of 10/15/2015 13:52  Ref. Range 10/15/2015 13:49  Scan Result Unknown 0ml   Assessment & Plan:    1. Urinary frequency  - failed Myrbetriq  - continue oxybutynin ER 15 mg bid, refills given  - reminded her of the side effects, dry mouth, constipation and mental status changes  - BLADDER SCAN AMB NON-IMAGING  - RTC in one year for PVR and symptom recheck  2. Nocturia  - see above   3. Microscopic hematuria  - negative hematuria work up with CT Urogram and cystoscopy in 2017  - refer to nephrology for further evaluation pending   Return in about 1 year (around 10/14/2016) for PVR and symptom recheck.  These notes generated with voice recognition software. I apologize for typographical errors.  Michiel Cowboy, PA-C  [facility] [address] [place], [place] [address] [phone]

## 2015-10-16 ENCOUNTER — Telehealth: Payer: Self-pay | Admitting: Urology

## 2015-10-16 NOTE — Telephone Encounter (Signed)
I called today and spoke with Kelly Foster at [facility] about her referral. Kelly Foster has tried calling Kelly Foster multiple times and she has not gotten any response back. I had her go ahead and make the appointment with me for 11-20-15 @ 1:20 with Dr. Thedore MinsSingh and I will contact the patient to let her know.   Thanks,  Marcelino DusterMichelle

## 2015-10-29 ENCOUNTER — Telehealth: Payer: Self-pay | Admitting: Urology

## 2015-10-29 ENCOUNTER — Other Ambulatory Visit: Payer: Self-pay | Admitting: Urology

## 2015-10-29 DIAGNOSIS — R350 Frequency of micturition: Secondary | ICD-10-CM

## 2015-10-29 MED ORDER — OXYBUTYNIN CHLORIDE ER 15 MG PO TB24: ORAL_TABLET | ORAL | 4 refills | Status: DC

## 2015-10-29 NOTE — Telephone Encounter (Signed)
Done

## 2015-10-29 NOTE — Telephone Encounter (Signed)
Patient called the office this morning.  She has been taking 2 pills of oxybutinin per day.  She has used all of her prescription, but the pharmacy will not refill because the prescription is written for 1 per day.  She is wanting to know if you will send over a new prescription for 2 per day so that the pharmacy will refill.  [facility] [place].

## 2016-05-29 ENCOUNTER — Ambulatory Visit: Payer: Medicare HMO | Admitting: Family Medicine

## 2016-05-29 ENCOUNTER — Telehealth: Payer: Self-pay | Admitting: Internal Medicine

## 2016-05-29 DIAGNOSIS — E039 Hypothyroidism, unspecified: Secondary | ICD-10-CM | POA: Insufficient documentation

## 2016-05-29 DIAGNOSIS — G8929 Other chronic pain: Secondary | ICD-10-CM | POA: Insufficient documentation

## 2016-05-29 DIAGNOSIS — M545 Low back pain, unspecified: Secondary | ICD-10-CM | POA: Insufficient documentation

## 2016-05-29 DIAGNOSIS — F32A Depression, unspecified: Secondary | ICD-10-CM | POA: Insufficient documentation

## 2016-05-29 DIAGNOSIS — F329 Major depressive disorder, single episode, unspecified: Secondary | ICD-10-CM | POA: Insufficient documentation

## 2016-05-29 DIAGNOSIS — M5412 Radiculopathy, cervical region: Secondary | ICD-10-CM | POA: Insufficient documentation

## 2016-05-29 DIAGNOSIS — K2100 Gastro-esophageal reflux disease with esophagitis, without bleeding: Secondary | ICD-10-CM | POA: Insufficient documentation

## 2016-05-29 DIAGNOSIS — G894 Chronic pain syndrome: Secondary | ICD-10-CM | POA: Insufficient documentation

## 2016-05-29 DIAGNOSIS — M169 Osteoarthritis of hip, unspecified: Secondary | ICD-10-CM | POA: Insufficient documentation

## 2016-05-29 DIAGNOSIS — E785 Hyperlipidemia, unspecified: Secondary | ICD-10-CM | POA: Insufficient documentation

## 2016-05-29 DIAGNOSIS — M4800 Spinal stenosis, site unspecified: Secondary | ICD-10-CM | POA: Insufficient documentation

## 2016-05-29 NOTE — Telephone Encounter (Signed)
LMOM for patient to call. Roosvelt Maser, PA-C is unable to accept Ms. Boulais as a New Patient at this time.

## 2016-07-01 ENCOUNTER — Ambulatory Visit: Payer: Medicare HMO | Admitting: Family Medicine

## 2016-07-01 DIAGNOSIS — Z0289 Encounter for other administrative examinations: Secondary | ICD-10-CM

## 2016-07-02 DIAGNOSIS — M545 Low back pain: Secondary | ICD-10-CM | POA: Diagnosis not present

## 2016-07-02 DIAGNOSIS — G894 Chronic pain syndrome: Secondary | ICD-10-CM | POA: Diagnosis not present

## 2016-07-02 DIAGNOSIS — M4800 Spinal stenosis, site unspecified: Secondary | ICD-10-CM | POA: Diagnosis not present

## 2016-07-02 DIAGNOSIS — G8929 Other chronic pain: Secondary | ICD-10-CM | POA: Diagnosis not present

## 2016-07-02 DIAGNOSIS — M169 Osteoarthritis of hip, unspecified: Secondary | ICD-10-CM | POA: Diagnosis not present

## 2016-07-02 DIAGNOSIS — M25559 Pain in unspecified hip: Secondary | ICD-10-CM | POA: Diagnosis not present

## 2016-07-08 DIAGNOSIS — L539 Erythematous condition, unspecified: Secondary | ICD-10-CM | POA: Diagnosis not present

## 2016-07-08 DIAGNOSIS — F319 Bipolar disorder, unspecified: Secondary | ICD-10-CM | POA: Diagnosis not present

## 2016-07-08 DIAGNOSIS — K1379 Other lesions of oral mucosa: Secondary | ICD-10-CM | POA: Diagnosis not present

## 2016-07-08 DIAGNOSIS — E785 Hyperlipidemia, unspecified: Secondary | ICD-10-CM | POA: Diagnosis not present

## 2016-07-08 DIAGNOSIS — E2740 Unspecified adrenocortical insufficiency: Secondary | ICD-10-CM | POA: Diagnosis not present

## 2016-07-08 DIAGNOSIS — K120 Recurrent oral aphthae: Secondary | ICD-10-CM | POA: Diagnosis not present

## 2016-07-08 DIAGNOSIS — M5412 Radiculopathy, cervical region: Secondary | ICD-10-CM | POA: Diagnosis not present

## 2016-07-08 DIAGNOSIS — Z87891 Personal history of nicotine dependence: Secondary | ICD-10-CM | POA: Diagnosis not present

## 2016-07-08 DIAGNOSIS — B370 Candidal stomatitis: Secondary | ICD-10-CM | POA: Diagnosis not present

## 2016-07-08 DIAGNOSIS — Z96649 Presence of unspecified artificial hip joint: Secondary | ICD-10-CM | POA: Diagnosis not present

## 2016-07-08 DIAGNOSIS — M1990 Unspecified osteoarthritis, unspecified site: Secondary | ICD-10-CM | POA: Diagnosis not present

## 2016-07-08 DIAGNOSIS — E039 Hypothyroidism, unspecified: Secondary | ICD-10-CM | POA: Diagnosis not present

## 2016-07-11 ENCOUNTER — Ambulatory Visit (INDEPENDENT_AMBULATORY_CARE_PROVIDER_SITE_OTHER): Payer: Medicare HMO | Admitting: Nurse Practitioner

## 2016-07-11 ENCOUNTER — Other Ambulatory Visit: Payer: Self-pay | Admitting: Nurse Practitioner

## 2016-07-11 ENCOUNTER — Encounter: Payer: Self-pay | Admitting: Nurse Practitioner

## 2016-07-11 VITALS — BP 123/66 | HR 98 | Temp 98.4°F | Ht 67.0 in | Wt 186.8 lb

## 2016-07-11 DIAGNOSIS — B370 Candidal stomatitis: Secondary | ICD-10-CM

## 2016-07-11 DIAGNOSIS — Z7689 Persons encountering health services in other specified circumstances: Secondary | ICD-10-CM | POA: Diagnosis not present

## 2016-07-11 DIAGNOSIS — K649 Unspecified hemorrhoids: Secondary | ICD-10-CM

## 2016-07-11 DIAGNOSIS — K644 Residual hemorrhoidal skin tags: Secondary | ICD-10-CM | POA: Diagnosis not present

## 2016-07-11 DIAGNOSIS — E441 Mild protein-calorie malnutrition: Secondary | ICD-10-CM | POA: Diagnosis not present

## 2016-07-11 HISTORY — DX: Unspecified hemorrhoids: K64.9

## 2016-07-11 MED ORDER — TRIAMCINOLONE ACETONIDE 0.1 % EX CREA: TOPICAL_CREAM | Freq: Two times a day (BID) | CUTANEOUS | 0 refills | Status: DC

## 2016-07-11 MED ORDER — NYSTATIN 100000 UNIT/ML MT SUSP: 5.0000 mL | Freq: Four times a day (QID) | OROMUCOSAL | 0 refills | Status: DC

## 2016-07-11 MED ORDER — UNJURY VANILLA POWDER: 2.0000 [oz_av] | Freq: Three times a day (TID) | ORAL | 0 refills | Status: DC

## 2016-07-11 MED ORDER — OLOPATADINE HCL 0.2 % OP SOLN: 1.0000 [drp] | Freq: Every day | OPHTHALMIC | 1 refills | Status: DC

## 2016-07-11 NOTE — Patient Instructions (Addendum)
Kelly Foster, Thank you for coming in to clinic today.  1. Please continue your nystatin four times per day for 2 weeks.  IF you still have sore mouth after 2 weeks, please come back to the clinic.  - Increase your protein intake to try to stop losing weight.  I have a prescription for you to take to try to get from the pharmacy.   Please schedule a follow-up appointment with Wilhelmina Mcardle, AGNP to Return in about 2 months (around 09/10/2016) for annual physical.  If you have any other questions or concerns, please feel free to call the clinic or send a message through MyChart. You may also schedule an earlier appointment if necessary.  Wilhelmina Mcardle, DNP, AGNP-BC Adult Gerontology Nurse Practitioner [facility], [facility]   Oral [name], Adult Oral thrush, also called oral candidiasis, is a fungal infection that develops in the mouth and throat and on the tongue. It causes white patches to form on the mouth and tongue. [name] is most common in older adults, but it can occur at any age. Many cases of thrush are mild, but this infection can also be serious. [name] can be a repeated (recurrent) problem for certain people who have a weak body defense system (immune system). The weakness can be caused by chronic illnesses, or by taking medicines that limit the body's ability to fight infection. If a person has difficulty fighting infection, the fungus that causes thrush can spread through the body. This can cause life-threatening blood or organ infections. What are the causes? This condition is caused by a fungus (yeast) called Candida albicans.  This fungus is normally present in small amounts in the mouth and on other mucous membranes. It usually causes no harm.  If conditions are present that allow the fungus to grow without control, it invades surrounding tissues and becomes an infection.  Other Candida species can also lead to thrush (rare).  What increases the risk? This condition  is more likely to develop in:  People with a weakened immune system.  Older adults.  People with HIV (human immunodeficiency virus).  People with diabetes.  People with dry mouth (xerostomia).  Pregnant women.  People with poor dental care, especially people who have false teeth.  People who use antibiotic medicines.  What are the signs or symptoms? Symptoms of this condition can vary from mild and moderate to severe and persistent. Symptoms may include:  A burning feeling in the mouth and throat. This can occur at the start of a thrush infection.  White patches that stick to the mouth and tongue. The tissue around the patches may be red, raw, and painful. If rubbed (during tooth brushing, for example), the patches and the tissue of the mouth may bleed easily.  A bad taste in the mouth or difficulty tasting foods.  A cottony feeling in the mouth.  Pain during eating and swallowing.  Poor appetite.  Cracking at the corners of the mouth.  How is this diagnosed? This condition is diagnosed based on:  Physical exam. Your health care provider will look in your mouth.  Health history. Your health care provider will ask you questions about your health.  How is this treated? This condition is treated with medicines called antifungals, which prevent the growth of fungi. These medicines are either applied directly to the affected area (topical) or swallowed (oral). The treatment will depend on the severity of the condition. Mild thrush Mild cases of thrush may clear up with the use  of an antifungal mouth rinse or lozenges. Treatment usually lasts about 14 days. Moderate to severe thrush  More severe thrush infections that have spread to the esophagus are treated with an oral antifungal medicine. A topical antifungal medicine may also be used.  For some severe infections, treatment may need to continue for more than 14 days.  Oral antifungal medicines are rarely used during  pregnancy because they may be harmful to the unborn child. If you are pregnant, talk with your health care provider about options for treatment. Persistent or recurrent thrush For cases of thrush that do not go away or keep coming back:  Treatment may be needed twice as long as the symptoms last.  Treatment will include both oral and topical antifungal medicines.  People with a weakened immune system can take an antifungal medicine on a continuous basis to prevent thrush infections.  It is important to treat conditions that make a person more likely to get thrush, such as diabetes or HIV. Follow these instructions at home: Medicines  Take over-the-counter and prescription medicines only as told by your health care provider.  Talk with your health care provider about an over-the-counter medicine called gentian violet, which kills bacteria and fungi. Relieving soreness and discomfort To help reduce the discomfort of thrush:  Drink cold liquids such as water or iced tea.  Try flavored ice treats or frozen juices.  Eat foods that are easy to swallow, such as gelatin, ice cream, or custard.  Try drinking from a straw if the patches in your mouth are painful.  General instructions  Eat plain, unflavored yogurt as directed by your health care provider. Check the label to make sure the yogurt contains live cultures. This yogurt can help healthy bacteria to grow in the mouth and can stop the growth of the fungus that causes thrush.  If you wear dentures, remove the dentures before going to bed, brush them vigorously, and soak them in a cleaning solution as directed by your health care provider.  Rinse your mouth with a warm salt-water mixture several times a day. To make a salt-water mixture, completely dissolve 1/2-1 tsp of salt in 1 cup of warm water. Contact a health care provider if:  Your symptoms are getting worse or are not improving within 7 days of starting treatment.  You have  symptoms of a spreading infection, such as white patches on the skin outside of the mouth. This information is not intended to replace advice given to you by your health care provider. Make sure you discuss any questions you have with your health care provider. Document Released: 10/16/2003 Document Revised: 10/15/2015 Document Reviewed: 10/15/2015 Elsevier Interactive Patient Education  2017 [employer]

## 2016-07-11 NOTE — Progress Notes (Signed)
Subjective:    Patient ID: Kelly Foster, female    DOB: 05-29-1950, 66 y.o.   MRN: [id number]  Kelly Foster is a 66 y.o. female presenting on 07/11/2016 for Thrush (mouth and throat x 1 week. She was seen at [facility] ER and diagnosed w/ thrush Treated w/ Nystatin Oral rinse )   HPI  Establish Care New Provider Pt last seen by PCP Dr. Lawerance Bach.  Obtain records from [facility].   Thrush Sores in mouth w/ infected teeth.  Had 6 teeth pulled 3/21 - sores appeared about 1 month after dental work at end of April.  After teeth pulled, used chlorhexidine and magic mouthwash x 2 months.  Off for about 4 weeks before going to ED at [facility] Sunday -w/ thrush and nausea.  She was dehydrated on Sunday at ED w/ BP 70s.  She is only eating jello and oatmeal r/t pain w/ swallowing.  Did eat squash today for lunch. She has had a few smoothies also.  Eating full meal for first time yesterday.  Now has had 2 meals today.  Mental Health - Bipolar 1 disorder, depression, adynamia Amitriptyline and depakote w/ follow up at [facility] every 3 months  Herbert Seta is therapist.  Dr. Rogers Blocker is psychiatrist.  Adrenal Insufficiency Chart Review shows history of adrenal insufficiency.  Pt has not had follow up for adrenal insufficiency w/ endocrinology and is unsure of this condition.  Chronic pain syndrome Pt is managed by pain clinic for her chronic back and hip pain.  Hypothyroidism Pt without medication for thyroid disease   Past Medical History:  Diagnosis Date  . Adrenal insufficiency (HCC)   . Allergy   . Anxiety   . Arthritis   . Bipolar 1 disorder (HCC)   . Central spinal stenosis   . Cervical radiculopathy, chronic   . Chronic low back pain without sciatica   . Chronic pain syndrome   . Depression   . Disorder of left sacroiliac joint   . DJD (degenerative joint disease)   . Elevated CK   . Hip pain   . History of kidney stones   . Hyperlipidemia   . Hypertension   .  Hyponatremia   . Hypothyroidism   . Lower urinary tract infection 06/06/2013   Overview:  IMO Problem List Replacer Jan. 2016   . Lumbar radiculitis   . Manic depressive disorder (HCC)   . OA (osteoarthritis) of hip   . Overweight   . Prediabetes   . Reflux esophagitis   . Sacrococcygeal disorders, not elsewhere classified   . Trochanteric bursitis of left hip   . Ulnar neuropathy   . Urinary frequency   . Vaginal atrophy    Past Surgical History:  Procedure Laterality Date  . ABDOMINAL HYSTERECTOMY    . arm surgery Left   . CESAREAN SECTION    . JOINT REPLACEMENT Left 2016   dec  . TOTAL HIP ARTHROPLASTY Left 12/26/2014   Procedure: TOTAL HIP ARTHROPLASTY ANTERIOR APPROACH;  Surgeon:  Bucker, MD;  Location: [facility] ORS;  Service: Orthopedics;  Laterality: Left;   Social History   Social History  . Marital status: Single    Spouse name: N/A  . Number of children: N/A  . Years of education: N/A   Occupational History  . disabled    Social History Main Topics  . Smoking status: Former Smoker    Packs/day: 1.00    Types: Cigarettes    Quit date: 03/01/1995  .  Smokeless tobacco: Never Used     Comment: quit 1998  . Alcohol use No  . Drug use: No     Comment: Former hx of Crack Cocaine quit 1998  . Sexual activity: Not on file   Other Topics Concern  . Not on file   Social History Narrative  . No narrative on file   Family History  Problem Relation Age of Onset  . Alzheimer's disease Mother   . Arthritis Mother   . Stroke Father   . Hypertension Father   . Colon cancer Sister   . Hypertension Sister   . Kidney disease Neg Hx    Current Outpatient Prescriptions on File Prior to Visit  Medication Sig  . acyclovir (ZOVIRAX) 400 MG tablet Take 400 mg by mouth 3 (three) times daily as needed (for flares).  Marland Kitchen. albuterol (PROVENTIL HFA;VENTOLIN HFA) 108 (90 BASE) MCG/ACT inhaler Inhale 2 puffs into the lungs every 6 (six) hours as needed for wheezing or shortness  of breath.  . ARTIFICIAL TEARS 0.1-0.3 % SOLN Administer 1 drop to both eyes 4 (four) times a day as needed.  Marland Kitchen. aspirin (GOODSENSE ASPIRIN) 325 MG tablet Take 325 mg by mouth.  Marland Kitchen. azelastine (ASTELIN) 0.1 % nasal spray Place 1 spray into both nostrils 2 (two) times daily.  . Calcium Carbonate-Vitamin D3 600-400 MG-UNIT TABS Take by mouth.  . divalproex (DEPAKOTE ER) 500 MG 24 hr tablet Take by mouth.  . fluticasone (FLONASE) 50 MCG/ACT nasal spray Place 2 sprays into both nostrils daily as needed for rhinitis. Reported on 07/06/2015  . HYDROcodone-acetaminophen (NORCO/VICODIN) 5-325 MG tablet Take by mouth.  . hydrocortisone (PROCTOSOL HC) 2.5 % rectal cream   . montelukast (SINGULAIR) 10 MG tablet Take 10 mg by mouth at bedtime. Reported on 04/05/2015  . oxybutynin (DITROPAN XL) 15 MG 24 hr tablet Take one in the am and one in the pm  . sertraline (ZOLOFT) 100 MG tablet Take 200 mg by mouth daily.   . simvastatin (ZOCOR) 40 MG tablet Take 40 mg by mouth at bedtime.   Marland Kitchen. Spacer/Aero Chamber Mouthpiece MISC by Does not apply route.   No current facility-administered medications on file prior to visit.     Review of Systems  Constitutional: Negative.   HENT: Positive for sore throat.   Eyes: Positive for itching.  Respiratory: Negative.   Cardiovascular: Negative.   Gastrointestinal: Negative.   Endocrine: Negative.   Genitourinary: Negative.   Musculoskeletal: Positive for arthralgias and back pain.  Skin: Negative.   Allergic/Immunologic: Negative.   Neurological: Positive for tremors.  Psychiatric/Behavioral: Positive for dysphoric mood. Negative for suicidal ideas.   Per HPI unless specifically indicated above      Objective:    BP 123/66 (BP Location: Right Arm, Patient Position: Sitting, Cuff Size: Large)   Pulse 98   Temp 98.4 F (36.9 C) (Oral)   Ht 5\' 7"  (1.702 m)   Wt 186 lb 12.8 oz (84.7 kg)   BMI 29.26 kg/m   Wt Readings from Last 3 Encounters:  07/11/16 186 lb  12.8 oz (84.7 kg)  10/15/15 217 lb (98.4 kg)  09/17/15 221 lb 1.6 oz (100.3 kg)    Physical Exam  Constitutional: She is oriented to person, place, and time. She appears well-developed and well-nourished.  HENT:  Head: Normocephalic and atraumatic.  Right Ear: Tympanic membrane, external ear and ear canal normal.  Left Ear: Tympanic membrane, external ear and ear canal normal.  Mouth/Throat: Uvula is midline. Mucous  membranes are dry. Normal dentition. Posterior oropharyngeal erythema present. No posterior oropharyngeal edema or tonsillar abscesses.  Tongue covered w/ white/pink patches.  Underlying mucous membranes erythematous Pt without teeth and no dentures.   No lesions of buccal mucosa or gums.  Eyes: Conjunctivae and EOM are normal. Pupils are equal, round, and reactive to light.  Neck: Normal range of motion. Neck supple. No JVD present. No tracheal deviation present.  Cardiovascular: Normal rate, regular rhythm, normal heart sounds and intact distal pulses.   Pulmonary/Chest: Effort normal and breath sounds normal. No respiratory distress.  Abdominal: Soft. Bowel sounds are normal. She exhibits no distension and no mass. There is no tenderness.  Lymphadenopathy:    She has no cervical adenopathy.  Neurological: She is alert and oriented to person, place, and time.  Skin: Skin is warm and dry.  Psychiatric: She has a normal mood and affect. Judgment and thought content normal. Her speech is slurred. Cognition and memory are normal. She expresses no homicidal and no suicidal ideation.  EPS symptoms with lip smacking, jaw twitching She is inattentive.  Vitals reviewed.  Results for orders placed or performed in visit on 10/15/15  BLADDER SCAN AMB NON-IMAGING  Result Value Ref Range   Scan Result 0ml       Assessment & Plan:   Problem List Items Addressed This Visit     Other Visit Diagnoses    Oral candidiasis    -  Primary Pt with improvement in oral thrush after use of  nystatin x 2 weeks.  Pt now out of nystatin and persistent tongue lesions and sore throat.  Plan: 1. Continue nystatin x 2 weeks.  If recurrence or non-resolution, may need oral course of diflucan. 2. Consider HIV screening in future as well. 3. Encourage oral intake as tolerated w/ advancing meals. 4. Follow up as needed.   Relevant Medications   nystatin (MYCOSTATIN) 100000 UNIT/ML suspension   Mild protein-calorie malnutrition (HCC)     Pt w/ weight loss and malnutrition w/ several months of poor oral intake.  No teeth so all food must be soft and protein intake is limited.  Plan: 1. Start protein powder for smoothies and to mix into yogurt, oatmeal, and other foods. 2. Encouraged pt to seek out soft foods and to chop meats prior to serving. 3. Consider nutrition visit in future.   Relevant Medications   protein supplement (UNJURY VANILLA) POWD   Encounter to establish care     Pt w/ need to follow up for chronic conditions in future.  Check thyroid, adrenal function, and consider specialist referrals as needed.  Follow up for annual physical in 2 months.      Meds ordered this encounter  Medications  . Multiple Vitamin (MULTIVITAMIN) tablet    Sig: Take 1 tablet by mouth daily.  Marland Kitchen amitriptyline (ELAVIL) 50 MG tablet    Sig: Take 50 mg by mouth at bedtime.  Marland Kitchen nystatin (MYCOSTATIN) 100000 UNIT/ML suspension    Sig: Take 5 mLs (500,000 Units total) by mouth 4 (four) times daily. Swish, gargle, and swallow    Dispense:  280 mL    Refill:  0    Order Specific Question:   Supervising Provider    Answer:   Smitty Cords [[id number]]  . protein supplement (UNJURY VANILLA) POWD    Sig: Take 7 g (2 oz total) by mouth 3 (three) times daily.    Dispense:  600 g    Refill:  0  Product selection permitted    Order Specific Question:   Supervising Provider    Answer:   Smitty Cords [[id number]]  . Olopatadine HCl 0.2 % SOLN    Sig: Place 1 drop into both eyes daily.     Dispense:  3 Bottle    Refill:  1    Order Specific Question:   Supervising Provider    Answer:   Smitty Cords [[id number]]  . triamcinolone cream (KENALOG) 0.1 %    Sig: Apply topically 2 (two) times daily.    Dispense:  30 g    Refill:  0    Order Specific Question:   Supervising Provider    Answer:   Smitty Cords [[id number]]      Follow up plan: Return in about 2 months (around 09/10/2016) for annual physical.  Wilhelmina Mcardle, DNP, AGPCNP-BC Adult Gerontology Primary Care Nurse Practitioner [facility] 07/15/2016, 2:14 PM

## 2016-07-14 MED ORDER — OLOPATADINE HCL 0.2 % OP SOLN: 1.0000 [drp] | Freq: Every day | OPHTHALMIC | 1 refills | Status: DC

## 2016-07-14 MED ORDER — NYSTATIN 100000 UNIT/ML MT SUSP: 5.0000 mL | Freq: Four times a day (QID) | OROMUCOSAL | 0 refills | Status: DC

## 2016-07-14 MED ORDER — UNJURY VANILLA POWDER: 2.0000 [oz_av] | Freq: Three times a day (TID) | ORAL | 0 refills | Status: DC

## 2016-07-14 MED ORDER — TRIAMCINOLONE ACETONIDE 0.1 % EX CREA: TOPICAL_CREAM | Freq: Two times a day (BID) | CUTANEOUS | 0 refills | Status: DC

## 2016-07-15 NOTE — Progress Notes (Signed)
I have reviewed this encounter including the documentation in this note and/or discussed this patient with the provider, Wilhelmina McardleLauren Kennedy, AGPCNP-BC. I am certifying that I agree with the content of this note as supervising physician.  Saralyn PilarAlexander Keyaan Lederman, DO [facility] 07/15/2016, 5:23 PM

## 2016-07-21 ENCOUNTER — Telehealth: Payer: Self-pay | Admitting: Nurse Practitioner

## 2016-07-21 NOTE — Telephone Encounter (Signed)
Pt. Called request a refill on  Magic  Mouthwash. Pt. Call back #is  [phone]

## 2016-07-22 ENCOUNTER — Telehealth: Payer: Self-pay | Admitting: Nurse Practitioner

## 2016-07-22 NOTE — Telephone Encounter (Signed)
If patient is still having difficulty with her mouth sores, she may need to be seen again.  I would prefer she call her oral surgeon to discuss this if possible.  Otherwise, will be happy to see again in clinic.  If having trouble with increased itchy eyes, we will need to see again in clinic.  I didn't do detailed eye exam at her establish care visit-only a refill for maintenance medications.

## 2016-07-22 NOTE — Telephone Encounter (Signed)
Attempted to contact the pt, no answer. LMOM to return my call.  

## 2016-07-22 NOTE — Telephone Encounter (Signed)
Pt said she had sores in her mouth that were hurting and [facility] [place]  She also said her eyes were itching and what she had been using is not working.  Please call [phone]

## 2016-07-23 ENCOUNTER — Ambulatory Visit (INDEPENDENT_AMBULATORY_CARE_PROVIDER_SITE_OTHER): Payer: Medicare HMO | Admitting: Nurse Practitioner

## 2016-07-23 VITALS — BP 132/76 | HR 108 | Temp 98.7°F | Resp 16 | Ht 67.0 in | Wt 192.0 lb

## 2016-07-23 DIAGNOSIS — E441 Mild protein-calorie malnutrition: Secondary | ICD-10-CM

## 2016-07-23 DIAGNOSIS — Z9189 Other specified personal risk factors, not elsewhere classified: Secondary | ICD-10-CM

## 2016-07-23 DIAGNOSIS — H1045 Other chronic allergic conjunctivitis: Secondary | ICD-10-CM | POA: Diagnosis not present

## 2016-07-23 MED ORDER — UNJURY VANILLA POWDER: 2.0000 [oz_av] | Freq: Every day | ORAL | 0 refills | Status: AC

## 2016-07-23 MED ORDER — ASPIRIN 81 MG PO TABS: 81.0000 mg | ORAL_TABLET | Freq: Every day | ORAL | 0 refills | Status: AC

## 2016-07-23 MED ORDER — MONTELUKAST SODIUM 10 MG PO TABS: 10.0000 mg | ORAL_TABLET | Freq: Every day | ORAL | 0 refills | Status: DC

## 2016-07-23 NOTE — Progress Notes (Signed)
Subjective:    Patient ID: Kelly Foster, female    DOB: 12/02/1950, 66 y.o.   MRN: [id number]  Kelly Foster is a 66 y.o. female presenting on 07/23/2016 for Mouth Lesions and Itchy Eye   HPI Sores in mouth Does not desire to further talk about mouth/thrush because is improved. She is able to eat and does not have burning in her mouth or throat.  She is here for complaint of burning in mouth w/ sore in front of her mouth.  She indicates a white patch on lower lip mucous membrane.   Eye Itchiness Pt states Olopatanol is not helping her eyes.  She has continued to need them at least twice per day.  She states she stopped taking her allergy pill about 3 weeks ago and now her eye allergies/sinuses are worse.  She is currently having itchy eyes, sinus congestion, rhinorrhea.  Denies sneezing, itchy throat,   Medication Review: Acyclovir - genital herpes Albuterol - pt takes 2 puffs twice daily even without shortness of breath. Pt states she had breathing test at [facility], but there is no spirometry record in [facility].  No problem list diagnosis of asthma or COPD. Azelastine nasal spray - uses twice daily depakote - D/C by neurology in 11/29/2013 - unsure of reason or timeline for restart Flonase - stopped at [facility] after thrush in May   Vicodin - Chronic pain - Prescribed by pain clinic Hydrocortisone rectal cream - fissure Montelukast - not taking Nystatin - still taking, but no current symptoms.  STOP. Olopatadine - taking more than once daily Oxybutynin XL twice daily - prescribed for bladder spasm by urology Protein powder- has been using this.  Weight gain since last visit w/ increased oral intake.  Only continue x 2 more weeks. Sertraline - for bipolar disorder prescribed by psychiatry Simvastatin - taking once daily hyperlipidemia Triamcinolone cream - uses for rash on skin.  No rash present today.    Social History  Substance Use Topics  . Smoking status: Former Smoker    Packs/day: 1.00    Types: Cigarettes    Quit date: 03/01/1995  . Smokeless tobacco: Never Used     Comment: quit 1998  . Alcohol use No    Review of Systems Per HPI unless specifically indicated above     Objective:    BP 132/76   Pulse (!) 108   Temp 98.7 F (37.1 C) (Oral)   Resp 16   Ht 5\' 7"  (1.702 m)   Wt 192 lb (87.1 kg)   SpO2 100%   BMI 30.07 kg/m    Wt Readings from Last 3 Encounters:  07/23/16 192 lb (87.1 kg)  07/11/16 186 lb 12.8 oz (84.7 kg)  10/15/15 217 lb (98.4 kg)    Physical Exam  Constitutional: She is oriented to person, place, and time. She appears well-developed and well-nourished. She appears lethargic. No distress.  HENT:  Head: Normocephalic and atraumatic.  Right Ear: Hearing, tympanic membrane, external ear and ear canal normal.  Left Ear: Hearing, tympanic membrane, external ear and ear canal normal.  Nose: Nose normal. Right sinus exhibits no maxillary sinus tenderness and no frontal sinus tenderness. Left sinus exhibits no maxillary sinus tenderness and no frontal sinus tenderness.  Pt w/o teeth.  Single Canker sore on left lower inner lip.  Tongue erythematous w/o lesions or geographic characteristics.  Eyes: Conjunctivae, EOM and lids are normal. Pupils are equal, round, and reactive to light. Lids are everted and swept,  no foreign bodies found.  Neck: Normal range of motion. Neck supple. No JVD present. No tracheal deviation present. No thyromegaly present.  Cardiovascular: Normal rate, regular rhythm, normal heart sounds and intact distal pulses.   No murmur heard. Pulmonary/Chest: Effort normal and breath sounds normal. No respiratory distress.  Abdominal: Soft. Bowel sounds are normal. She exhibits no distension.  Lymphadenopathy:    She has no cervical adenopathy.  Neurological: She is oriented to person, place, and time. She appears lethargic.  Skin: Skin is warm and dry.  Psychiatric: She has a normal mood and affect. Her speech  is normal and behavior is normal. Thought content normal. She expresses inappropriate judgment. She exhibits abnormal recent memory.       Assessment & Plan:   Problem List Items Addressed This Visit    None    Visit Diagnoses    Medication knowledge deficit Pt with old med list in clinic.  No knowledge of what was discussed at visit 2 weeks prior.  Pt states she takes albuterol 2 puffs twice daily every day.  Plan: 1. Educated pt about albuterol as rescue inhaler. Repeated instructions at end of visit.  Pt verbalizes appropriate dosing indications and timing.  Reviewed 6-8 times before patient verbalized understanding not immediately after instructing proper administration. 2. Home health referral for medication review.  Ensure pt only taking active medications on current med list from this visit. 3. Evaluate Depakote rx in chart after visit.  Advise after chart review and reduce slowly if not needed.    Other chronic allergic conjunctivitis of both eyes    -  Primary No active conjunctivitis noted.  Pt is taking olopatadine daily - more frequently than prescribed.  She has stopped taking montelukast and is likely source of pt's symptoms.  Plan: 1. Restart montelukast 10 mg once daily. 2. Instructed pt to only use olopatadine once daily. 3. Follow up in 2-3 weeks if not improved.   Relevant Medications   montelukast (SINGULAIR) 10 MG tablet   Other Relevant Orders   Ambulatory referral to Home Health   Mild protein-calorie malnutrition (HCC)     Improving w/ weight gain since last visit w/ reduction of mouth sores and increased nutrition.  Plan: 1. Home health referral assess in home ability to prepare meals.   2. Reduce protein supplement to once daily. 3. Follow up as needed and in August.    Relevant Medications   protein supplement Jerrilyn Cairo) POWD   Other Relevant Orders   Ambulatory referral to Home Health      Meds ordered this encounter  Medications  .  montelukast (SINGULAIR) 10 MG tablet    Sig: Take 1 tablet (10 mg total) by mouth at bedtime. Reported on 04/05/2015    Dispense:  60 tablet    Refill:  0  . aspirin 81 MG tablet    Sig: Take 1 tablet (81 mg total) by mouth daily.    Dispense:  30 tablet    Refill:  0  . protein supplement (UNJURY VANILLA) POWD    Sig: Take 7 g (2 oz total) by mouth daily.    Dispense:  600 g    Refill:  0    Product selection permitted  . DISCONTD: fluticasone (FLONASE) 50 MCG/ACT nasal spray    Sig: Place 1 spray into both nostrils as needed.   . bisacodyl (DULCOLAX) 5 MG EC tablet    Sig: Take 5 mg by mouth at bedtime as needed for moderate  constipation. Patient is taking 2 tabs.      Follow up plan: Return for your annual physical in August.  A total of 40 minutes was spent face-to-face with this patient. Greater than 50% of this time was spent in counseling and coordination of care with the patient. Discussed all medications w/ pt.  Reviewed indications for all meds and dosing.  Reviewed montelukast and importance for continuing to reduce eye symptoms.  Reassured son about care and meds.  Wilhelmina McardleLauren Shaundra Fullam, DNP, AGPCNP-BC Adult Gerontology Primary Care Nurse Practitioner [facility] 07/28/2016, 11:32 AM

## 2016-07-23 NOTE — Patient Instructions (Addendum)
Philana, Thank you for coming in to clinic today.  1. For your eye allergies: - USE your olopatadine one drop in each eye morning. - CONTINUE your montelukast 5 mg daily.   2. For your mouth sores:  - These are Canker sores.  They will go away over time.  You do not have an infection.  This is caused by a virus and will go away without treatment. - USE orajel if your sores are painful.   3. For your albuterol:  - ONLY TAKE THIS IF YOU ARE HAVING TROUBLE BREATHING.  You probably won't need it every day.   Please schedule a follow-up appointment with Wilhelmina McardleLauren Blondie Riggsbee, AGNP to Return for your annual physical in August.  If you have any other questions or concerns, please feel free to call the clinic or send a message through MyChart. You may also schedule an earlier appointment if necessary.  Wilhelmina McardleLauren Lundynn Cohoon, DNP, AGNP-BC Adult Gerontology Nurse Practitioner [facility]

## 2016-07-23 NOTE — Telephone Encounter (Signed)
Advised patient to schedule an appointment

## 2016-07-24 ENCOUNTER — Other Ambulatory Visit: Payer: Self-pay

## 2016-07-24 DIAGNOSIS — K120 Recurrent oral aphthae: Secondary | ICD-10-CM | POA: Diagnosis not present

## 2016-07-24 DIAGNOSIS — J302 Other seasonal allergic rhinitis: Secondary | ICD-10-CM | POA: Diagnosis not present

## 2016-07-24 DIAGNOSIS — A6009 Herpesviral infection of other urogenital tract: Secondary | ICD-10-CM | POA: Diagnosis not present

## 2016-07-24 DIAGNOSIS — E441 Mild protein-calorie malnutrition: Secondary | ICD-10-CM | POA: Diagnosis not present

## 2016-07-24 DIAGNOSIS — G8929 Other chronic pain: Secondary | ICD-10-CM | POA: Diagnosis not present

## 2016-07-24 DIAGNOSIS — Z87891 Personal history of nicotine dependence: Secondary | ICD-10-CM | POA: Diagnosis not present

## 2016-07-24 NOTE — Telephone Encounter (Signed)
Brayton CavesJessie from [facility] called to add 5 diff medication on pt's med list which are triamcinolone cream, Flonase,Azelastine nasal spray, nystatin mouth wash and dulcolax but as per Leotis ShamesLauren pt should not be taking all this med's except Dulcolax it was specifically D/C by provider during her visit.

## 2016-07-25 ENCOUNTER — Telehealth: Payer: Self-pay | Admitting: Nurse Practitioner

## 2016-07-25 DIAGNOSIS — B370 Candidal stomatitis: Secondary | ICD-10-CM

## 2016-07-25 DIAGNOSIS — B3781 Candidal esophagitis: Secondary | ICD-10-CM

## 2016-07-25 MED ORDER — FLUCONAZOLE 200 MG PO TABS: 200.0000 mg | ORAL_TABLET | Freq: Every day | ORAL | 0 refills | Status: AC

## 2016-07-25 NOTE — Telephone Encounter (Signed)
Pt with return of white patches over tongue, pain with swallowing, and burning of food down esophagus.  Pt has been continuing to take nystatin despite instructions to stop on 07/23/2016.  Pt has had resolution of symptoms, now w/ recurrence.  At prior visit on 07/23/2016, pt did not have active thrush or symptoms described above.  Plan: Start diflucan 400 mg once tonight.  Then, take 200 mg daily for 21 days.  Proceed w/ ENT referral.  Obtain labs in 1-2 weeks for CMP evaluate liver function and HIV status.

## 2016-07-28 ENCOUNTER — Encounter: Payer: Self-pay | Admitting: Nurse Practitioner

## 2016-07-28 DIAGNOSIS — G8929 Other chronic pain: Secondary | ICD-10-CM | POA: Diagnosis not present

## 2016-07-28 DIAGNOSIS — K120 Recurrent oral aphthae: Secondary | ICD-10-CM | POA: Diagnosis not present

## 2016-07-28 DIAGNOSIS — Z87891 Personal history of nicotine dependence: Secondary | ICD-10-CM | POA: Diagnosis not present

## 2016-07-28 DIAGNOSIS — J302 Other seasonal allergic rhinitis: Secondary | ICD-10-CM | POA: Diagnosis not present

## 2016-07-28 DIAGNOSIS — E441 Mild protein-calorie malnutrition: Secondary | ICD-10-CM | POA: Diagnosis not present

## 2016-07-28 DIAGNOSIS — A6009 Herpesviral infection of other urogenital tract: Secondary | ICD-10-CM | POA: Diagnosis not present

## 2016-07-28 NOTE — Telephone Encounter (Signed)
Attempted to contact the pt, no answer. LMOM to return my call.  

## 2016-07-28 NOTE — Progress Notes (Signed)
I have reviewed this encounter including the documentation in this note and/or discussed this patient with the provider, Wilhelmina McardleLauren Kennedy, AGPCNP-BC. I am certifying that I agree with the content of this note as supervising physician.  Saralyn PilarAlexander Elwyn Klosinski, DO [facility] 07/28/2016, 1:45 PM

## 2016-07-29 ENCOUNTER — Telehealth: Payer: Self-pay | Admitting: Nurse Practitioner

## 2016-07-29 NOTE — Telephone Encounter (Signed)
Reviewed chart, agree with prior plan from Kelly Foster, AGPCNP-BC. She has ordered labs including Chemistry and HIV screening that patient should schedule to have drawn. And she was referred to ENT as well for this problem.  Patient has tried all of the main treatments for oral thrush / candidiasis, with oral nystatin / magic mouthwash and then now oral diflucan pills.  I do not have other recommendations at this time, will await lab tests and follow-up further recommendations from ENT.  Regarding current concern, that she had nausea and vomiting on Fluconazole. If her symptoms have resolved then that would be fine to stop the medicine and follow-up as planned and would not need any additional medications at this time.  Kelly PilarAlexander Rayelynn Loyal, DO [facility] 07/29/2016, 5:54 PM

## 2016-07-29 NOTE — Telephone Encounter (Signed)
Pt said she was not able to keep the fluconazole down.  Her call back number is [phone]

## 2016-07-29 NOTE — Telephone Encounter (Signed)
  Previous message stated that the patient called complaining that she cannot keep the Fluconazole down. I called and spoke w/ the pt and she informed me that she took the Fluconazole for three days and vomited admittedly after taking them. I informed her to discontinue the medication. The pt states her sore throat have improved and the white patches are no longer present. Please advise

## 2016-07-30 NOTE — Telephone Encounter (Signed)
I spoke w/ pt and lab appt was scheduled.

## 2016-07-30 NOTE — Telephone Encounter (Signed)
Attempted to contact the pt, no answer. LMOM to return my call.  

## 2016-07-31 ENCOUNTER — Other Ambulatory Visit: Payer: Medicare HMO

## 2016-07-31 DIAGNOSIS — B370 Candidal stomatitis: Secondary | ICD-10-CM | POA: Diagnosis not present

## 2016-07-31 DIAGNOSIS — B3781 Candidal esophagitis: Secondary | ICD-10-CM | POA: Diagnosis not present

## 2016-08-01 ENCOUNTER — Ambulatory Visit: Payer: Medicare HMO | Admitting: Family Medicine

## 2016-08-01 LAB — COMPREHENSIVE METABOLIC PANEL
ALT: 12 U/L (ref 6–29)
AST: 23 U/L (ref 10–35)
Albumin: 3 g/dL — ABNORMAL LOW (ref 3.6–5.1)
Alkaline Phosphatase: 100 U/L (ref 33–130)
BUN: 6 mg/dL — ABNORMAL LOW (ref 7–25)
CO2: 29 mmol/L (ref 20–31)
Calcium: 8.7 mg/dL (ref 8.6–10.4)
Chloride: 94 mmol/L — ABNORMAL LOW (ref 98–110)
Creat: 0.77 mg/dL (ref 0.50–0.99)
Glucose, Bld: 73 mg/dL (ref 65–99)
Potassium: 3.7 mmol/L (ref 3.5–5.3)
Sodium: 134 mmol/L — ABNORMAL LOW (ref 135–146)
Total Bilirubin: 0.2 mg/dL (ref 0.2–1.2)
Total Protein: 5.8 g/dL — ABNORMAL LOW (ref 6.1–8.1)

## 2016-08-01 LAB — HIV ANTIBODY (ROUTINE TESTING W REFLEX): HIV 1&2 Ab, 4th Generation: NONREACTIVE

## 2016-08-01 NOTE — Telephone Encounter (Signed)
Attempted to contact the pt to f/u with her about her ENT referral and lab results. No answer (recorded message. "The party you are trying to reach is unavailable. Please try your call again later.")

## 2016-08-01 NOTE — Telephone Encounter (Signed)
-----   Message from Smitty CordsAlexander J Karamalegos, DO sent at 08/01/2016 12:51 PM EDT ----- Please contact patient to review the following (No MyChart Access):  1. Chemistry - Mostly normal range, similar low sodium compared to previous, but improved.  2. HIV Screen - negative  No clear cause of thrush based on lab results.  No change to plan, would still recommend follow-up with ENT as planned by PCP Wilhelmina McardleLauren Kennedy, AGPCNP-BC - understand financial concern. Will stay tuned for update.  Saralyn PilarAlexander Karamalegos, DO [facility] 08/01/2016, 12:51 PM

## 2016-08-04 DIAGNOSIS — F3132 Bipolar disorder, current episode depressed, moderate: Secondary | ICD-10-CM | POA: Diagnosis not present

## 2016-08-07 DIAGNOSIS — G8929 Other chronic pain: Secondary | ICD-10-CM | POA: Diagnosis not present

## 2016-08-07 DIAGNOSIS — E441 Mild protein-calorie malnutrition: Secondary | ICD-10-CM | POA: Diagnosis not present

## 2016-08-07 DIAGNOSIS — A6009 Herpesviral infection of other urogenital tract: Secondary | ICD-10-CM | POA: Diagnosis not present

## 2016-08-07 DIAGNOSIS — K120 Recurrent oral aphthae: Secondary | ICD-10-CM | POA: Diagnosis not present

## 2016-08-07 DIAGNOSIS — Z87891 Personal history of nicotine dependence: Secondary | ICD-10-CM | POA: Diagnosis not present

## 2016-08-07 DIAGNOSIS — J302 Other seasonal allergic rhinitis: Secondary | ICD-10-CM | POA: Diagnosis not present

## 2016-08-07 NOTE — Progress Notes (Signed)
Have we been able to contact Ms. Laureen OchsCurrie for these results?

## 2016-08-12 ENCOUNTER — Telehealth: Payer: Self-pay | Admitting: Nurse Practitioner

## 2016-08-12 MED ORDER — ONDANSETRON HCL 4 MG PO TABS: 4.0000 mg | ORAL_TABLET | Freq: Three times a day (TID) | ORAL | 0 refills | Status: DC | PRN

## 2016-08-12 NOTE — Telephone Encounter (Signed)
The pt called complaining of intermittent vomiting x 1-2 weeks. The pt states no diarrhea or fever. She said she vomits whether she eats or not. Please advise

## 2016-08-12 NOTE — Telephone Encounter (Signed)
Pt has been unable to keep food or liquids down for about a week.  Her call back number is [phone]

## 2016-08-12 NOTE — Telephone Encounter (Signed)
She needs to be seen by a provider.  Please schedule with me tomorrow at 11 am.  Called medication for anti nausea to pharmacy.  Take one tablet ondansetron only for nausea / vomiting.  May take every 8 hours  If she is not able to drink any fluids between now and tomorrow, she needs to go to the ER.

## 2016-08-12 NOTE — Telephone Encounter (Signed)
Please see above instructions.

## 2016-08-13 ENCOUNTER — Ambulatory Visit (INDEPENDENT_AMBULATORY_CARE_PROVIDER_SITE_OTHER): Payer: Medicare HMO | Admitting: Nurse Practitioner

## 2016-08-13 ENCOUNTER — Encounter: Payer: Self-pay | Admitting: Nurse Practitioner

## 2016-08-13 ENCOUNTER — Telehealth: Payer: Self-pay

## 2016-08-13 ENCOUNTER — Ambulatory Visit
Admission: RE | Admit: 2016-08-13 | Discharge: 2016-08-13 | Disposition: A | Discharge status: Home / Self Care | Payer: Medicare HMO | Source: Ambulatory Visit | Attending: Nurse Practitioner | Admitting: Nurse Practitioner

## 2016-08-13 ENCOUNTER — Emergency Department
Admission: EM | Admit: 2016-08-13 | Discharge: 2016-08-13 | Disposition: A | Discharge status: Home / Self Care | Payer: Medicare HMO | Attending: Emergency Medicine | Admitting: Emergency Medicine

## 2016-08-13 ENCOUNTER — Emergency Department: Payer: Medicare HMO

## 2016-08-13 ENCOUNTER — Encounter: Payer: Self-pay | Admitting: Intensive Care

## 2016-08-13 VITALS — BP 103/52 | HR 92 | Temp 97.8°F | Ht 67.0 in | Wt 183.2 lb

## 2016-08-13 DIAGNOSIS — I10 Essential (primary) hypertension: Secondary | ICD-10-CM | POA: Insufficient documentation

## 2016-08-13 DIAGNOSIS — R140 Abdominal distension (gaseous): Secondary | ICD-10-CM

## 2016-08-13 DIAGNOSIS — B370 Candidal stomatitis: Secondary | ICD-10-CM | POA: Diagnosis not present

## 2016-08-13 DIAGNOSIS — E441 Mild protein-calorie malnutrition: Secondary | ICD-10-CM | POA: Diagnosis not present

## 2016-08-13 DIAGNOSIS — K5900 Constipation, unspecified: Secondary | ICD-10-CM | POA: Insufficient documentation

## 2016-08-13 DIAGNOSIS — E079 Disorder of thyroid, unspecified: Secondary | ICD-10-CM | POA: Insufficient documentation

## 2016-08-13 DIAGNOSIS — Z7982 Long term (current) use of aspirin: Secondary | ICD-10-CM | POA: Insufficient documentation

## 2016-08-13 DIAGNOSIS — Z79899 Other long term (current) drug therapy: Secondary | ICD-10-CM | POA: Insufficient documentation

## 2016-08-13 DIAGNOSIS — K8020 Calculus of gallbladder without cholecystitis without obstruction: Secondary | ICD-10-CM | POA: Diagnosis not present

## 2016-08-13 DIAGNOSIS — B3781 Candidal esophagitis: Secondary | ICD-10-CM | POA: Diagnosis not present

## 2016-08-13 DIAGNOSIS — R1110 Vomiting, unspecified: Secondary | ICD-10-CM | POA: Diagnosis not present

## 2016-08-13 DIAGNOSIS — Z87891 Personal history of nicotine dependence: Secondary | ICD-10-CM | POA: Insufficient documentation

## 2016-08-13 DIAGNOSIS — E871 Hypo-osmolality and hyponatremia: Secondary | ICD-10-CM

## 2016-08-13 DIAGNOSIS — K567 Ileus, unspecified: Secondary | ICD-10-CM

## 2016-08-13 DIAGNOSIS — E039 Hypothyroidism, unspecified: Secondary | ICD-10-CM | POA: Insufficient documentation

## 2016-08-13 DIAGNOSIS — K120 Recurrent oral aphthae: Secondary | ICD-10-CM | POA: Diagnosis not present

## 2016-08-13 DIAGNOSIS — E46 Unspecified protein-calorie malnutrition: Secondary | ICD-10-CM

## 2016-08-13 DIAGNOSIS — Z96642 Presence of left artificial hip joint: Secondary | ICD-10-CM | POA: Diagnosis not present

## 2016-08-13 DIAGNOSIS — R109 Unspecified abdominal pain: Secondary | ICD-10-CM | POA: Diagnosis present

## 2016-08-13 DIAGNOSIS — G8929 Other chronic pain: Secondary | ICD-10-CM | POA: Diagnosis not present

## 2016-08-13 DIAGNOSIS — J302 Other seasonal allergic rhinitis: Secondary | ICD-10-CM | POA: Diagnosis not present

## 2016-08-13 DIAGNOSIS — A6009 Herpesviral infection of other urogenital tract: Secondary | ICD-10-CM | POA: Diagnosis not present

## 2016-08-13 HISTORY — DX: Candidal stomatitis: B37.0

## 2016-08-13 LAB — COMPREHENSIVE METABOLIC PANEL
ALBUMIN: 3 g/dL — AB (ref 3.5–5.0)
ALT: 21 U/L (ref 14–54)
ANION GAP: 9 (ref 5–15)
AST: 35 U/L (ref 15–41)
Alkaline Phosphatase: 110 U/L (ref 38–126)
BILIRUBIN TOTAL: 0.4 mg/dL (ref 0.3–1.2)
BUN: 10 mg/dL (ref 6–20)
CO2: 27 mmol/L (ref 22–32)
Calcium: 9.4 mg/dL (ref 8.9–10.3)
Chloride: 96 mmol/L — ABNORMAL LOW (ref 101–111)
Creatinine, Ser: 0.82 mg/dL (ref 0.44–1.00)
GFR calc non Af Amer: >60 mL/min (ref >60)
GLUCOSE: 104 mg/dL — AB (ref 65–99)
POTASSIUM: 3.4 mmol/L — AB (ref 3.5–5.1)
SODIUM: 132 mmol/L — AB (ref 135–145)
TOTAL PROTEIN: 6.8 g/dL (ref 6.5–8.1)

## 2016-08-13 LAB — URINALYSIS, COMPLETE (UACMP) WITH MICROSCOPIC
BACTERIA UA: NONE SEEN
BILIRUBIN URINE: NEGATIVE
GLUCOSE, UA: NEGATIVE mg/dL
HGB URINE DIPSTICK: NEGATIVE
KETONES UR: NEGATIVE mg/dL
Leukocytes, UA: NEGATIVE
NITRITE: NEGATIVE
PH: 6 (ref 5.0–8.0)
PROTEIN: NEGATIVE mg/dL
Specific Gravity, Urine: 1.027 (ref 1.005–1.030)

## 2016-08-13 LAB — CBC
HEMATOCRIT: 34.6 % — AB (ref 35.0–47.0)
HEMOGLOBIN: 11.9 g/dL — AB (ref 12.0–16.0)
MCH: 30.9 pg (ref 26.0–34.0)
MCHC: 34.4 g/dL (ref 32.0–36.0)
MCV: 89.6 fL (ref 80.0–100.0)
Platelets: 508 10*3/uL — ABNORMAL HIGH (ref 150–440)
RBC: 3.86 MIL/uL (ref 3.80–5.20)
RDW: 14.8 % — ABNORMAL HIGH (ref 11.5–14.5)
WBC: 5.2 10*3/uL (ref 3.6–11.0)

## 2016-08-13 LAB — LIPASE, BLOOD: Lipase: 27 U/L (ref 11–51)

## 2016-08-13 MED ORDER — SODIUM CHLORIDE 0.9 % IV BOLUS (SEPSIS)
1000.0000 mL | Freq: Once | INTRAVENOUS | Status: AC
  Administered 2016-08-13: 1000 mL via INTRAVENOUS

## 2016-08-13 MED ORDER — IOPAMIDOL (ISOVUE-300) INJECTION 61%
100.0000 mL | Freq: Once | INTRAVENOUS | Status: AC | PRN
  Administered 2016-08-13: 100 mL via INTRAVENOUS

## 2016-08-13 MED ORDER — SENNOSIDES-DOCUSATE SODIUM 8.6-50 MG PO TABS: 2.0000 | ORAL_TABLET | Freq: Two times a day (BID) | ORAL | 0 refills | Status: DC

## 2016-08-13 MED ORDER — POLYETHYLENE GLYCOL 3350 17 GM/SCOOP PO POWD: ORAL | 0 refills | Status: DC

## 2016-08-13 NOTE — ED Notes (Signed)
Patient transported to CT 

## 2016-08-13 NOTE — Telephone Encounter (Signed)
LMTCB

## 2016-08-13 NOTE — Telephone Encounter (Signed)
Verdon CumminsJesse contact [phone] Home Health nurse

## 2016-08-13 NOTE — Assessment & Plan Note (Signed)
Pt w/ labs on 6/36/2018 w/ low sodium.  Pt is malnourished and was experiencing nausea and vomiting around time of lab draw.  Plan: 1. Encourage proper nutrition. 2. Follow up w/ lab collection in hospital.

## 2016-08-13 NOTE — ED Provider Notes (Signed)
[facility] Emergency Department Provider Note  ____________________________________________  Time seen: Approximately 4:48 PM  I have reviewed the triage vital signs and the nursing notes.   HISTORY  Chief Complaint Abdominal Pain    HPI Kelly Foster is a 66 y.o. female sent to the ED by her primary care doctor due to concern about ileus. The report that the patient has been having distended stomach and inability to eat with vomiting after eating for the past week. Patient reports that she's lost 6 pounds over the past week unintentionally. No fever or chills. No chest pain shortness of breath or back pain. No dizziness or syncope. No dysuria frequency urgency. She denies constipation reports that she had a normal large bowel movement this morning and feels better afterward. Pain is intermittent, worse with eating, no alleviating factors. Moderate intensity when present, sharp.     Past Medical History:  Diagnosis Date  . Adrenal insufficiency (HCC)   . Allergy   . Anxiety   . Arthritis   . Bipolar 1 disorder (HCC)   . Candidiasis of mouth and esophagus (HCC) 08/13/2016  . Central spinal stenosis   . Cervical radiculopathy, chronic   . Chronic adrenal insufficiency (HCC) 06/06/2013  . Chronic low back pain without sciatica   . Chronic pain syndrome   . Depression   . Disorder of left sacroiliac joint   . DJD (degenerative joint disease)   . Elevated CK   . Hemorrhoids 07/11/2016  . Hip pain   . History of kidney stones   . Hyperlipidemia   . Hypertension   . Hyponatremia   . Hypothyroidism   . Lower urinary tract infection 06/06/2013   Overview:  IMO Problem List Replacer Jan. 2016   . Lumbar radiculitis   . Manic depressive disorder (HCC)   . Neuritis or radiculitis due to rupture of lumbar intervertebral disc 05/29/2014  . OA (osteoarthritis) of hip   . Overweight   . Prediabetes   . Reflux esophagitis   . Sacrococcygeal disorders, not  elsewhere classified   . Sacroiliac joint dysfunction 04/05/2015  . Trochanteric bursitis of left hip   . Ulnar neuropathy   . Urinary frequency   . Vaginal atrophy      Patient Active Problem List   Diagnosis Date Noted  . Candidiasis of mouth and esophagus (HCC) 08/13/2016  . Protein malnutrition (HCC) 08/13/2016  . Hemorrhoids 07/11/2016  . Hyperlipidemia   . Depression   . Central spinal stenosis   . Cervical radiculopathy, chronic   . OA (osteoarthritis) of hip   . Chronic pain syndrome   . Chronic low back pain without sciatica   . Hypothyroidism   . Reflux esophagitis   . Sacroiliac joint dysfunction 04/05/2015  . 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 Intractable pain 12/20/2014  . Neuritis or radiculitis due to rupture of lumbar intervertebral disc 05/29/2014  . Lumbar radiculitis 05/29/2014  . Bipolar 1 disorder, mixed (HCC) 06/06/2013  . Chronic adrenal insufficiency (HCC) 06/06/2013  . Elevated CK 06/06/2013  . Below normal amount of sodium in the blood 06/06/2013  . Adynamia 06/06/2013     Past Surgical History:  Procedure Laterality Date  . ABDOMINAL HYSTERECTOMY    . arm surgery Left   . CESAREAN SECTION    . JOINT REPLACEMENT Left 2016   dec  . TOTAL HIP ARTHROPLASTY Left 12/26/2014   Procedure: TOTAL HIP ARTHROPLASTY ANTERIOR APPROACH;  Surgeon: Kennedy BuckerMichael Menz, MD;  Location: [facility] ORS;  Service: Orthopedics;  Laterality: Left;     Prior to Admission medications   Medication Sig Start Date End Date Taking? Authorizing Provider  amitriptyline (ELAVIL) 50 MG tablet Take 50 mg by mouth at bedtime.   Yes [provider]  aspirin 81 MG tablet Take 1 tablet (81 mg total) by mouth daily. 07/23/16  Yes Galen Manila, NP  Calcium Carbonate-Vitamin D3 (CALCIUM 600-D) 600-400 MG-UNIT TABS Take 1 tablet by mouth daily.   Yes [provider]  divalproex (DEPAKOTE ER) 250 MG 24 hr tablet Take 500 mg by mouth daily.   Yes  [provider]  montelukast (SINGULAIR) 10 MG tablet Take 1 tablet (10 mg total) by mouth at bedtime. Reported on 04/05/2015 07/23/16  Yes Galen Manila, NP  Multiple Vitamin (MULTIVITAMIN) tablet Take 1 tablet by mouth daily.   Yes [provider]  Olopatadine HCl 0.2 % SOLN Place 1 drop into both eyes daily. 07/14/16  Yes Galen Manila, NP  sertraline (ZOLOFT) 100 MG tablet Take 200 mg by mouth daily.    Yes [provider]  simvastatin (ZOCOR) 40 MG tablet Take 40 mg by mouth at bedtime.    Yes [provider]  acyclovir (ZOVIRAX) 400 MG tablet Take 400 mg by mouth 3 (three) times daily as needed (for flares).    [provider]  albuterol (PROVENTIL HFA;VENTOLIN HFA) 108 (90 BASE) MCG/ACT inhaler Inhale 2 puffs into the lungs every 6 (six) hours as needed for wheezing or shortness of breath.    [provider]  ARTIFICIAL TEARS 0.1-0.3 % SOLN Administer 1 drop to both eyes 4 (four) times a day as needed. 06/06/13   [provider]  bisacodyl (DULCOLAX) 5 MG EC tablet Take 5 mg by mouth at bedtime as needed for moderate constipation. Patient is taking 2 tabs.    [provider]  hydrocortisone (PROCTOSOL HC) 2.5 % rectal cream  06/26/15   [provider]  ondansetron (ZOFRAN) 4 MG tablet Take 1 tablet (4 mg total) by mouth every 8 (eight) hours as needed for nausea or vomiting. Patient not taking: Reported on 08/13/2016 08/12/16   Galen Manila, NP  oxybutynin (DITROPAN XL) 15 MG 24 hr tablet Take one in the am and one in the pm Patient not taking: Reported on 08/13/2016 10/29/15   Michiel Cowboy A, PA-C  polyethylene glycol powder (GLYCOLAX/MIRALAX) powder 1 cap full in a full glass of water, two times a day for 3 days. 08/13/16   Sharman Cheek, MD  senna-docusate (SENOKOT-S) 8.6-50 MG tablet Take 2 tablets by mouth 2 (two) times daily. 08/13/16   Sharman Cheek, MD  Spacer/Aero Chamber  Mouthpiece MISC by Does not apply route. 04/29/14   [provider]     Allergies Gabapentin; Codeine; Pregabalin; and Tramadol   Family History  Problem Relation Age of Onset  . Alzheimer's disease Mother   . Arthritis Mother   . Stroke Father   . Hypertension Father   . Colon cancer Sister   . Hypertension Sister   . Kidney disease Neg Hx     Social History Social History  Substance Use Topics  . Smoking status: Former Smoker    Packs/day: 1.00    Types: Cigarettes    Quit date: 03/01/1995  . Smokeless tobacco: Never Used     Comment: quit 1998  . Alcohol use No    Review of Systems  Constitutional:   No fever or  chills.  ENT:   No sore throat. No rhinorrhea. Cardiovascular:   No chest pain or syncope. Respiratory:   No dyspnea or cough. Gastrointestinal:   Abdominal pain as above with vomiting..  Musculoskeletal:   Negative for focal pain or swelling All other systems reviewed and are negative except as documented above in ROS and HPI.  ____________________________________________   PHYSICAL EXAM:  VITAL SIGNS: ED Triage Vitals [08/13/16 1401]  Enc Vitals Group     BP 119/70     Pulse Rate 98     Resp 16     Temp 98.1 F (36.7 C)     Temp Source Oral     SpO2 96 %     Weight 183 lb (83 kg)     Height 5\' 7"  (1.702 m)     Head Circumference      Peak Flow      Pain Score      Pain Loc      Pain Edu?      Excl. in GC?     Vital signs reviewed, nursing assessments reviewed.   Constitutional:   Alert and oriented. Well appearing and in no distress. Eyes:   No scleral icterus.  EOMI. No nystagmus. No conjunctival pallor. PERRL. ENT   Head:   Normocephalic and atraumatic.   Nose:   No congestion/rhinnorhea.    Mouth/Throat:   MMM, no pharyngeal erythema. No peritonsillar mass.    Neck:   No meningismus. Full ROM Hematological/Lymphatic/Immunilogical:   No cervical lymphadenopathy. Cardiovascular:   RRR. Symmetric bilateral  radial and DP pulses.  No murmurs.  Respiratory:   Normal respiratory effort without tachypnea/retractions. Breath sounds are clear and equal bilaterally. No wheezes/rales/rhonchi. Gastrointestinal:   Soft With right upper quadrant tenderness. Mildly distended, not tense or tympanitic. There is no CVA tenderness.  No rebound, rigidity, or guarding. Genitourinary:   deferred Musculoskeletal:   Normal range of motion in all extremities. No joint effusions.  No lower extremity tenderness.  No edema. Neurologic:   Normal speech and language.  Motor grossly intact. No gross focal neurologic deficits are appreciated.  Skin:    Skin is warm, dry and intact. No rash noted.  No petechiae, purpura, or bullae.  ____________________________________________    LABS (pertinent positives/negatives) (all labs ordered are listed, but only abnormal results are displayed) Labs Reviewed  COMPREHENSIVE METABOLIC PANEL - Abnormal; Notable for the following:       Result Value   Sodium 132 (*)    Potassium 3.4 (*)    Chloride 96 (*)    Glucose, Bld 104 (*)    Albumin 3.0 (*)    All other components within normal limits  CBC - Abnormal; Notable for the following:    Hemoglobin 11.9 (*)    HCT 34.6 (*)    RDW 14.8 (*)    Platelets 508 (*)    All other components within normal limits  URINALYSIS, COMPLETE (UACMP) WITH MICROSCOPIC - Abnormal; Notable for the following:    Color, Urine YELLOW (*)    APPearance CLEAR (*)    Squamous Epithelial / LPF 0-5 (*)    All other components within normal limits  LIPASE, BLOOD   ____________________________________________   EKG    ____________________________________________    RADIOLOGY  No results found.  ____________________________________________   PROCEDURES Procedures  ____________________________________________   INITIAL IMPRESSION / ASSESSMENT AND PLAN / ED COURSE  Pertinent labs & imaging results that were available during my care  of the  patient were reviewed by me and considered in my medical decision making (see chart for details).  Patient well. No acute distress, unremarkable vital signs. Sent to the ED for concern of an ileus on a plain film. Abdomen overall appears benign on exam but possibly raises suspicion of biliary disease. With the primary care doctor's findings, a CT scan was obtained which is essentially unremarkable. It does appear to show a large amount of retained stool and constipation on my viewing of the images. Patient does report that she had a large bowel movement today and feels better afterward. She may benefit from MiraLAX.  Due to her overall general health, suspicion of gall bladder wall edema on CT, right upper quadrant tenderness, I will get a ultrasound of the gallbladder for further assessment. If that is unremarkable and the patient is tolerating oral intake, the patient will be suitable for discharge home.      ____________________________________________   FINAL CLINICAL IMPRESSION(S) / ED DIAGNOSES  Final diagnoses:  Calculus of gallbladder without cholecystitis without obstruction  Constipation, unspecified constipation type      Discharge Medication List as of 08/13/2016  7:29 PM    START taking these medications   Details  polyethylene glycol powder (GLYCOLAX/MIRALAX) powder 1 cap full in a full glass of water, two times a day for 3 days., Print    senna-docusate (SENOKOT-S) 8.6-50 MG tablet Take 2 tablets by mouth 2 (two) times daily., Starting Wed 08/13/2016, Print         Portions of this note were generated with dragon dictation software. Dictation errors may occur despite best attempts at proofreading.    Sharman Cheek, MD 08/19/16 2329

## 2016-08-13 NOTE — Assessment & Plan Note (Addendum)
Worsened since last visit.  Pt w/ poor history for compliance w/ diflucan treatment. HIV negative.  Plan: 1. Needs evaluation by GI or ENT.  Pt refuses to go to ENT appointment already requested multiple times.

## 2016-08-13 NOTE — Assessment & Plan Note (Addendum)
Pt w/o adherence to full dose protein supplementation or other meals as discussed at last visit.  Pt is very poor historian and seems mentally and cognitively incapable of providing this care for herself consistently. She notes her son has been cooking some meals for her.  Plan: 1. Continue protein supplementation, increasing to full dose each day.  Eat regular meals. 2. Treat abdominal distention associated w/ n/v. 3. Treat oral/esophageal candidiasis per ENT/GI pt was not previously willing to visit ENT physician.  4. Follow up in clinic in 2-4 weeks.

## 2016-08-13 NOTE — Discharge Instructions (Signed)
Results for orders placed or performed during the hospital encounter of 08/13/16  Lipase, blood  Result Value Ref Range   Lipase 27 11 - 51 U/L  Comprehensive metabolic panel  Result Value Ref Range   Sodium 132 (L) 135 - 145 mmol/L   Potassium 3.4 (L) 3.5 - 5.1 mmol/L   Chloride 96 (L) 101 - 111 mmol/L   CO2 27 22 - 32 mmol/L   Glucose, Bld 104 (H) 65 - 99 mg/dL   BUN 10 6 - 20 mg/dL   Creatinine, Ser 1.610.82 0.44 - 1.00 mg/dL   Calcium 9.4 8.9 - 09.610.3 mg/dL   Total Protein 6.8 6.5 - 8.1 g/dL   Albumin 3.0 (L) 3.5 - 5.0 g/dL   AST 35 15 - 41 U/L   ALT 21 14 - 54 U/L   Alkaline Phosphatase 110 38 - 126 U/L   Total Bilirubin 0.4 0.3 - 1.2 mg/dL   GFR calc non Af Amer >60 >60 mL/min   GFR calc Af Amer >60 >60 mL/min   Anion gap 9 5 - 15  CBC  Result Value Ref Range   WBC 5.2 3.6 - 11.0 K/uL   RBC 3.86 3.80 - 5.20 MIL/uL   Hemoglobin 11.9 (L) 12.0 - 16.0 g/dL   HCT 04.534.6 (L) 40.935.0 - 81.147.0 %   MCV 89.6 80.0 - 100.0 fL   MCH 30.9 26.0 - 34.0 pg   MCHC 34.4 32.0 - 36.0 g/dL   RDW 91.414.8 (H) 78.211.5 - 95.614.5 %   Platelets 508 (H) 150 - 440 K/uL  Urinalysis, Complete w Microscopic  Result Value Ref Range   Color, Urine YELLOW (A) YELLOW   APPearance CLEAR (A) CLEAR   Specific Gravity, Urine 1.027 1.005 - 1.030   pH 6.0 5.0 - 8.0   Glucose, UA NEGATIVE NEGATIVE mg/dL   Hgb urine dipstick NEGATIVE NEGATIVE   Bilirubin Urine NEGATIVE NEGATIVE   Ketones, ur NEGATIVE NEGATIVE mg/dL   Protein, ur NEGATIVE NEGATIVE mg/dL   Nitrite NEGATIVE NEGATIVE   Leukocytes, UA NEGATIVE NEGATIVE   RBC / HPF 0-5 0 - 5 RBC/hpf   WBC, UA 0-5 0 - 5 WBC/hpf   Bacteria, UA NONE SEEN NONE SEEN   Squamous Epithelial / LPF 0-5 (A) NONE SEEN   Mucous PRESENT    Hyaline Casts, UA PRESENT    Dg Abd 1 View  Result Date: 08/13/2016 CLINICAL DATA:  Abdominal distention.  Possible ileus. EXAM: ABDOMEN - 1 VIEW COMPARISON:  CT 06/25/2015 FINDINGS: Mild gaseous distention of bowel centrally in the abdomen and upper  pelvis. This may reflect tortuous portions of the colon. No convincing evidence for bowel obstruction. No organomegaly or free air. IMPRESSION: Mild gaseous distention of bowel centrally in the abdomen, favored to represent colon. This may reflect mild ileus. Electronically Signed   By: Charlett NoseKevin  Dover M.D.   On: 08/13/2016 14:12   Ct Abdomen Pelvis W Contrast  Result Date: 08/13/2016 CLINICAL DATA:  Abdominal distension with right-sided pain. Vomiting. EXAM: CT ABDOMEN AND PELVIS WITH CONTRAST TECHNIQUE: Multidetector CT imaging of the abdomen and pelvis was performed using the standard protocol following bolus administration of intravenous contrast. CONTRAST:  100mL ISOVUE-300 IOPAMIDOL (ISOVUE-300) INJECTION 61% COMPARISON:  06/25/2015. FINDINGS: Lower chest: Lung bases show no acute findings. Heart size normal. No pericardial or pleural effusion. Hepatobiliary: The liver is unremarkable. Gallbladder wall may be mildly edematous. No biliary ductal dilatation. Pancreas: Negative. Spleen: Negative. Adrenals/Urinary Tract: Adrenal glands are unremarkable. Horseshoe kidney. Ureters are  decompressed. Bladder is grossly unremarkable although there is a fair amount of streak artifact from a left hip arthroplasty. Stomach/Bowel: Stomach, small bowel, appendix and colon are unremarkable. Vascular/Lymphatic: Atherosclerotic calcification of the arterial vasculature without aneurysm. No pathologically enlarged lymph nodes. Reproductive: Hysterectomy.  No adnexal mass. Other: No free fluid. Mesenteries and peritoneum are unremarkable. Mild laxity of the abdominal wall at the umbilicus. Musculoskeletal: Left total hip arthroplasty. Degenerative changes in the spine. Old left rib fractures. Right hip osteoarthritis. IMPRESSION: 1. No findings to explain the patient's symptoms. 2.  Aortic atherosclerosis (ICD10-170.0). Electronically Signed   By: Leanna Battles M.D.   On: 08/13/2016 16:28   US Abdomen Limited Ruq  Result  Date: 08/13/2016 CLINICAL DATA:  Vomiting, right upper quadrant pain EXAM: ULTRASOUND ABDOMEN LIMITED RIGHT UPPER QUADRANT COMPARISON:  CT today FINDINGS: Gallbladder: Multiple gallstones within the gallbladder, the largest 11 mm. Mild gallbladder wall upper limits normal at 3 mm. Negative sonographic Murphy's. Common bile duct: Diameter: Normal caliber, 4 mm. Liver: No focal lesion identified. Within normal limits in parenchymal echogenicity. IMPRESSION: Cholelithiasis.  No sonographic evidence of acute cholecystitis. Electronically Signed   By: Charlett Nose M.D.   On: 08/13/2016 17:48

## 2016-08-13 NOTE — Progress Notes (Signed)
I have reviewed this encounter including the documentation in this note and/or discussed this patient with the provider, Wilhelmina McardleLauren Kennedy, AGPCNP-BC. I am certifying that I agree with the content of this note as supervising physician.  Saralyn PilarAlexander Caidence Higashi, DO [facility] 08/13/2016, 12:45 PM

## 2016-08-13 NOTE — Progress Notes (Signed)
Subjective:    Patient ID: Adalberto Coleeggy Jean Bagot, female    DOB: 12/24/1950, 66 y.o.   MRN: [id number]  Gigi Gineggy Ronda FairlyJean Gonser is a 66 y.o. female presenting on 08/13/2016 for Emesis (the pt states she been vomiting x 1-2 weeks )   HPI Thrush Tongue is coated white - pt states her son noticed this today.  Mouth is not sore and pt could eat if she didn't have fear of nausea. Pt states she took her diflucan, but Ivan AnchorsJesse RN from [facility] states she is not taking pills from her pill box.  Home care was ordered as method to assess medication administration due to concerns for incorrect admin / nonadherence.  Protein Malnutrition Pt has lost 6 lbs since last seen in clinic on 6/22.  She states she is only putting 1/2 tsp protein supplement in her daily smoothie.  Couldn't use whole scoop r/t didn't taste good.  She is not able to tolerate eating r/t nausea and vomiting and has stopped eating.  Nausea w/ vomiting - started 2 weeks ago and started vomiting everything she eats.  Nausea does not persist if she is not eating.  She is able to drink water w/o symptoms. - occurs every day and at every meal since it started.  Last night ate 3 shrimp and went to bed w/o vomiting. Today, she has not eaten anything and denies nausea and vomiting.  Pt states she took medication today and has not had n/v so far. Yesterday vomited her medications.    Currently denies abdominal pain, nausea, vomiting, constipation, and diarrhea. Endorses daily BM this week. Today BM was watery without any loose pieces.  Pt unable to recall BM character prior to today.  Last week only had BM x 3 days. She states that her clothes don't fit because she has lost so much weight, but she is wearing athletic shorts today w/ elastic waistband - inconsistent w/ prior visits to clinic.  Social History  Substance Use Topics  . Smoking status: Former Smoker    Packs/day: 1.00    Types: Cigarettes    Quit date: 03/01/1995  . Smokeless tobacco:  Never Used     Comment: quit 1998  . Alcohol use No    Review of Systems Per HPI unless specifically indicated above     Objective:    BP (!) 103/52 (BP Location: Right Arm, Patient Position: Sitting, Cuff Size: Normal)   Pulse 92   Temp 97.8 F (36.6 C) (Oral)   Ht 5\' 7"  (1.702 m)   Wt 183 lb 3.2 oz (83.1 kg)   BMI 28.69 kg/m   Wt Readings from Last 3 Encounters:  08/13/16 183 lb 3.2 oz (83.1 kg)  07/23/16 192 lb (87.1 kg)  07/11/16 186 lb 12.8 oz (84.7 kg)    Physical Exam  Constitutional: She is oriented to person, place, and time. Vital signs are normal. She appears well-developed. No distress.  Thin appearing extremities, appears malnourished  HENT:  Head: Normocephalic and atraumatic.  Nose: Nose normal.  Mouth/Throat: Uvula is midline.    Cardiovascular: Normal rate, regular rhythm and normal heart sounds.   Pulmonary/Chest: Effort normal and breath sounds normal. No respiratory distress.  Abdominal: She exhibits distension. Bowel sounds are decreased. There is tenderness in the right upper quadrant.    Pt w/ significant distension w/ taut firm abdomen.  Neurological: She is alert and oriented to person, place, and time.  Skin: Skin is warm and dry.  Psychiatric: She  has a normal mood and affect. Her speech is tangential. She is withdrawn. Cognition and memory are impaired. She expresses impulsivity and inappropriate judgment. She exhibits abnormal recent memory.  Pt states not able to go to hospital today.  Asks if she can go tomorrow. She is inattentive.     Results for orders placed or performed in visit on 07/31/16  Comprehensive metabolic panel  Result Value Ref Range   Sodium 134 (L) 135 - 146 mmol/L   Potassium 3.7 3.5 - 5.3 mmol/L   Chloride 94 (L) 98 - 110 mmol/L   CO2 29 20 - 31 mmol/L   Glucose, Bld 73 65 - 99 mg/dL   BUN 6 (L) 7 - 25 mg/dL   Creat 1.61 0.96 - 0.45 mg/dL   Total Bilirubin 0.2 0.2 - 1.2 mg/dL   Alkaline Phosphatase 100 33 -  130 U/L   AST 23 10 - 35 U/L   ALT 12 6 - 29 U/L   Total Protein 5.8 (L) 6.1 - 8.1 g/dL   Albumin 3.0 (L) 3.6 - 5.1 g/dL   Calcium 8.7 8.6 - 40.9 mg/dL  HIV antibody  Result Value Ref Range   HIV 1&2 Ab, 4th Generation NONREACTIVE NONREACTIVE      Assessment & Plan:   Problem List Items Addressed This Visit      Digestive   Candidiasis of mouth and esophagus (HCC)    Worsened since last visit.  Pt w/ poor history for compliance w/ diflucan treatment. HIV negative.  Plan: 1. Needs evaluation by GI or ENT.  Pt refuses to go to ENT appointment already requested multiple times.        Other   Below normal amount of sodium in the blood    Pt w/ labs on 6/36/2018 w/ low sodium.  Pt is malnourished and was experiencing nausea and vomiting around time of lab draw.  Plan: 1. Encourage proper nutrition. 2. Follow up w/ lab collection in hospital.      Protein malnutrition (HCC)    Pt w/o adherence to full dose protein supplementation or other meals as discussed at last visit.  Pt is very poor historian and seems mentally and cognitively incapable of providing this care for herself consistently. She notes her son has been cooking some meals for her.  Plan: 1. Continue protein supplementation, increasing to full dose each day.  Eat regular meals. 2. Treat abdominal distention associated w/ n/v. 3. Treat oral/esophageal candidiasis per ENT/GI pt was not previously willing to visit ENT physician.  4. Follow up in clinic in 2-4 weeks.        Other Visit Diagnoses    Abdominal distention    -  Primary Ileus (HCC) Pt w/ abdominal distention, tenderness to palpation of RUQ, watery stool, n/v x 2 weeks.  My interpretation of Xray confirms gas-filled, enlarged bowel consistent w/ clinical diagnosis.  Plan: 1. Obtain Abd 1 view x-ray 2. Proceed w/ treatment in hospital for ileus.  Pt will drive herself to ED.  Pt's son Celene Kras was called and informed of the plan.  He states he will meet  her at [facility] ED.  Celene Kras [phone] 3. Prefer admission for treatment as it is very difficult for pt to adhere to instructions and is very difficult to communicate w/ pt for close follow up as outpatient.   Relevant Orders   DG Abd 1 View      No orders of the defined types were placed in this encounter.  Follow up plan: Return for GO TO [facility] ED NOW and plan for hospital admission.  A total of 45 minutes was spent face-to-face with this patient. Greater than 50% of this time was spent in counseling and coordination of care with the patient.     Wilhelmina Mcardle, DNP, AGPCNP-BC Adult Gerontology Primary Care Nurse Practitioner [facility] 08/13/2016, 12:42 PM

## 2016-08-13 NOTE — ED Notes (Signed)
Patient transported to Ultrasound 

## 2016-08-13 NOTE — ED Triage Notes (Signed)
Patients PCP sent her here for blocked intestine after scan completed this morning. C/o bloated stomach and emesis after eating X1 week. Only reports pain when R side of abdomen is pressed on.

## 2016-08-13 NOTE — Telephone Encounter (Signed)
Jesse the [facility] nurse called stating the pt is complaining of nausea & vomiting x 1 week. He also states that her tongue is covered with white stuff. He said that she have lose 25lbs in 6wks. I informed him that the pt called yesterday. I attempted to follow back up with her and she never answered her phone. I called her 3 times and left 2 (two) messages on her vm, but she did not return my call. She was scheduled to come in the office today at 11:00am.

## 2016-08-19 ENCOUNTER — Telehealth: Payer: Self-pay

## 2016-08-19 ENCOUNTER — Telehealth: Payer: Self-pay | Admitting: Surgery

## 2016-08-19 ENCOUNTER — Telehealth: Payer: Self-pay | Admitting: Nurse Practitioner

## 2016-08-19 DIAGNOSIS — G8929 Other chronic pain: Secondary | ICD-10-CM | POA: Diagnosis not present

## 2016-08-19 DIAGNOSIS — Z87891 Personal history of nicotine dependence: Secondary | ICD-10-CM | POA: Diagnosis not present

## 2016-08-19 DIAGNOSIS — A6009 Herpesviral infection of other urogenital tract: Secondary | ICD-10-CM | POA: Diagnosis not present

## 2016-08-19 DIAGNOSIS — K120 Recurrent oral aphthae: Secondary | ICD-10-CM | POA: Diagnosis not present

## 2016-08-19 DIAGNOSIS — E441 Mild protein-calorie malnutrition: Secondary | ICD-10-CM | POA: Diagnosis not present

## 2016-08-19 DIAGNOSIS — J302 Other seasonal allergic rhinitis: Secondary | ICD-10-CM | POA: Diagnosis not present

## 2016-08-19 NOTE — Telephone Encounter (Signed)
The pt called with concerns because her Home Health nurse said she suppose to be on Diflucan, but its not in her medication list. She said she contacted the pharmacy and they said they never received the prescription. I contact [facility] and they informed me that the pt picked up her prescription on June 22 nd. I informed the pt and she insist that she never picked up the script and if she did she never took it. Please advise

## 2016-08-19 NOTE — Telephone Encounter (Signed)
Pt is very poor historian.  Home Health nurse is not improving med management communication difficulties.   At last visit, pt stated she took the diflucan.  If it was picked up and is no longer in the home, the patient probably took it.  It will not be repeated.  If pt still having thrush problems, she needs to see ENT.  Nothing else for thrush for this episode will be prescribed from this clinic.  Did pt have a bowel movement or resolution of abdominal pain after her ED visit?  She was requested follow up w/ me 1 day after ED visit, but has not yet come.

## 2016-08-19 NOTE — Telephone Encounter (Signed)
She needs to continue these until she has an appointment with me to reevaluate.  If more than 1 bm per day, we can transition to stool softener only.  He may continue the weekly medication visits for two more weeks, but it hasn't seemed to be as helpful as expected.

## 2016-08-19 NOTE — Telephone Encounter (Signed)
I spoke w/ the pt and the caregiver and gave the advise.

## 2016-08-19 NOTE — Telephone Encounter (Signed)
Verdon [facility]Jesse from [facility] is requesting clarifications on the laxative that was prescribed to her by the hospital  Senna -docusate 8.6- 50MG  w/ the instructions to take (2) two tablets by mouth (2) times daily. Qty:120. He needs to know about the laxatives going forward. He also needs a order for 2 additional weekly visits for medication management. Please advise FYI: I notice in the pt chart that general surgery attempted to contact her to schedule an appt for Gallstones, but the pt did not answer.

## 2016-08-19 NOTE — Telephone Encounter (Signed)
Verdon CumminsJesse with [facility] said pt went to hospital after last visit and was treated with laatives.  He needs to now about use of laxatives going forward.  He needs a verbal for 2 additional weekly weekly visits for medication management.  He said pt did not pick up prescription for diflucan.  His call back number is [phone]

## 2016-08-19 NOTE — Telephone Encounter (Signed)
Patient was seen in ED at [facility] on (08/13/16) for Cholelithiasis. A referral was sent from the ED to make an appointment for the patient. I have called patient to make appointment. No answer. I have left a message on voicemail.   If patient calls back, please make an appointment for patient with any of our surgeons.

## 2016-08-20 ENCOUNTER — Ambulatory Visit: Payer: Medicare HMO | Admitting: Surgery

## 2016-08-20 NOTE — Telephone Encounter (Signed)
Patient has called back and an appointment was made with Dr Earlene Plateravis on 08/20/16 @ 3:00pm.

## 2016-08-21 ENCOUNTER — Encounter: Payer: Self-pay | Admitting: Nurse Practitioner

## 2016-08-21 ENCOUNTER — Ambulatory Visit (INDEPENDENT_AMBULATORY_CARE_PROVIDER_SITE_OTHER): Payer: Medicare HMO | Admitting: Nurse Practitioner

## 2016-08-21 ENCOUNTER — Ambulatory Visit: Payer: Medicare HMO | Admitting: Surgery

## 2016-08-21 VITALS — BP 91/53 | HR 101 | Temp 98.5°F | Ht 67.0 in | Wt 179.6 lb

## 2016-08-21 DIAGNOSIS — R1084 Generalized abdominal pain: Secondary | ICD-10-CM | POA: Diagnosis not present

## 2016-08-21 DIAGNOSIS — B3781 Candidal esophagitis: Secondary | ICD-10-CM | POA: Diagnosis not present

## 2016-08-21 DIAGNOSIS — B370 Candidal stomatitis: Secondary | ICD-10-CM | POA: Diagnosis not present

## 2016-08-21 MED ORDER — POLYETHYLENE GLYCOL 3350 17 GM/SCOOP PO POWD: ORAL | 0 refills | Status: DC

## 2016-08-21 MED ORDER — ALBUTEROL SULFATE HFA 108 (90 BASE) MCG/ACT IN AERS: 2.0000 | INHALATION_SPRAY | Freq: Four times a day (QID) | RESPIRATORY_TRACT | 3 refills | Status: DC | PRN

## 2016-08-21 NOTE — Progress Notes (Signed)
Subjective:    Patient ID: Kelly Foster, female    DOB: 10/24/1950, 66 y.o.   MRN: [id number]  Kelly Foster is a 66 y.o. female presenting on 08/21/2016 for Hospitalization Follow-up (Constipation)   HPI Thrush/Sore Mouth Thrush has resolved. Pt is eating regular meals. Still has occasional canker sore.  Abdominal Pain/ Constipation No longer having abdominal pain. Pt is having 3-4 bowel movements daily.  SHe notes she feels good and would like to stop her diarrhea.  She is having soft bowel movements occasionally watery, but no hard pellets.  She has an appointment with her general surgeon regarding her gall bladder upcoming.    Social History  Substance Use Topics  . Smoking status: Former Smoker    Packs/day: 1.00    Types: Cigarettes    Quit date: 03/01/1995  . Smokeless tobacco: Never Used     Comment: quit 1998  . Alcohol use No    Review of Systems Per HPI unless specifically indicated above     Objective:    BP (!) 91/53 (BP Location: Right Arm, Patient Position: Sitting, Cuff Size: Normal)   Pulse (!) 101   Temp 98.5 F (36.9 C) (Oral)   Ht 5\' 7"  (1.702 m)   Wt 179 lb 9.6 oz (81.5 kg)   BMI 28.13 kg/m   Wt Readings from Last 3 Encounters:  08/21/16 179 lb 9.6 oz (81.5 kg)  08/13/16 183 lb (83 kg)  08/13/16 183 lb 3.2 oz (83.1 kg)    Physical Exam  Constitutional: She is oriented to person, place, and time. She appears well-developed and well-nourished. No distress.  HENT:  Head: Normocephalic and atraumatic.  Mouth/Throat: Uvula is midline, oropharynx is clear and moist and mucous membranes are normal. Oral lesions present.    Eyes: Pupils are equal, round, and reactive to light. Conjunctivae are normal.  Neck: Normal range of motion. Neck supple. No thyromegaly present.  Cardiovascular: Normal rate, regular rhythm and normal heart sounds.   Pulmonary/Chest: Effort normal and breath sounds normal. No respiratory distress.  Abdominal: Soft.  Bowel sounds are increased. There is no hepatosplenomegaly. There is no tenderness.  Pt w/ rounded abdomen as normal finding, significant decrease in distention and very soft abdomen today  Neurological: She is alert and oriented to person, place, and time.  Skin: Skin is warm and dry.  Psychiatric: She has a normal mood and affect. Her behavior is normal. Judgment and thought content normal.  Vitals reviewed.  Results for orders placed or performed during the hospital encounter of 08/13/16  Lipase, blood  Result Value Ref Range   Lipase 27 11 - 51 U/L  Comprehensive metabolic panel  Result Value Ref Range   Sodium 132 (L) 135 - 145 mmol/L   Potassium 3.4 (L) 3.5 - 5.1 mmol/L   Chloride 96 (L) 101 - 111 mmol/L   CO2 27 22 - 32 mmol/L   Glucose, Bld 104 (H) 65 - 99 mg/dL   BUN 10 6 - 20 mg/dL   Creatinine, Ser 9.140.82 0.44 - 1.00 mg/dL   Calcium 9.4 8.9 - 78.210.3 mg/dL   Total Protein 6.8 6.5 - 8.1 g/dL   Albumin 3.0 (L) 3.5 - 5.0 g/dL   AST 35 15 - 41 U/L   ALT 21 14 - 54 U/L   Alkaline Phosphatase 110 38 - 126 U/L   Total Bilirubin 0.4 0.3 - 1.2 mg/dL   GFR calc non Af Amer >60 >60 mL/min   GFR calc  Af Amer >60 >60 mL/min   Anion gap 9 5 - 15  CBC  Result Value Ref Range   WBC 5.2 3.6 - 11.0 K/uL   RBC 3.86 3.80 - 5.20 MIL/uL   Hemoglobin 11.9 (L) 12.0 - 16.0 g/dL   HCT 16.1 (L) 09.6 - 04.5 %   MCV 89.6 80.0 - 100.0 fL   MCH 30.9 26.0 - 34.0 pg   MCHC 34.4 32.0 - 36.0 g/dL   RDW 40.9 (H) 81.1 - 91.4 %   Platelets 508 (H) 150 - 440 K/uL  Urinalysis, Complete w Microscopic  Result Value Ref Range   Color, Urine YELLOW (A) YELLOW   APPearance CLEAR (A) CLEAR   Specific Gravity, Urine 1.027 1.005 - 1.030   pH 6.0 5.0 - 8.0   Glucose, UA NEGATIVE NEGATIVE mg/dL   Hgb urine dipstick NEGATIVE NEGATIVE   Bilirubin Urine NEGATIVE NEGATIVE   Ketones, ur NEGATIVE NEGATIVE mg/dL   Protein, ur NEGATIVE NEGATIVE mg/dL   Nitrite NEGATIVE NEGATIVE   Leukocytes, UA NEGATIVE NEGATIVE    RBC / HPF 0-5 0 - 5 RBC/hpf   WBC, UA 0-5 0 - 5 WBC/hpf   Bacteria, UA NONE SEEN NONE SEEN   Squamous Epithelial / LPF 0-5 (A) NONE SEEN   Mucous PRESENT    Hyaline Casts, UA PRESENT       Assessment & Plan:   Problem List Items Addressed This Visit      Digestive   RESOLVED: Candidiasis of mouth and esophagus (HCC) - Primary    Other Visit Diagnoses    Generalized abdominal pain       Pt w/ occasional pain.  Constipation is resolved and now trending toward diarrhea.  Plan: 1. Continue miralax as needed if no BM in 3 days.   2. Drink plenty of water and consume plenty of dietary fiber. 3. Follow up as needed.      Meds ordered this encounter  Medications  . polyethylene glycol powder (GLYCOLAX/MIRALAX) powder    Sig: 1 cap full in a full glass of water, two times a day for 3 days if you have not had a bowel movement in 3 days.    Dispense:  255 g    Refill:  0    Order Specific Question:   Supervising Provider    Answer:   Smitty Cords [[id number]]  . albuterol (PROVENTIL HFA;VENTOLIN HFA) 108 (90 Base) MCG/ACT inhaler    Sig: Inhale 2 puffs into the lungs every 6 (six) hours as needed for wheezing or shortness of breath.    Dispense:  1 Inhaler    Refill:  3    Order Specific Question:   Supervising Provider    Answer:   Smitty Cords [[id number]]      Follow up plan: Return if symptoms worsen or fail to improve and in August as we have already scheduled.  Kelly Mcardle, DNP, AGPCNP-BC Adult Gerontology Primary Care Nurse Practitioner [facility] 08/24/2016, 10:52 PM

## 2016-08-21 NOTE — Patient Instructions (Addendum)
Kelly Foster, Thank you for coming in to clinic today.  1. You are having normal bowel movements again. - You can stop taking the senokot.   - Take miralax 1 scoop in full glass of water for 3 days if you have not had a bowel movement in 3 days. - Prevent constipation with the instructions at the back of these papers.  2. Keep your appointment with the general surgeon for your gall bladder.  3. Verdon CumminsJesse will continue to help you with your medicines for 2 more weeks.  Please schedule a follow-up appointment with Wilhelmina McardleLauren Azora Bonzo, AGNP to Return if symptoms worsen or fail to improve and in August as we have already scheduled.  If you have any other questions or concerns, please feel free to call the clinic or send a message through MyChart. You may also schedule an earlier appointment if necessary.  Wilhelmina McardleLauren Stehanie Ekstrom, DNP, AGNP-BC Adult Gerontology Nurse Practitioner [facility]    Constipation, Adult Constipation is when a person:  Poops (has a bowel movement) fewer times in a week than normal.  Has a hard time pooping.  Has poop that is dry, hard, or bigger than normal.  Follow these instructions at home: Eating and drinking   Eat foods that have a lot of fiber, such as: ? Fresh fruits and vegetables. ? Whole grains. ? Beans.  Eat less of foods that are high in fat, low in fiber, or overly processed, such as: ? [place] fries. ? Hamburgers. ? Cookies. ? Candy. ? Soda.  Drink enough fluid to keep your pee (urine) clear or pale yellow. General instructions  Exercise regularly or as told by your doctor.  Go to the restroom when you feel like you need to poop. Do not hold it in.  Take over-the-counter and prescription medicines only as told by your doctor. These include any fiber supplements.  Do pelvic floor retraining exercises, such as: ? Doing deep breathing while relaxing your lower belly (abdomen). ? Relaxing your pelvic floor while pooping.  Watch  your condition for any changes.  Keep all follow-up visits as told by your doctor. This is important. Contact a doctor if:  You have pain that gets worse.  You have a fever.  You have not pooped for 4 days.  You throw up (vomit).  You are not hungry.  You lose weight.  You are bleeding from the anus.  You have thin, pencil-like poop (stool). Get help right away if:  You have a fever, and your symptoms suddenly get worse.  You leak poop or have blood in your poop.  Your belly feels hard or bigger than normal (is bloated).  You have very bad belly pain.  You feel dizzy or you faint. This information is not intended to replace advice given to you by your health care provider. Make sure you discuss any questions you have with your health care provider. Document Released: 07/09/2007 Document Revised: 08/10/2015 Document Reviewed: 07/11/2015 Elsevier Interactive Patient Education  2017 [employer]

## 2016-08-25 ENCOUNTER — Telehealth: Payer: Self-pay

## 2016-08-25 DIAGNOSIS — E441 Mild protein-calorie malnutrition: Secondary | ICD-10-CM | POA: Diagnosis not present

## 2016-08-25 DIAGNOSIS — G8929 Other chronic pain: Secondary | ICD-10-CM | POA: Diagnosis not present

## 2016-08-25 DIAGNOSIS — J302 Other seasonal allergic rhinitis: Secondary | ICD-10-CM | POA: Diagnosis not present

## 2016-08-25 DIAGNOSIS — Z87891 Personal history of nicotine dependence: Secondary | ICD-10-CM | POA: Diagnosis not present

## 2016-08-25 DIAGNOSIS — A6009 Herpesviral infection of other urogenital tract: Secondary | ICD-10-CM | POA: Diagnosis not present

## 2016-08-25 DIAGNOSIS — K120 Recurrent oral aphthae: Secondary | ICD-10-CM | POA: Diagnosis not present

## 2016-08-25 NOTE — Telephone Encounter (Signed)
Kelly Foster from [facility] called and would like a call back in patient medication.

## 2016-08-25 NOTE — Progress Notes (Signed)
I have reviewed this encounter including the documentation in this note and/or discussed this patient with the provider, Wilhelmina McardleLauren Kennedy, AGPCNP-BC. I am certifying that I agree with the content of this note as supervising physician.  Saralyn PilarAlexander Rhyatt Muska, DO [facility] 08/25/2016, 12:12 PM

## 2016-08-28 ENCOUNTER — Emergency Department: Payer: Medicare HMO

## 2016-08-28 ENCOUNTER — Inpatient Hospital Stay
Admission: EM | Admit: 2016-08-28 | Discharge: 2016-08-30 | DRG: 312 | Disposition: A | Discharge status: Home Health | Payer: Medicare HMO | Attending: Specialist | Admitting: Specialist

## 2016-08-28 ENCOUNTER — Encounter: Payer: Self-pay | Admitting: Emergency Medicine

## 2016-08-28 ENCOUNTER — Telehealth: Payer: Self-pay

## 2016-08-28 ENCOUNTER — Observation Stay: Payer: Medicare HMO

## 2016-08-28 DIAGNOSIS — M545 Low back pain: Secondary | ICD-10-CM | POA: Diagnosis present

## 2016-08-28 DIAGNOSIS — Z8249 Family history of ischemic heart disease and other diseases of the circulatory system: Secondary | ICD-10-CM | POA: Diagnosis not present

## 2016-08-28 DIAGNOSIS — Z87891 Personal history of nicotine dependence: Secondary | ICD-10-CM | POA: Diagnosis not present

## 2016-08-28 DIAGNOSIS — E860 Dehydration: Secondary | ICD-10-CM | POA: Diagnosis not present

## 2016-08-28 DIAGNOSIS — E2740 Unspecified adrenocortical insufficiency: Secondary | ICD-10-CM | POA: Diagnosis not present

## 2016-08-28 DIAGNOSIS — N390 Urinary tract infection, site not specified: Secondary | ICD-10-CM | POA: Diagnosis not present

## 2016-08-28 DIAGNOSIS — R739 Hyperglycemia, unspecified: Secondary | ICD-10-CM

## 2016-08-28 DIAGNOSIS — F419 Anxiety disorder, unspecified: Secondary | ICD-10-CM | POA: Diagnosis present

## 2016-08-28 DIAGNOSIS — K5900 Constipation, unspecified: Secondary | ICD-10-CM

## 2016-08-28 DIAGNOSIS — R296 Repeated falls: Secondary | ICD-10-CM | POA: Diagnosis not present

## 2016-08-28 DIAGNOSIS — F329 Major depressive disorder, single episode, unspecified: Secondary | ICD-10-CM | POA: Diagnosis present

## 2016-08-28 DIAGNOSIS — Z96642 Presence of left artificial hip joint: Secondary | ICD-10-CM | POA: Diagnosis present

## 2016-08-28 DIAGNOSIS — Z8261 Family history of arthritis: Secondary | ICD-10-CM

## 2016-08-28 DIAGNOSIS — I959 Hypotension, unspecified: Secondary | ICD-10-CM | POA: Diagnosis not present

## 2016-08-28 DIAGNOSIS — R634 Abnormal weight loss: Secondary | ICD-10-CM | POA: Diagnosis present

## 2016-08-28 DIAGNOSIS — Z820 Family history of epilepsy and other diseases of the nervous system: Secondary | ICD-10-CM | POA: Diagnosis not present

## 2016-08-28 DIAGNOSIS — E871 Hypo-osmolality and hyponatremia: Secondary | ICD-10-CM

## 2016-08-28 DIAGNOSIS — G894 Chronic pain syndrome: Secondary | ICD-10-CM | POA: Diagnosis present

## 2016-08-28 DIAGNOSIS — K219 Gastro-esophageal reflux disease without esophagitis: Secondary | ICD-10-CM | POA: Diagnosis not present

## 2016-08-28 DIAGNOSIS — R112 Nausea with vomiting, unspecified: Secondary | ICD-10-CM

## 2016-08-28 DIAGNOSIS — I10 Essential (primary) hypertension: Secondary | ICD-10-CM | POA: Diagnosis present

## 2016-08-28 DIAGNOSIS — R4182 Altered mental status, unspecified: Secondary | ICD-10-CM | POA: Diagnosis not present

## 2016-08-28 DIAGNOSIS — Z800 Family history of malignant neoplasm of digestive organs: Secondary | ICD-10-CM

## 2016-08-28 DIAGNOSIS — I951 Orthostatic hypotension: Secondary | ICD-10-CM

## 2016-08-28 DIAGNOSIS — E785 Hyperlipidemia, unspecified: Secondary | ICD-10-CM | POA: Diagnosis not present

## 2016-08-28 DIAGNOSIS — Z823 Family history of stroke: Secondary | ICD-10-CM | POA: Diagnosis not present

## 2016-08-28 DIAGNOSIS — R32 Unspecified urinary incontinence: Secondary | ICD-10-CM | POA: Diagnosis present

## 2016-08-28 DIAGNOSIS — R627 Adult failure to thrive: Secondary | ICD-10-CM

## 2016-08-28 DIAGNOSIS — Z6827 Body mass index (BMI) 27.0-27.9, adult: Secondary | ICD-10-CM

## 2016-08-28 DIAGNOSIS — K56609 Unspecified intestinal obstruction, unspecified as to partial versus complete obstruction: Secondary | ICD-10-CM | POA: Diagnosis not present

## 2016-08-28 DIAGNOSIS — R531 Weakness: Secondary | ICD-10-CM | POA: Diagnosis not present

## 2016-08-28 HISTORY — DX: Adult failure to thrive: R62.7

## 2016-08-28 HISTORY — DX: Orthostatic hypotension: I95.1

## 2016-08-28 LAB — COMPREHENSIVE METABOLIC PANEL
ALT: 16 U/L (ref 14–54)
ANION GAP: 6 (ref 5–15)
AST: 29 U/L (ref 15–41)
Albumin: 2.3 g/dL — ABNORMAL LOW (ref 3.5–5.0)
Alkaline Phosphatase: 103 U/L (ref 38–126)
BUN: 7 mg/dL (ref 6–20)
CO2: 26 mmol/L (ref 22–32)
CREATININE: 0.68 mg/dL (ref 0.44–1.00)
Calcium: 8.7 mg/dL — ABNORMAL LOW (ref 8.9–10.3)
Chloride: 100 mmol/L — ABNORMAL LOW (ref 101–111)
Glucose, Bld: 106 mg/dL — ABNORMAL HIGH (ref 65–99)
Potassium: 4.5 mmol/L (ref 3.5–5.1)
Sodium: 132 mmol/L — ABNORMAL LOW (ref 135–145)
Total Bilirubin: 0.4 mg/dL (ref 0.3–1.2)
Total Protein: 5.8 g/dL — ABNORMAL LOW (ref 6.5–8.1)

## 2016-08-28 LAB — TSH: TSH: 2.504 u[IU]/mL (ref 0.350–4.500)

## 2016-08-28 LAB — CBC
HCT: 29.7 % — ABNORMAL LOW (ref 35.0–47.0)
Hemoglobin: 10.1 g/dL — ABNORMAL LOW (ref 12.0–16.0)
MCH: 30.9 pg (ref 26.0–34.0)
MCHC: 34.1 g/dL (ref 32.0–36.0)
MCV: 90.6 fL (ref 80.0–100.0)
Platelets: 326 10*3/uL (ref 150–440)
RBC: 3.27 MIL/uL — AB (ref 3.80–5.20)
RDW: 15 % — AB (ref 11.5–14.5)
WBC: 4.8 10*3/uL (ref 3.6–11.0)

## 2016-08-28 LAB — URINALYSIS, COMPLETE (UACMP) WITH MICROSCOPIC
BACTERIA UA: NONE SEEN
Bilirubin Urine: NEGATIVE
Glucose, UA: NEGATIVE mg/dL
Hgb urine dipstick: NEGATIVE
Ketones, ur: NEGATIVE mg/dL
NITRITE: NEGATIVE
PROTEIN: NEGATIVE mg/dL
Specific Gravity, Urine: 1.018 (ref 1.005–1.030)
pH: 5 (ref 5.0–8.0)

## 2016-08-28 LAB — APTT: APTT: 35 s (ref 24–36)

## 2016-08-28 LAB — DIFFERENTIAL
BASOS ABS: 0 10*3/uL (ref 0–0.1)
BASOS PCT: 0 %
EOS ABS: 0.1 10*3/uL (ref 0–0.7)
Eosinophils Relative: 2 %
LYMPHS PCT: 20 %
Lymphs Abs: 0.9 10*3/uL — ABNORMAL LOW (ref 1.0–3.6)
MONO ABS: 0.4 10*3/uL (ref 0.2–0.9)
Monocytes Relative: 8 %
NEUTROS ABS: 3.4 10*3/uL (ref 1.4–6.5)
NEUTROS PCT: 70 %

## 2016-08-28 LAB — PROTIME-INR
INR: 1.01
Prothrombin Time: 13.3 seconds (ref 11.4–15.2)

## 2016-08-28 LAB — CK: CK TOTAL: 285 U/L — AB (ref 38–234)

## 2016-08-28 LAB — VALPROIC ACID LEVEL: VALPROIC ACID LVL: 50 ug/mL (ref 50.0–100.0)

## 2016-08-28 LAB — TROPONIN I

## 2016-08-28 LAB — LACTIC ACID, PLASMA: Lactic Acid, Venous: 1.5 mmol/L (ref 0.5–1.9)

## 2016-08-28 LAB — CORTISOL: Cortisol, Plasma: 7.7 ug/dL

## 2016-08-28 MED ORDER — ALBUTEROL SULFATE HFA 108 (90 BASE) MCG/ACT IN AERS: 2.0000 | INHALATION_SPRAY | Freq: Four times a day (QID) | RESPIRATORY_TRACT | Status: DC | PRN

## 2016-08-28 MED ORDER — OLOPATADINE HCL 0.1 % OP SOLN
1.0000 [drp] | Freq: Two times a day (BID) | OPHTHALMIC | Status: DC
  Administered 2016-08-28 – 2016-08-30 (×4): 1 [drp] via OPHTHALMIC
  Filled 2016-08-28: qty 5

## 2016-08-28 MED ORDER — OXYBUTYNIN CHLORIDE ER 5 MG PO TB24
15.0000 mg | ORAL_TABLET | Freq: Two times a day (BID) | ORAL | Status: DC
  Administered 2016-08-28 – 2016-08-30 (×4): 15 mg via ORAL
  Filled 2016-08-28 (×4): qty 3

## 2016-08-28 MED ORDER — DEXTROSE 5 % IV SOLN
1.0000 g | INTRAVENOUS | Status: DC
  Administered 2016-08-28 – 2016-08-29 (×2): 1 g via INTRAVENOUS
  Filled 2016-08-28 (×2): qty 10

## 2016-08-28 MED ORDER — ACETAMINOPHEN 325 MG PO TABS
650.0000 mg | ORAL_TABLET | Freq: Four times a day (QID) | ORAL | Status: DC | PRN
  Administered 2016-08-29: 650 mg via ORAL
  Filled 2016-08-28: qty 2

## 2016-08-28 MED ORDER — ASPIRIN EC 81 MG PO TBEC
81.0000 mg | DELAYED_RELEASE_TABLET | Freq: Every day | ORAL | Status: DC
  Administered 2016-08-29 – 2016-08-30 (×2): 81 mg via ORAL
  Filled 2016-08-28 (×2): qty 1

## 2016-08-28 MED ORDER — SODIUM CHLORIDE 0.9 % IV BOLUS (SEPSIS)
1000.0000 mL | Freq: Once | INTRAVENOUS | Status: AC
  Administered 2016-08-28: 1000 mL via INTRAVENOUS

## 2016-08-28 MED ORDER — ENOXAPARIN SODIUM 40 MG/0.4ML ~~LOC~~ SOLN
40.0000 mg | SUBCUTANEOUS | Status: DC
  Administered 2016-08-28 – 2016-08-29 (×2): 40 mg via SUBCUTANEOUS
  Filled 2016-08-28 (×2): qty 0.4

## 2016-08-28 MED ORDER — SIMVASTATIN 20 MG PO TABS
40.0000 mg | ORAL_TABLET | Freq: Every day | ORAL | Status: DC
  Administered 2016-08-28 – 2016-08-29 (×2): 40 mg via ORAL
  Filled 2016-08-28 (×3): qty 2

## 2016-08-28 MED ORDER — PANTOPRAZOLE SODIUM 40 MG PO TBEC
40.0000 mg | DELAYED_RELEASE_TABLET | Freq: Every day | ORAL | Status: DC
  Administered 2016-08-28 – 2016-08-30 (×4): 40 mg via ORAL
  Filled 2016-08-28 (×3): qty 1

## 2016-08-28 MED ORDER — HYDROCODONE-ACETAMINOPHEN 5-325 MG PO TABS
1.0000 | ORAL_TABLET | Freq: Two times a day (BID) | ORAL | Status: DC
  Administered 2016-08-28 – 2016-08-29 (×2): 1 via ORAL
  Filled 2016-08-28 (×2): qty 1

## 2016-08-28 MED ORDER — ACETAMINOPHEN 650 MG RE SUPP: 650.0000 mg | Freq: Four times a day (QID) | RECTAL | Status: DC | PRN

## 2016-08-28 MED ORDER — ONDANSETRON HCL 4 MG/2ML IJ SOLN: 4.0000 mg | Freq: Four times a day (QID) | INTRAMUSCULAR | Status: DC | PRN

## 2016-08-28 MED ORDER — POLYETHYLENE GLYCOL 3350 17 G PO PACK
1.0000 | PACK | Freq: Every day | ORAL | Status: DC
  Administered 2016-08-29 – 2016-08-30 (×2): 17 g via ORAL
  Filled 2016-08-28 (×2): qty 1

## 2016-08-28 MED ORDER — ONDANSETRON HCL 4 MG PO TABS: 4.0000 mg | ORAL_TABLET | Freq: Four times a day (QID) | ORAL | Status: DC | PRN

## 2016-08-28 MED ORDER — PANTOPRAZOLE SODIUM 40 MG PO TBEC
DELAYED_RELEASE_TABLET | ORAL | Status: AC
  Administered 2016-08-28: 40 mg via ORAL
  Filled 2016-08-28: qty 1

## 2016-08-28 MED ORDER — ALBUTEROL SULFATE (2.5 MG/3ML) 0.083% IN NEBU: 2.5000 mg | INHALATION_SOLUTION | Freq: Four times a day (QID) | RESPIRATORY_TRACT | Status: DC | PRN

## 2016-08-28 MED ORDER — CEFTRIAXONE SODIUM 1 G IJ SOLR
INTRAMUSCULAR | Status: AC
  Administered 2016-08-28: 1 g via INTRAVENOUS
  Filled 2016-08-28: qty 10

## 2016-08-28 MED ORDER — SENNOSIDES-DOCUSATE SODIUM 8.6-50 MG PO TABS
2.0000 | ORAL_TABLET | Freq: Two times a day (BID) | ORAL | Status: DC
  Administered 2016-08-28 – 2016-08-30 (×4): 2 via ORAL
  Filled 2016-08-28 (×4): qty 2

## 2016-08-28 MED ORDER — SERTRALINE HCL 100 MG PO TABS
200.0000 mg | ORAL_TABLET | Freq: Every day | ORAL | Status: DC
  Administered 2016-08-29 – 2016-08-30 (×2): 200 mg via ORAL
  Filled 2016-08-28: qty 2
  Filled 2016-08-28 (×2): qty 4
  Filled 2016-08-28: qty 2

## 2016-08-28 MED ORDER — MONTELUKAST SODIUM 10 MG PO TABS
10.0000 mg | ORAL_TABLET | Freq: Every day | ORAL | Status: DC
  Administered 2016-08-28 – 2016-08-29 (×2): 10 mg via ORAL
  Filled 2016-08-28 (×2): qty 1

## 2016-08-28 MED ORDER — HYDROCODONE-ACETAMINOPHEN 5-325 MG PO TABS
1.0000 | ORAL_TABLET | ORAL | Status: AC
  Administered 2016-08-28: 1 via ORAL
  Filled 2016-08-28: qty 1

## 2016-08-28 MED ORDER — DIVALPROEX SODIUM 500 MG PO DR TAB
500.0000 mg | DELAYED_RELEASE_TABLET | Freq: Two times a day (BID) | ORAL | Status: DC
  Administered 2016-08-28 – 2016-08-30 (×3): 500 mg via ORAL
  Filled 2016-08-28 (×5): qty 1

## 2016-08-28 MED ORDER — HYDROCORTISONE NA SUCCINATE PF 100 MG IJ SOLR: 50.0000 mg | Freq: Once | INTRAMUSCULAR | Status: DC

## 2016-08-28 MED ORDER — AMITRIPTYLINE HCL 50 MG PO TABS
50.0000 mg | ORAL_TABLET | Freq: Every day | ORAL | Status: DC
  Administered 2016-08-28 – 2016-08-29 (×2): 50 mg via ORAL
  Filled 2016-08-28 (×3): qty 1

## 2016-08-28 MED ORDER — DOCUSATE SODIUM 100 MG PO CAPS
100.0000 mg | ORAL_CAPSULE | Freq: Two times a day (BID) | ORAL | Status: DC
  Administered 2016-08-28 – 2016-08-29 (×2): 100 mg via ORAL
  Filled 2016-08-28 (×2): qty 1

## 2016-08-28 NOTE — ED Triage Notes (Signed)
Pt from home with increasing weakness, ams after falling on Monday, Tuesday, and Wednesday. Son states that she fell several times on Tuesday; slept all day yesterday. Pt is alert, oriented, responsive. Son states her activity level is decreased.

## 2016-08-28 NOTE — H&P (Signed)
[facility] [place] [facility]   PATIENT NAME: Kelly Foster    MR#:  [id number]  DATE OF BIRTH:  01/13/1951  DATE OF ADMISSION:  08/28/2016  PRIMARY CARE PHYSICIAN: Galen ManilaKennedy, Lauren Renee, NP   REQUESTING/REFERRING PHYSICIAN:   CHIEF COMPLAINT:   Chief Complaint  Patient presents with  . Altered Mental Status  . Fall    HISTORY OF PRESENT ILLNESS: Kelly Mareseggy Kolasa  is a 66 y.o. female with a known history of Chronic bilateral lower back pain without sciatica, chronic pain syndrome, as arthritis of the hip, spinal stenosis, adrenal insufficiency, bipolar disorder, depression, hypertension, hyperlipidemia, hypothyroidism, hyponatremia, who presents to the hospital with complaints of multiple falls, poor oral intake, nausea and vomiting for the past 1-2 weeks, weight loss. I will continue the patient, she has been having dizziness and feeling presyncopal. Each time she stands up, she is having nausea and vomiting and very poor appetite over the past 2 weeks and approximately 15 pound weight loss over the same period of time. She denies any black or tarry looking stool, however, admits of constipation read. She admits of increased frequency of urination, and some incontinence, which has improved with Ditropan. On arrival to emergency room, she was noted to be hypotensive, systolic blood pressure in 80s, she was also orthostatically hypotensive. She was given IV fluids and hospitalist services were contacted for admission.     PAST MEDICAL HISTORY:   Past Medical History:  Diagnosis Date  . Adrenal insufficiency (HCC)   . Allergy   . Anxiety   . Arthritis   . Bipolar 1 disorder (HCC)   . Candidiasis of mouth and esophagus (HCC) 08/13/2016  . Central spinal stenosis   . Cervical radiculopathy, chronic   . Chronic adrenal insufficiency (HCC) 06/06/2013  . Chronic low back pain without sciatica   . Chronic pain syndrome   . Depression   . Disorder of left  sacroiliac joint   . DJD (degenerative joint disease)   . Elevated CK   . Hemorrhoids 07/11/2016  . Hip pain   . History of kidney stones   . Hyperlipidemia   . Hypertension   . Hyponatremia   . Hypothyroidism   . Lower urinary tract infection 06/06/2013   Overview:  IMO Problem List Replacer Jan. 2016   . Lumbar radiculitis   . Manic depressive disorder (HCC)   . Neuritis or radiculitis due to rupture of lumbar intervertebral disc 05/29/2014  . OA (osteoarthritis) of hip   . Overweight   . Prediabetes   . Reflux esophagitis   . Sacrococcygeal disorders, not elsewhere classified   . Sacroiliac joint dysfunction 04/05/2015  . Trochanteric bursitis of left hip   . Ulnar neuropathy   . Urinary frequency   . Vaginal atrophy     PAST SURGICAL HISTORY: Past Surgical History:  Procedure Laterality Date  . ABDOMINAL HYSTERECTOMY    . arm surgery Left   . CESAREAN SECTION    . JOINT REPLACEMENT Left 2016   dec  . TOTAL HIP ARTHROPLASTY Left 12/26/2014   Procedure: TOTAL HIP ARTHROPLASTY ANTERIOR APPROACH;  Surgeon: Kennedy BuckerMichael Menz, MD;  Location: [facility] ORS;  Service: Orthopedics;  Laterality: Left;    SOCIAL HISTORY:  Social History  Substance Use Topics  . Smoking status: Former Smoker    Packs/day: 1.00    Types: Cigarettes    Quit date: 03/01/1995  . Smokeless tobacco: Never Used     Comment: quit 1998  .  Alcohol use No    FAMILY HISTORY:  Family History  Problem Relation Age of Onset  . Alzheimer's disease Mother   . Arthritis Mother   . Stroke Father   . Hypertension Father   . Colon cancer Sister   . Hypertension Sister   . Kidney disease Neg Hx     DRUG ALLERGIES:  Allergies  Allergen Reactions  . Gabapentin Other (See Comments)    tremors tremors  . Codeine Itching  . Pregabalin Other (See Comments)    Left side pain under arm, started after use of lyrica  . Tramadol Itching    Review of Systems  Constitutional: Positive for weight loss. Negative for  chills and fever.  HENT: Negative for congestion.   Eyes: Positive for blurred vision. Negative for double vision.  Respiratory: Negative for cough, sputum production, shortness of breath and wheezing.   Cardiovascular: Negative for chest pain, palpitations, orthopnea, leg swelling and PND.  Gastrointestinal: Positive for constipation, nausea and vomiting. Negative for abdominal pain, blood in stool and diarrhea.  Genitourinary: Positive for frequency. Negative for dysuria, hematuria and urgency.  Musculoskeletal: Positive for falls and joint pain.  Neurological: Negative for dizziness, tremors, focal weakness and headaches.  Endo/Heme/Allergies: Does not bruise/bleed easily.  Psychiatric/Behavioral: Negative for depression. The patient does not have insomnia.     MEDICATIONS AT HOME:  Prior to Admission medications   Medication Sig Start Date End Date Taking? Authorizing Provider  amitriptyline (ELAVIL) 50 MG tablet Take 50 mg by mouth at bedtime.   Yes [provider]  aspirin 81 MG tablet Take 1 tablet (81 mg total) by mouth daily. 07/23/16  Yes Galen Manila, NP  Calcium Carbonate-Vitamin D3 (CALCIUM 600-D) 600-400 MG-UNIT TABS Take 1 tablet by mouth daily.   Yes [provider]  divalproex (DEPAKOTE) 500 MG DR tablet Take 500 mg by mouth 2 (two) times daily.   Yes [provider]  HYDROcodone-acetaminophen (NORCO/VICODIN) 5-325 MG tablet Take 1 tablet by mouth 2 (two) times daily.   Yes [provider]  montelukast (SINGULAIR) 10 MG tablet Take 1 tablet (10 mg total) by mouth at bedtime. Reported on 04/05/2015 07/23/16  Yes Galen Manila, NP  Multiple Vitamin (MULTIVITAMIN) tablet Take 1 tablet by mouth daily.   Yes [provider]  Olopatadine HCl 0.2 % SOLN Place 1 drop into both eyes daily. 07/14/16  Yes Galen Manila, NP  oxybutynin (DITROPAN XL) 15 MG 24 hr tablet Take one in the am and one in the pm 10/29/15  Yes  McGowan, Carollee Herter A, PA-C  sertraline (ZOLOFT) 100 MG tablet Take 200 mg by mouth daily.    Yes [provider]  simvastatin (ZOCOR) 40 MG tablet Take 40 mg by mouth at bedtime.    Yes [provider]  acyclovir (ZOVIRAX) 400 MG tablet Take 400 mg by mouth 3 (three) times daily as needed (for flares).    [provider]  albuterol (PROVENTIL HFA;VENTOLIN HFA) 108 (90 Base) MCG/ACT inhaler Inhale 2 puffs into the lungs every 6 (six) hours as needed for wheezing or shortness of breath. 08/21/16   Galen Manila, NP  ARTIFICIAL TEARS 0.1-0.3 % SOLN Administer 1 drop to both eyes 4 (four) times a day as needed. 06/06/13   [provider]  bisacodyl (DULCOLAX) 5 MG EC tablet Take 5 mg by mouth at bedtime as needed for moderate constipation. Patient is taking 2 tabs.    [provider]  hydrocortisone (PROCTOSOL  HC) 2.5 % rectal cream  06/26/15   [provider]  ondansetron (ZOFRAN) 4 MG tablet Take 1 tablet by mouth every 8 (eight) hours as needed. 08/12/16   [provider]  polyethylene glycol powder (GLYCOLAX/MIRALAX) powder 1 cap full in a full glass of water, two times a day for 3 days if you have not had a bowel movement in 3 days. 08/21/16   Galen Manila, NP  RA LAXATIVE & STOOL SOFTENER 8.6-50 MG tablet Take 2 tablets by mouth 2 (two) times daily. 08/14/16   [provider]  Spacer/Aero Chamber Mouthpiece MISC by Does not apply route. 04/29/14   [provider]      PHYSICAL EXAMINATION:   VITAL SIGNS: Blood pressure 127/73, pulse 84, temperature 98 F (36.7 C), temperature source Oral, resp. rate 17, height 5\' 7"  (1.702 m), weight 81.6 kg (180 lb), SpO2 99 %.  GENERAL:  66 y.o.-year-old patient lying in the bed with no acute distress. Dental less, indicating the lips constantly, dry oral mucosa EYES: Pupils equal, round, reactive to light and accommodation. No scleral icterus. Extraocular muscles intact.   HEENT: Head atraumatic, normocephalic. Oropharynx and nasopharynx clear.  NECK:  Supple, no jugular venous distention. No thyroid enlargement, no tenderness.  LUNGS: Normal breath sounds bilaterally, no wheezing, rales,rhonchi or crepitation. No use of accessory muscles of respiration.  CARDIOVASCULAR: S1, S2 normal. No murmurs, rubs, or gallops.  ABDOMEN: Soft, nontender, nondistended. Bowel sounds present. No organomegaly or mass.  EXTREMITIES: No pedal edema, cyanosis, or clubbing.  NEUROLOGIC: Cranial nerves II through XII are intact. Muscle strength 5/5 in all extremities. Sensation intact. Gait not checked.  PSYCHIATRIC: The patient is alert and oriented x 3.  SKIN: No obvious rash, lesion, or ulcer.   LABORATORY PANEL:   CBC  Recent Labs Lab 08/28/16 1238  WBC 4.8  HGB 10.1*  HCT 29.7*  PLT 326  MCV 90.6  MCH 30.9  MCHC 34.1  RDW 15.0*  LYMPHSABS 0.9*  MONOABS 0.4  EOSABS 0.1  BASOSABS 0.0   ------------------------------------------------------------------------------------------------------------------  Chemistries   Recent Labs Lab 08/28/16 1238  NA 132*  K 4.5  CL 100*  CO2 26  GLUCOSE 106*  BUN 7  CREATININE 0.68  CALCIUM 8.7*  AST 29  ALT 16  ALKPHOS 103  BILITOT 0.4   ------------------------------------------------------------------------------------------------------------------  Cardiac Enzymes  Recent Labs Lab 08/28/16 1238  TROPONINI <0.03   ------------------------------------------------------------------------------------------------------------------  RADIOLOGY: Ct Head Wo Contrast  Result Date: 08/28/2016 CLINICAL DATA:  Increasing weakness. EXAM: CT HEAD WITHOUT CONTRAST TECHNIQUE: Contiguous axial images were obtained from the base of the skull through the vertex without intravenous contrast. COMPARISON:  MRI of the head 02/24/2014 FINDINGS: Brain: No evidence of acute infarction, hemorrhage, hydrocephalus, extra-axial  collection or mass lesion/mass effect. Moderate brain parenchymal volume loss and mild periventricular microangiopathy. Vascular: No hyperdense vessel or unexpected calcification. Skull: Normal. Negative for fracture or focal lesion. Sinuses/Orbits: No acute finding. Other: None. IMPRESSION: No acute intracranial abnormality. Moderate brain parenchymal atrophy and mild chronic microvascular disease. Electronically Signed   By: Ted Mcalpine M.D.   On: 08/28/2016 13:08   Dg Chest Portable 1 View  Result Date: 08/28/2016 CLINICAL DATA:  Altered mental status EXAM: PORTABLE CHEST 1 VIEW COMPARISON:  None. FINDINGS: The heart size and mediastinal contours are within normal limits. Both lungs are clear. The visualized skeletal structures are unremarkable. IMPRESSION: No active disease. Electronically Signed   By: Deatra Robinson M.D.   On: 08/28/2016 17:04  Dg Knee Complete 4 Views Left  Result Date: 08/28/2016 CLINICAL DATA:  Diffuse left knee pain.  Multiple falls. EXAM: LEFT KNEE - COMPLETE 4+ VIEW COMPARISON:  None. FINDINGS: No evidence of fracture, or dislocation. Small suprapatellar joint effusion. Three compartment osteoarthritic changes with joint space narrowing, sub chondral sclerosis and mild osteophytosis. Normal soft tissues. IMPRESSION: Small suprapatellar joint effusion. Three compartment osteoarthritic changes, mild-to-moderate. Electronically Signed   By: Ted Mcalpineobrinka  Dimitrova M.D.   On: 08/28/2016 18:16    EKG: Orders placed or performed during the hospital encounter of 08/28/16  . ED EKG  . ED EKG  . EKG 12-Lead  . EKG 12-Lead  EKG in the emergency room revealed normal sinus rhythm at 69 with permanent normal axis -, no acute ST-T changes  IMPRESSION AND PLAN:  Active Problems:   Failure to thrive in adult   Orthostatic hypotension   Hyponatremia   Hyperglycemia   Pyuria  #1. Falls, likely due to orthostatic hypotension, get physical, occupational therapist consultation  after rehydration.  #2. Orthostatic hypotension of unclear etiology, likely due to intermittent nausea and vomiting and poor appetite/dehydration, initiate IV fluids, follow orthostatic blood pressure readings every shift #3. Hyponatremia, likely due to dehydration, follow sodium level in the morning, get cortisol level stat #4. Hyperglycemia, get hemoglobin A1c to rule out diabetes and the reason for orthostatic hypotension/dehydration #5. Pyuria, concerning for rheumatoid infection, getting urinary cultures, initiate patient on Rocephin #6. Failure to thrive adult, nausea, vomiting, initiate patient on Protonix, initiate clear liquid diet, advance diet as tolerated, get KUB to rule out pseudoobstruction/constipation/fecal impaction  All the records are reviewed and case discussed with ED provider. Management plans discussed with the patient, family and they are in agreement.  CODE STATUS: Code Status History    Date Active Date Inactive Code Status Order ID Comments User Context   12/26/2014  3:56 PM 12/28/2014  5:49 PM Full Code [id number]  Kennedy BuckerMenz, Michael, MD Inpatient   12/20/2014  9:04 PM 12/26/2014  3:56 PM Full Code [id number]  Hower, Cletis Athensavid K, MD ED       TOTAL TIME TAKING CARE OF THIS PATIENT: 55 minutes.    Katharina CaperVAICKUTE,Janyah Singleterry M.D on 08/28/2016 at 7:23 PM  Between 7am to 6pm - Pager - [phone] After 6pm go to [email or web address] - password [email or web address] [facility]  [facility]  Office  [phone]  CC: Primary care physician; Galen ManilaKennedy, Lauren Renee, NP

## 2016-08-28 NOTE — ED Notes (Signed)
Lab called and notified of need of phlebotomist for blood draw. States they will send someone down.

## 2016-08-28 NOTE — Telephone Encounter (Signed)
Kelly Foster called from [facility] requesting a sick visit today. The  pt fell on Monday w/ no visibile injuries, but now is having difficulty walking, disoriented, fatigue and weakness. I spoke w/ Leotis ShamesLauren and she recommended that the patient go to the ER to be evaluated. Kelly Foster verbalize understanding, no questions or concerns.

## 2016-08-28 NOTE — ED Provider Notes (Signed)
[facility] Emergency Department Provider Note   ____________________________________________   First MD Initiated Contact with Patient 08/28/16 1642     (approximate)  I have reviewed the triage vital signs and the nursing notes.   HISTORY  Chief Complaint Altered Mental Status and Fall   HPI Kelly Foster is a 65 y.o. female here for evaluation of multiple falls weakness and confusion  Family reports that since Monday the patient has been falling further: Whenever she is standing. Monday she had one fall, EMS was called. On Tuesday she fell twice both times while trying to stand up. She been very weak. She stayed in bed all of yesterday. Today she again was sitting at the table and started to slump over. She did not lose consciousness.  Patient reports that she is not having any pain except for left knee has been aching for the falls. She has a history of a previous hip replacement was not causing problems. Denies any numbness or tingling. Denies weakness in any one arm or leg. She doesn't remember much for the last day and a half, but reports that she feels like she's been just out of it and very weak and tired. Seems to be a lot worse when she tries to stand or walk.  No new medication changes. Takes 1 hydrocodone daily for pain.  Past Medical History:  Diagnosis Date  . Adrenal insufficiency (HCC)   . Allergy   . Anxiety   . Arthritis   . Bipolar 1 disorder (HCC)   . Candidiasis of mouth and esophagus (HCC) 08/13/2016  . Central spinal stenosis   . Cervical radiculopathy, chronic   . Chronic adrenal insufficiency (HCC) 06/06/2013  . Chronic low back pain without sciatica   . Chronic pain syndrome   . Depression   . Disorder of left sacroiliac joint   . DJD (degenerative joint disease)   . Elevated CK   . Hemorrhoids 07/11/2016  . Hip pain   . History of kidney stones   . Hyperlipidemia   . Hypertension   . Hyponatremia   . Hypothyroidism    . Lower urinary tract infection 06/06/2013   Overview:  IMO Problem List Replacer Jan. 2016   . Lumbar radiculitis   . Manic depressive disorder (HCC)   . Neuritis or radiculitis due to rupture of lumbar intervertebral disc 05/29/2014  . OA (osteoarthritis) of hip   . Overweight   . Prediabetes   . Reflux esophagitis   . Sacrococcygeal disorders, not elsewhere classified   . Sacroiliac joint dysfunction 04/05/2015  . Trochanteric bursitis of left hip   . Ulnar neuropathy   . Urinary frequency   . Vaginal atrophy     Patient Active Problem List   Diagnosis Date Noted  . Protein malnutrition (HCC) 08/13/2016  . Hemorrhoids 07/11/2016  . Hyperlipidemia   . Depression   . Central spinal stenosis   . Cervical radiculopathy, chronic   . OA (osteoarthritis) of hip   . Chronic pain syndrome   . Chronic low back pain without sciatica   . Hypothyroidism   . Reflux esophagitis   . Sacroiliac joint dysfunction 04/05/2015  . 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 Intractable pain 12/20/2014  . Neuritis or radiculitis due to rupture of lumbar intervertebral disc 05/29/2014  . Lumbar radiculitis 05/29/2014  . Bipolar 1 disorder, mixed (HCC) 06/06/2013  . Chronic adrenal insufficiency (HCC) 06/06/2013  . Elevated CK 06/06/2013  . Below  normal amount of sodium in the blood 06/06/2013  . Adynamia 06/06/2013    Past Surgical History:  Procedure Laterality Date  . ABDOMINAL HYSTERECTOMY    . arm surgery Left   . CESAREAN SECTION    . JOINT REPLACEMENT Left 2016   dec  . TOTAL HIP ARTHROPLASTY Left 12/26/2014   Procedure: TOTAL HIP ARTHROPLASTY ANTERIOR APPROACH;  Surgeon: Kennedy BuckerMichael Menz, MD;  Location: [facility] ORS;  Service: Orthopedics;  Laterality: Left;    Prior to Admission medications   Medication Sig Start Date End Date Taking? Authorizing Provider  amitriptyline (ELAVIL) 50 MG tablet Take 50 mg by mouth at bedtime.   Yes [provider]  aspirin 81 MG  tablet Take 1 tablet (81 mg total) by mouth daily. 07/23/16  Yes Galen ManilaKennedy, Lauren Renee, NP  Calcium Carbonate-Vitamin D3 (CALCIUM 600-D) 600-400 MG-UNIT TABS Take 1 tablet by mouth daily.   Yes [provider]  divalproex (DEPAKOTE) 500 MG DR tablet Take 500 mg by mouth 2 (two) times daily.   Yes [provider]  HYDROcodone-acetaminophen (NORCO/VICODIN) 5-325 MG tablet Take 1 tablet by mouth 2 (two) times daily.   Yes [provider]  montelukast (SINGULAIR) 10 MG tablet Take 1 tablet (10 mg total) by mouth at bedtime. Reported on 04/05/2015 07/23/16  Yes Galen ManilaKennedy, Lauren Renee, NP  Multiple Vitamin (MULTIVITAMIN) tablet Take 1 tablet by mouth daily.   Yes [provider]  Olopatadine HCl 0.2 % SOLN Place 1 drop into both eyes daily. 07/14/16  Yes Galen ManilaKennedy, Lauren Renee, NP  oxybutynin (DITROPAN XL) 15 MG 24 hr tablet Take one in the am and one in the pm 10/29/15  Yes McGowan, Carollee HerterShannon A, PA-C  sertraline (ZOLOFT) 100 MG tablet Take 200 mg by mouth daily.    Yes [provider]  simvastatin (ZOCOR) 40 MG tablet Take 40 mg by mouth at bedtime.    Yes [provider]  acyclovir (ZOVIRAX) 400 MG tablet Take 400 mg by mouth 3 (three) times daily as needed (for flares).    [provider]  albuterol (PROVENTIL HFA;VENTOLIN HFA) 108 (90 Base) MCG/ACT inhaler Inhale 2 puffs into the lungs every 6 (six) hours as needed for wheezing or shortness of breath. 08/21/16   Galen ManilaKennedy, Lauren Renee, NP  ARTIFICIAL TEARS 0.1-0.3 % SOLN Administer 1 drop to both eyes 4 (four) times a day as needed. 06/06/13   [provider]  bisacodyl (DULCOLAX) 5 MG EC tablet Take 5 mg by mouth at bedtime as needed for moderate constipation. Patient is taking 2 tabs.    [provider]  hydrocortisone (PROCTOSOL HC) 2.5 % rectal cream  06/26/15   [provider]  ondansetron (ZOFRAN) 4 MG tablet Take 1 tablet by mouth every 8 (eight) hours as needed. 08/12/16    [provider]  polyethylene glycol powder (GLYCOLAX/MIRALAX) powder 1 cap full in a full glass of water, two times a day for 3 days if you have not had a bowel movement in 3 days. 08/21/16   Galen ManilaKennedy, Lauren Renee, NP  RA LAXATIVE & STOOL SOFTENER 8.6-50 MG tablet Take 2 tablets by mouth 2 (two) times daily. 08/14/16   [provider]  Spacer/Aero Chamber Mouthpiece MISC by Does not apply route. 04/29/14   [provider]    Allergies Gabapentin; Codeine; Pregabalin; and Tramadol  Family History  Problem Relation Age of Onset  . Alzheimer's disease Mother   . Arthritis Mother   . Stroke Father   .  Hypertension Father   . Colon cancer Sister   . Hypertension Sister   . Kidney disease Neg Hx     Social History Social History  Substance Use Topics  . Smoking status: Former Smoker    Packs/day: 1.00    Types: Cigarettes    Quit date: 03/01/1995  . Smokeless tobacco: Never Used     Comment: quit 1998  . Alcohol use No    Review of Systems Constitutional: No fever/chills Eyes: No visual changes. ENT: No sore throat. Cardiovascular: Denies chest pain. Respiratory: Denies shortness of breath. Gastrointestinal: No abdominal pain.  No nausea, no vomiting.  No diarrhea.  No constipation. Genitourinary: Negative for dysuria. Musculoskeletal: Negative for back pain. Skin: Negative for rash. Neurological: Negative for headaches, focal weakness or numbness.    ____________________________________________   PHYSICAL EXAM:  VITAL SIGNS: ED Triage Vitals  Enc Vitals Group     BP 08/28/16 1232 (!) 80/50     Pulse Rate 08/28/16 1232 75     Resp 08/28/16 1232 18     Temp 08/28/16 1232 98 F (36.7 C)     Temp Source 08/28/16 1232 Oral     SpO2 08/28/16 1232 99 %     Weight 08/28/16 1232 180 lb (81.6 kg)     Height 08/28/16 1232 5\' 7"  (1.702 m)     Head Circumference --      Peak Flow --      Pain Score 08/28/16 1230 0     Pain Loc --      Pain Edu?  --      Excl. in GC? --     Constitutional: Alert and oriented. Somnolent and globally appears tired. No distress. Eyes: Conjunctivae are normal. Head: Atraumatic. Nose: No congestion/rhinnorhea. Mouth/Throat: Mucous membranes are moist. Neck: No stridor.  No cervical tenderness. Cardiovascular: Normal rate, regular rhythm. Grossly normal heart sounds.  Good peripheral circulation. Respiratory: Normal respiratory effort.  No retractions. Lungs CTAB. Gastrointestinal: Soft and nontender. No distention. Musculoskeletal: No lower extremity tenderness nor edema except the left knee the patient reports mild tenderness to motion across the front of the knee without obvious deformity. Small abrasions over the left leg anterior shin that are dry and intact. Neurologic:  Normal speech and language. No gross focal neurologic deficits are appreciated.  Skin:  Skin is warm, dry and intact. No rash noted. Psychiatric: Mood and affect are normal. Speech and behavior are normal.  ____________________________________________   LABS (all labs ordered are listed, but only abnormal results are displayed)  Labs Reviewed  CBC - Abnormal; Notable for the following:       Result Value   RBC 3.27 (*)    Hemoglobin 10.1 (*)    HCT 29.7 (*)    RDW 15.0 (*)    All other components within normal limits  DIFFERENTIAL - Abnormal; Notable for the following:    Lymphs Abs 0.9 (*)    All other components within normal limits  COMPREHENSIVE METABOLIC PANEL - Abnormal; Notable for the following:    Sodium 132 (*)    Chloride 100 (*)    Glucose, Bld 106 (*)    Calcium 8.7 (*)    Total Protein 5.8 (*)    Albumin 2.3 (*)    All other components within normal limits  PROTIME-INR  APTT  TROPONIN I  VALPROIC ACID LEVEL  LACTIC ACID, PLASMA  LACTIC ACID, PLASMA  URINALYSIS, COMPLETE (UACMP) WITH MICROSCOPIC  CK  CORTISOL  CBG MONITORING, ED  ____________________________________________  EKG  Reviewed  and interpreted by me at 1245 Intrauterine rate 70 QRS 80 QTc 4:30 Normal sinus rhythm, nonspecific T wave laterality seen across V2 and V3, no obvious ischemic changes noted ____________________________________________  RADIOLOGY  Ct Head Wo Contrast  Result Date: 08/28/2016 CLINICAL DATA:  Increasing weakness. EXAM: CT HEAD WITHOUT CONTRAST TECHNIQUE: Contiguous axial images were obtained from the base of the skull through the vertex without intravenous contrast. COMPARISON:  MRI of the head 02/24/2014 FINDINGS: Brain: No evidence of acute infarction, hemorrhage, hydrocephalus, extra-axial collection or mass lesion/mass effect. Moderate brain parenchymal volume loss and mild periventricular microangiopathy. Vascular: No hyperdense vessel or unexpected calcification. Skull: Normal. Negative for fracture or focal lesion. Sinuses/Orbits: No acute finding. Other: None. IMPRESSION: No acute intracranial abnormality. Moderate brain parenchymal atrophy and mild chronic microvascular disease. Electronically Signed   By: Ted Mcalpineobrinka  Dimitrova M.D.   On: 08/28/2016 13:08   Dg Chest Portable 1 View  Result Date: 08/28/2016 CLINICAL DATA:  Altered mental status EXAM: PORTABLE CHEST 1 VIEW COMPARISON:  None. FINDINGS: The heart size and mediastinal contours are within normal limits. Both lungs are clear. The visualized skeletal structures are unremarkable. IMPRESSION: No active disease. Electronically Signed   By: Deatra RobinsonKevin  Herman M.D.   On: 08/28/2016 17:04    ____________________________________________   PROCEDURES  Procedure(s) performed: None  Procedures  Critical Care performed: Yes, see critical care note(s)  CRITICAL CARE Performed by: Sharyn CreamerQUALE, Jissell Trafton   Total critical care time: 37 minutes  Critical care time was exclusive of separately billable procedures and treating other patients.  Critical care was necessary to treat or prevent imminent or life-threatening deterioration.  Critical  care was time spent personally by me on the following activities: development of treatment plan with patient and/or surrogate as well as nursing, discussions with consultants, evaluation of patient's response to treatment, examination of patient, obtaining history from patient or surrogate, ordering and performing treatments and interventions, ordering and review of laboratory studies, ordering and review of radiographic studies, pulse oximetry and re-evaluation of patient's condition.  Patient symptomatic with blood pressure less than the 70 systolic, present with a systolic of 80. Patient felt to be at risk for cardiovascular collapse based on symptomatic hypotension ____________________________________________   INITIAL IMPRESSION / ASSESSMENT AND PLAN / ED COURSE  Pertinent labs & imaging results that were available during my care of the patient were reviewed by me and considered in my medical decision making (see chart for details).  Patient presents for generalized weakness.    Patient presents for multiple episodes of falling. Sound like the patient is developing symptoms primarily when standing, possibly related to hypotension or orthostasis. Etiology of this night are clear. EKG demonstrates no acute abnormality, but were BuSpar reassuring.  Head CT demonstrates no acute change. Patient awake and alert.  On testing orthostatics, patient's blood pressure drops for earlier for into the 70s systolic. She became symptomatic. Continue IV fluid resuscitation. Patient awake and alert with no complaint laying flat.    ____________________________________________   FINAL CLINICAL IMPRESSION(S) / ED DIAGNOSES  Final diagnoses:  Hypotension, unspecified hypotension type      NEW MEDICATIONS STARTED DURING THIS VISIT:  New Prescriptions   No medications on file     Note:  This document was prepared using Dragon voice recognition software and may include unintentional dictation  errors.     Sharyn CreamerQuale, Jacquis Paxton, MD 08/28/16 1806

## 2016-08-29 DIAGNOSIS — E871 Hypo-osmolality and hyponatremia: Secondary | ICD-10-CM | POA: Diagnosis present

## 2016-08-29 DIAGNOSIS — E860 Dehydration: Secondary | ICD-10-CM | POA: Diagnosis present

## 2016-08-29 DIAGNOSIS — Z87891 Personal history of nicotine dependence: Secondary | ICD-10-CM | POA: Diagnosis not present

## 2016-08-29 DIAGNOSIS — K219 Gastro-esophageal reflux disease without esophagitis: Secondary | ICD-10-CM | POA: Diagnosis present

## 2016-08-29 DIAGNOSIS — Z6827 Body mass index (BMI) 27.0-27.9, adult: Secondary | ICD-10-CM | POA: Diagnosis not present

## 2016-08-29 DIAGNOSIS — R32 Unspecified urinary incontinence: Secondary | ICD-10-CM | POA: Diagnosis present

## 2016-08-29 DIAGNOSIS — G894 Chronic pain syndrome: Secondary | ICD-10-CM | POA: Diagnosis present

## 2016-08-29 DIAGNOSIS — R296 Repeated falls: Secondary | ICD-10-CM | POA: Diagnosis present

## 2016-08-29 DIAGNOSIS — E2740 Unspecified adrenocortical insufficiency: Secondary | ICD-10-CM | POA: Diagnosis present

## 2016-08-29 DIAGNOSIS — N390 Urinary tract infection, site not specified: Secondary | ICD-10-CM | POA: Diagnosis present

## 2016-08-29 DIAGNOSIS — R627 Adult failure to thrive: Secondary | ICD-10-CM | POA: Diagnosis present

## 2016-08-29 DIAGNOSIS — Z8249 Family history of ischemic heart disease and other diseases of the circulatory system: Secondary | ICD-10-CM | POA: Diagnosis not present

## 2016-08-29 DIAGNOSIS — Z823 Family history of stroke: Secondary | ICD-10-CM | POA: Diagnosis not present

## 2016-08-29 DIAGNOSIS — I951 Orthostatic hypotension: Secondary | ICD-10-CM | POA: Diagnosis present

## 2016-08-29 DIAGNOSIS — R739 Hyperglycemia, unspecified: Secondary | ICD-10-CM | POA: Diagnosis present

## 2016-08-29 DIAGNOSIS — F329 Major depressive disorder, single episode, unspecified: Secondary | ICD-10-CM | POA: Diagnosis present

## 2016-08-29 DIAGNOSIS — Z96642 Presence of left artificial hip joint: Secondary | ICD-10-CM | POA: Diagnosis present

## 2016-08-29 DIAGNOSIS — Z800 Family history of malignant neoplasm of digestive organs: Secondary | ICD-10-CM | POA: Diagnosis not present

## 2016-08-29 DIAGNOSIS — E785 Hyperlipidemia, unspecified: Secondary | ICD-10-CM | POA: Diagnosis present

## 2016-08-29 DIAGNOSIS — R634 Abnormal weight loss: Secondary | ICD-10-CM | POA: Diagnosis present

## 2016-08-29 DIAGNOSIS — Z820 Family history of epilepsy and other diseases of the nervous system: Secondary | ICD-10-CM | POA: Diagnosis not present

## 2016-08-29 DIAGNOSIS — I10 Essential (primary) hypertension: Secondary | ICD-10-CM | POA: Diagnosis present

## 2016-08-29 DIAGNOSIS — M545 Low back pain: Secondary | ICD-10-CM | POA: Diagnosis present

## 2016-08-29 DIAGNOSIS — F419 Anxiety disorder, unspecified: Secondary | ICD-10-CM | POA: Diagnosis present

## 2016-08-29 LAB — CBC
HCT: 31.1 % — ABNORMAL LOW (ref 35.0–47.0)
HEMOGLOBIN: 10.5 g/dL — AB (ref 12.0–16.0)
MCH: 30.6 pg (ref 26.0–34.0)
MCHC: 33.7 g/dL (ref 32.0–36.0)
MCV: 90.7 fL (ref 80.0–100.0)
Platelets: 321 10*3/uL (ref 150–440)
RBC: 3.43 MIL/uL — AB (ref 3.80–5.20)
RDW: 15.1 % — ABNORMAL HIGH (ref 11.5–14.5)
WBC: 5.4 10*3/uL (ref 3.6–11.0)

## 2016-08-29 LAB — GLUCOSE, CAPILLARY
Glucose-Capillary: 64 mg/dL — ABNORMAL LOW (ref 65–99)
Glucose-Capillary: 83 mg/dL (ref 65–99)

## 2016-08-29 LAB — BASIC METABOLIC PANEL
ANION GAP: 6 (ref 5–15)
BUN: <5 mg/dL — ABNORMAL LOW (ref 6–20)
CHLORIDE: 105 mmol/L (ref 101–111)
CO2: 24 mmol/L (ref 22–32)
CREATININE: 0.62 mg/dL (ref 0.44–1.00)
Calcium: 8.3 mg/dL — ABNORMAL LOW (ref 8.9–10.3)
GFR calc non Af Amer: >60 mL/min (ref >60)
GLUCOSE: 90 mg/dL (ref 65–99)
Potassium: 3.9 mmol/L (ref 3.5–5.1)
Sodium: 135 mmol/L (ref 135–145)

## 2016-08-29 LAB — TROPONIN I
Troponin I: <0.03 ng/mL (ref <0.03)
Troponin I: <0.03 ng/mL (ref <0.03)

## 2016-08-29 MED ORDER — PHENOL 1.4 % MT LIQD
1.0000 | OROMUCOSAL | Status: DC | PRN
  Filled 2016-08-29: qty 177

## 2016-08-29 MED ORDER — BOOST / RESOURCE BREEZE PO LIQD
1.0000 | Freq: Three times a day (TID) | ORAL | Status: DC
  Administered 2016-08-29 – 2016-08-30 (×2): 1 via ORAL

## 2016-08-29 MED ORDER — HYDROCODONE-ACETAMINOPHEN 5-325 MG PO TABS
1.0000 | ORAL_TABLET | Freq: Two times a day (BID) | ORAL | Status: DC | PRN
  Administered 2016-08-29 (×2): 1 via ORAL
  Filled 2016-08-29 (×2): qty 1

## 2016-08-29 NOTE — Progress Notes (Signed)
Hypoglycemic Event  CBG: 64 at 0805 08/29/2016  Treatment: 1 cup of orange juice- per pt request   Follow-up CBG: Time: 0825 08/29/2016 CBG Result: 83  Pt did not show any signs of hypoglycemia. Will continue to monitor pt.    Kelly Foster [place]

## 2016-08-29 NOTE — Progress Notes (Signed)
Initial Nutrition Assessment  DOCUMENTATION CODES:   Not applicable  INTERVENTION:   Boost Breeze po TID, each supplement provides 250 kcal and 9 grams of protein  RD will order [employer] when diet advanced   NUTRITION DIAGNOSIS:   Inadequate oral intake related to acute illness as evidenced by other (see comment) (clear liquid diet ).  GOAL:   Patient will meet greater than or equal to 90% of their needs  MONITOR:   PO intake, Supplement acceptance, Labs, Weight trends  REASON FOR ASSESSMENT:   Malnutrition Screening Tool    ASSESSMENT:    66 y.o. female with a known history of Chronic bilateral lower back pain without sciatica, chronic pain syndrome, as arthritis of the hip, spinal stenosis, adrenal insufficiency, bipolar disorder, depression, hypertension, hyperlipidemia, hypothyroidism, hyponatremia, who presents to the hospital with complaints of multiple falls, poor oral intake, nausea and vomiting for the past 1-2 weeks, weight loss.   Met with pt in room today. Pt has a history of chronic pain which causes her appetite and oral intake to fluctuate. Pt reports that in March she had some teeth removed which caused her mouth to ulcerate and she was unable to eat anything much past liquids for about one month. Pt reports that after this she was able to eat well and had a good appetite for about 1 month but then in May her appetite declined and again and has been poor ever since. Pt reports that her appetite has worsened over the past two weeks and she reports intermittent N/V r/t dizziness when standing. Pt has had multiple recent falls. Per chart, pt appears to have lost 36lbs(17%) over the last 10 months but has only lost 5lbs(3%) over the past 6 weeks which is not significant. Pt is currently eating 75% of her clear liquid diet; today she ate a jello, drank some juice and drank about 1/2 of her broth. Pt does not like milky supplements but loves milk. RD will  order [employer] when diet advanced. Pt does not meet criteria for malnutrition at this time but RD suspects that pt is at high risk for malnutrition. Continue to encourage intake of meals and supplements.     Medications reviewed and include: aspirin, colace, lovenox, hydrocodone, solu-cortef, protonix, miralax, senokot, ceftriaxone  Labs reviewed: BUN <5(L), Ca 8.3(L)  Nutrition-Focused physical exam completed. Findings are no fat depletion, no muscle depletion, and mild edema in BLE.   Diet Order:  Diet clear liquid Room service appropriate? Yes; Fluid consistency: Thin  Skin:  Reviewed, no issues  Last BM:  7/26  Height:   Ht Readings from Last 1 Encounters:  08/28/16 5' 7"  (1.702 m)    Weight:   Wt Readings from Last 1 Encounters:  08/29/16 181 lb 11.2 oz (82.4 kg)    Ideal Body Weight:  61.4 kg  BMI:  Body mass index is 28.46 kg/m.  Estimated Nutritional Needs:   Kcal:  1700-2000kcal/day   Protein:  82-99g/day   Fluid:  >1.7L/day   EDUCATION NEEDS:   Education needs addressed  Koleen Distance MS, RD, LDN Pager #[phone] After Hours Pager: [phone]

## 2016-08-29 NOTE — Care Management Obs Status (Signed)
MEDICARE OBSERVATION STATUS NOTIFICATION   Patient Details  Name: Kelly Foster MRN: [id number] Date of Birth: 07/26/1950   Medicare Observation Status Notification Given:  Yes    Chapman FitchBOWEN, Chip Canepa T, RN 08/29/2016, 3:31 PM

## 2016-08-29 NOTE — Evaluation (Signed)
Occupational Therapy Evaluation Patient Details Name: Kelly Foster MRN: [id number] DOB: Feb 05, 1950 Today's Date: 08/29/2016    History of Present Illness Marea Mole  is a 66 y.o. female with a known history of Chronic bilateral lower back pain without sciatica, chronic pain syndrome, as arthritis of the hip, spinal stenosis, adrenal insufficiency, bipolar disorder, depression, hypertension, hyperlipidemia, hypothyroidism, hyponatremia, who presents to the hospital with complaints of multiple falls, poor oral intake, nausea and vomiting for the past 1-2 weeks, weight loss. I will continue the patient, she has been having dizziness and feeling presyncopal. Each time she stands up, she is having nausea and vomiting and very poor appetite over the past 2 weeks and approximately 15 pound weight loss over the same period of time. She denies any black or tarry looking stool, however, admits of constipation read. She admits of increased frequency of urination, and some incontinence, which has improved with Ditropan. On arrival to emergency room, she was noted to be hypotensive, systolic blood pressure in 80s, she was also orthostatically hypotensive. She was given IV fluids and hospitalist services were contacted for admission.   Clinical Impression   Patient seen for OT evaluation this date.  Patient presents with muscle weakness, orthostatic hypotension, risk for falls, decreased transfers, functional mobility and decreased ability to perform self care tasks.  Her son lives with her but he works 12 hour days from 3 pm to 3 am on Fridays, Saturdays and Sundays.  Given her current risk for falls she would require 24 hour supervision and assistance to return home.  Recommend short term rehab if her family cannot provide this level of care.  Patient would benefit from continued OT to maximize safety and independence in daily tasks.  Recommend follow up OT services at next level of care.      Follow Up  Recommendations  SNF    Equipment Recommendations       Recommendations for Other Services       Precautions / Restrictions Precautions Precautions: Fall Restrictions Weight Bearing Restrictions: No      Mobility Bed Mobility Overal bed mobility: Modified Independent                Transfers                 General transfer comment: Sit to stand with min guard    Balance Overall balance assessment: History of Falls                                         ADL either performed or assessed with clinical judgement   ADL Overall ADL's : Needs assistance/impaired Eating/Feeding: Modified independent Eating/Feeding Details (indicate cue type and reason): on a liquid diet currently Grooming: Sitting;Set up   Upper Body Bathing: Sitting;Set up   Lower Body Bathing: Minimal assistance;Sitting/lateral leans   Upper Body Dressing : Set up;Sitting   Lower Body Dressing: Moderate assistance Lower Body Dressing Details (indicate cue type and reason): Patient unable to don or doff socks, difficulty with underwear Toilet Transfer: Minimal assistance Toilet Transfer Details (indicate cue type and reason): Patient with orthostatic hypotension and complains of dizziness when up           General ADL Comments: Patient's son lives with her but her son works 3 pm to 3 am on Fridays, Saturdays and Sundays.  With patient's dizziness and orthostatic hypotension, she  is at risk for falls and will need 24 supervision and assistance.      Vision Baseline Vision/History: Wears glasses Patient Visual Report: No change from baseline       Perception     Praxis      Pertinent Vitals/Pain Pain Assessment: 0-10     Hand Dominance Right   Extremity/Trunk Assessment Upper Extremity Assessment Upper Extremity Assessment: Generalized weakness   Lower Extremity Assessment Lower Extremity Assessment: Defer to PT evaluation   Cervical / Trunk  Assessment Cervical / Trunk Assessment: Normal   Communication Communication Communication: No difficulties   Cognition Arousal/Alertness: Awake/alert Behavior During Therapy: WFL for tasks assessed/performed Overall Cognitive Status: Within Functional Limits for tasks assessed                                     General Comments  Patient with 2 falls in the last 12 months.  Dizziness noted today with sit to stand, did not perform functional mobility    Exercises     Shoulder Instructions      Home Living Family/patient expects to be discharged to:: Private residence Living Arrangements: Children (son lives with her but he works 12 hour shifts on Friday, SAt. and sun) Available Help at Discharge: Family Type of Home: House Home Access: Stairs to enter   Entrance Stairs-Rails: Right;Can reach both;Left Home Layout: One level     Bathroom Shower/Tub: Chief Strategy OfficerTub/shower unit   Bathroom Toilet: Handicapped height Bathroom Accessibility: Yes How Accessible: Accessible via walker Home Equipment: Walker - 2 wheels;Cane - single point   Additional Comments: Patient has sock aid and reacher at home.      Prior Functioning/Environment Level of Independence: Independent with assistive device(s)                 OT Problem List: Decreased strength;Impaired balance (sitting and/or standing);Decreased activity tolerance;Decreased knowledge of use of DME or AE      OT Treatment/Interventions: Self-care/ADL training;DME and/or AE instruction;Balance training;Therapeutic exercise;Patient/family education    OT Goals(Current goals can be found in the care plan section) Acute Rehab OT Goals Patient Stated Goal: Patient reports she would like to go back home and be independent. OT Goal Formulation: With patient Time For Goal Achievement: 09/13/16 Potential to Achieve Goals: Good  OT Frequency: Min 1X/week   Barriers to D/C:            Co-evaluation               AM-PAC PT "6 Clicks" Daily Activity     Outcome Measure Help from another person eating meals?: None Help from another person taking care of personal grooming?: A Little Help from another person toileting, which includes using toliet, bedpan, or urinal?: A Little Help from another person bathing (including washing, rinsing, drying)?: A Lot Help from another person to put on and taking off regular upper body clothing?: A Little Help from another person to put on and taking off regular lower body clothing?: A Lot 6 Click Score: 17   End of Session Equipment Utilized During Treatment: Gait belt  Activity Tolerance: Patient tolerated treatment well Patient left: in bed;with call bell/phone within reach;with bed alarm set  OT Visit Diagnosis: Unsteadiness on feet (R26.81);Muscle weakness (generalized) (M62.81);History of falling (Z91.81)                Time: 4540-98110846-0905 OT Time Calculation (min): 19 min Charges:  OT General Charges $OT Visit: 1 Procedure OT Evaluation $OT Eval Low Complexity: 1 Procedure G-Codes: OT G-codes **NOT FOR INPATIENT CLASS** Functional Assessment Tool Used: Clinical judgement Functional Limitation: Self care Self Care Current Status (N8295(G8987): At least 20 percent but less than 40 percent impaired, limited or restricted Self Care Goal Status (A2130(G8988): At least 1 percent but less than 20 percent impaired, limited or restricted   Amy T Lovett, OTR/L, CLT   Lovett,Amy 08/29/2016, 9:53 AM

## 2016-08-29 NOTE — Evaluation (Signed)
Physical Therapy Evaluation Patient Details Name: Mariangel Ringley MRN: [id number] DOB: 16-Oct-1950 Today's Date: 08/29/2016   History of Present Illness  Pt is a 66 y/o F who presented following multiple falls, poor oral intake, nausea, and vomiting.  Pt has been experiencing dizziness and feeling presyncopal.  In the ED the pt was noted to be hypotensive and orthostatically hypotensive.  Pt's PMH includes Bipolar disorder, chronic cervical radiculopathy, chronic LBP without sciatica, chronic pain syndrome, hip pain, lumbar radiculitis, manic depressive disorder, SIJ dysfunction, trochanteric bursitis of L hip, L THA.    Clinical Impression  Pt admitted with above diagnosis. Pt currently with functional limitations due to the deficits listed below (see PT Problem List). Ms. Haslip currently require min guard assist for safe transfers due to +orthostatics (although pt denies any symptoms of dizziness/lightheadedness throughout session).  Min guard>min assist was provided to manage RW when ambulating in hallway as RW was pulling pt to the L but pt had difficulty correcting RW.  Pt will have 24/7 assist/supervisoin from her son and sister at d/c.  Pt will benefit from skilled PT to increase their independence and safety with mobility to allow discharge to the venue listed below.      Follow Up Recommendations Outpatient PT    Equipment Recommendations  None recommended by PT    Recommendations for Other Services       Precautions / Restrictions Precautions Precautions: Fall;Other (comment) Precaution Comments: +orthostatics during current admission Restrictions Weight Bearing Restrictions: No      Mobility  Bed Mobility Overal bed mobility: Modified Independent             General bed mobility comments: Pt requires increased time and effort with use of bed rail but no physical assist or cues needed  Transfers Overall transfer level: Needs assistance Equipment used: Rolling  walker (2 wheeled) Transfers: Sit to/from [employer] Sit to Stand: Min guard Stand pivot transfers: Min guard       General transfer comment: Close min guard assist due to +orthostatics during this admission and during session today.  To pivot to [facility] the pt reaches for armrests of commode when turning.    Ambulation/Gait Ambulation/Gait assistance: Min guard;Min assist Ambulation Distance (Feet): 200 Feet Assistive device: Rolling walker (2 wheeled) Gait Pattern/deviations: Step-through pattern;Decreased stride length;Trunk flexed   Gait velocity interpretation: at or above normal speed for age/gender General Gait Details: Pt ambulates with a wide BOS and has tendency to push RW too far ahead.  Pt has to be stopped when ambulating several times as RW is off to her L side and her feet are outside of the base of support of the RW.  (This PT did check this after the session and the walker did pull mildly to the L).  Pt shows minimal effort to correct alignment of RW when it begins going to the L and requires cues and up to min assist to manage RW.  Pt denies dizziness throughout entire session.  Stairs            Wheelchair Mobility    Modified Rankin (Stroke Patients Only)       Balance Overall balance assessment: Needs assistance;History of Falls Sitting-balance support: No upper extremity supported;Feet supported Sitting balance-Leahy Scale: Good     Standing balance support: No upper extremity supported;During functional activity Standing balance-Leahy Scale: Fair Standing balance comment: Pt able to stand statcially but requires UE support for dynamic activities  Pertinent Vitals/Pain Pain Assessment: Faces Faces Pain Scale: Hurts whole lot Pain Location: throat Pain Descriptors / Indicators: Grimacing Pain Intervention(s): Limited activity within patient's tolerance;Monitored during session;Repositioned     Home Living Family/patient expects to be discharged to:: Private residence Living Arrangements: Children (son) Available Help at Discharge: Family;Available 24 hours/day (Son works [employer]Fr,Sat,Sun 3pm-3am each day (12 hr shifts)) Type of Home: House Home Access: Stairs to enter Entrance Stairs-Rails: Right;Can reach Technical sales engineerboth;Left Entrance Stairs-Number of Steps: 2 Home Layout: One level Home Equipment: Environmental consultantWalker - 2 wheels;Walker - 4 wheels;Shower seat;Cane - quad;Bedside commode Additional Comments: Sister is planning to stay with pt at d/c    Prior Function Level of Independence: Independent         Comments: Pt ambulates without AD.  Pt reports 4 falls in the past few days.  Independent with all ALDs, IADLs.  Pt still driving.     Hand Dominance   Dominant Hand: Right    Extremity/Trunk Assessment   Upper Extremity Assessment Upper Extremity Assessment: Defer to OT evaluation    Lower Extremity Assessment Lower Extremity Assessment:  (BLE strength grossly 4/5)       Communication   Communication: No difficulties  Cognition Arousal/Alertness: Awake/alert Behavior During Therapy: WFL for tasks assessed/performed Overall Cognitive Status: Within Functional Limits for tasks assessed                                        General Comments General comments (skin integrity, edema, etc.): BP supine 125/64, in sitting 100/56, in standing 92/50.  Pt denies dizziness or lightheadedness throughout entire session.    Exercises     Assessment/Plan    PT Assessment Patient needs continued PT services  PT Problem List Decreased strength;Decreased balance;Decreased knowledge of use of DME;Decreased safety awareness;Pain       PT Treatment Interventions DME instruction;Gait training;Functional mobility training;Therapeutic activities;Stair training;Therapeutic exercise;Balance training;Neuromuscular re-education;Patient/family education    PT Goals (Current goals  can be found in the Care Plan section)  Acute Rehab PT Goals Patient Stated Goal: to go home PT Goal Formulation: With patient Time For Goal Achievement: 09/12/16 Potential to Achieve Goals: Good    Frequency Min 2X/week   Barriers to discharge        Co-evaluation               AM-PAC PT "6 Clicks" Daily Activity  Outcome Measure Difficulty turning over in bed (including adjusting bedclothes, sheets and blankets)?: None Difficulty moving from lying on back to sitting on the side of the bed? : A Little Difficulty sitting down on and standing up from a chair with arms (e.g., wheelchair, bedside commode, etc,.)?: A Little Help needed moving to and from a bed to chair (including a wheelchair)?: A Little Help needed walking in hospital room?: A Little Help needed climbing 3-5 steps with a railing? : A Little 6 Click Score: 19    End of Session Equipment Utilized During Treatment: Gait belt Activity Tolerance: Patient tolerated treatment well Patient left: in chair;with call bell/phone within reach;with chair alarm set Nurse Communication: Mobility status;Other (comment) (BP readings) PT Visit Diagnosis: Unsteadiness on feet (R26.81);Muscle weakness (generalized) (M62.81)    Time: [id number] PT Time Calculation (min) (ACUTE ONLY): 31 min   Charges:   PT Evaluation $PT Eval Low Complexity: 1 Procedure PT Treatments $Gait Training: 8-22 mins   PT G Codes:   PT G-Codes **  NOT FOR INPATIENT CLASS** Functional Assessment Tool Used: AM-PAC 6 Clicks Basic Mobility;Clinical judgement Functional Limitation: Mobility: Walking and moving around Mobility: Walking and Moving Around Current Status (Z6109(G8978): At least 20 percent but less than 40 percent impaired, limited or restricted Mobility: Walking and Moving Around Goal Status [phone](G8979): At least 1 percent but less than 20 percent impaired, limited or restricted    Encarnacion ChuAshley Deena Shaub PT, DPT 08/29/2016, 4:22 PM

## 2016-08-29 NOTE — Progress Notes (Signed)
[employer] [facility] [place] [facility]   PATIENT NAME: Kelly Foster    MR#:  [id number]  DATE OF BIRTH:  07/06/1950  SUBJECTIVE:  CHIEF COMPLAINT:   Chief Complaint  Patient presents with  . Altered Mental Status  . Fall    Cane with feeling weak and dizzy, noted to have significant orthostatic hypotension and UTI.  REVIEW OF SYSTEMS:  CONSTITUTIONAL: No fever,Positive for fatigue or weakness.  EYES: No blurred or double vision.  EARS, NOSE, AND THROAT: No tinnitus or ear pain.  RESPIRATORY: No cough, shortness of breath, wheezing or hemoptysis.  CARDIOVASCULAR: No chest pain, orthopnea, edema.  GASTROINTESTINAL: No nausea, vomiting, diarrhea or abdominal pain.  GENITOURINARY: No dysuria, hematuria.  ENDOCRINE: No polyuria, nocturia,  HEMATOLOGY: No anemia, easy bruising or bleeding SKIN: No rash or lesion. MUSCULOSKELETAL: No joint pain or arthritis.   NEUROLOGIC: No tingling, numbness, weakness.  PSYCHIATRY: No anxiety or depression.   ROS  DRUG ALLERGIES:   Allergies  Allergen Reactions  . Gabapentin Other (See Comments)    tremors tremors  . Codeine Itching  . Pregabalin Other (See Comments)    Left side pain under arm, started after use of lyrica  . Tramadol Itching    VITALS:  Blood pressure (!) 118/98, pulse (!) 123, temperature 98.4 F (36.9 C), temperature source Oral, resp. rate 14, height 5\' 7"  (1.702 m), weight 82.4 kg (181 lb 11.2 oz), SpO2 98 %.  PHYSICAL EXAMINATION:  GENERAL:  66 y.o.-year-old patient lying in the bed with no acute distress.  EYES: Pupils equal, round, reactive to light and accommodation. No scleral icterus. Extraocular muscles intact.  HEENT: Head atraumatic, normocephalic. Oropharynx and nasopharynx clear. No teeth. NECK:  Supple, no jugular venous distention. No thyroid enlargement, no tenderness.  LUNGS: Normal breath sounds bilaterally, no wheezing, rales,rhonchi or crepitation. No use of accessory muscles of  respiration.  CARDIOVASCULAR: S1, S2 normal. No murmurs, rubs, or gallops.  ABDOMEN: Soft, nontender, nondistended. Bowel sounds present. No organomegaly or mass.  EXTREMITIES: No pedal edema, cyanosis, or clubbing.  NEUROLOGIC: Cranial nerves II through XII are intact. Muscle strength 4/5 in all extremities. Sensation intact. Gait not checked.  PSYCHIATRIC: The patient is alert and oriented x 3.  SKIN: No obvious rash, lesion, or ulcer.   Physical Exam LABORATORY PANEL:   CBC  Recent Labs Lab 08/29/16 0826  WBC 5.4  HGB 10.5*  HCT 31.1*  PLT 321   ------------------------------------------------------------------------------------------------------------------  Chemistries   Recent Labs Lab 08/28/16 1238 08/29/16 0826  NA 132* 135  K 4.5 3.9  CL 100* 105  CO2 26 24  GLUCOSE 106* 90  BUN 7 <5*  CREATININE 0.68 0.62  CALCIUM 8.7* 8.3*  AST 29  --   ALT 16  --   ALKPHOS 103  --   BILITOT 0.4  --    ------------------------------------------------------------------------------------------------------------------  Cardiac Enzymes  Recent Labs Lab 08/29/16 0217 08/29/16 0826  TROPONINI <0.03 <0.03   ------------------------------------------------------------------------------------------------------------------  RADIOLOGY:  Dg Abd 1 View  Result Date: 08/28/2016 CLINICAL DATA:  Nausea, vomiting, and weakness. Difficult bowel movements for 2 weeks. Constipation. EXAM: ABDOMEN - 1 VIEW COMPARISON:  Abdomen and CT abdomen and pelvis 08/13/2016 FINDINGS: Scattered gas and stool throughout the colon and in small bowel. No small or large bowel distention. Changes may reflect mild ileus. No radiopaque stones. Degenerative changes in the lumbar spine and right hip. Postoperative left hip arthroplasty. Calcified phleboliths in the pelvis. IMPRESSION: Nonobstructive bowel gas pattern with scattered  gas-filled nondilated small and large bowel likely representing mild ileus.  Electronically Signed   By: Burman NievesWilliam  Stevens M.D.   On: 08/28/2016 20:08   Ct Head Wo Contrast  Result Date: 08/28/2016 CLINICAL DATA:  Increasing weakness. EXAM: CT HEAD WITHOUT CONTRAST TECHNIQUE: Contiguous axial images were obtained from the base of the skull through the vertex without intravenous contrast. COMPARISON:  MRI of the head 02/24/2014 FINDINGS: Brain: No evidence of acute infarction, hemorrhage, hydrocephalus, extra-axial collection or mass lesion/mass effect. Moderate brain parenchymal volume loss and mild periventricular microangiopathy. Vascular: No hyperdense vessel or unexpected calcification. Skull: Normal. Negative for fracture or focal lesion. Sinuses/Orbits: No acute finding. Other: None. IMPRESSION: No acute intracranial abnormality. Moderate brain parenchymal atrophy and mild chronic microvascular disease. Electronically Signed   By: Ted Mcalpineobrinka  Dimitrova M.D.   On: 08/28/2016 13:08   Dg Chest Portable 1 View  Result Date: 08/28/2016 CLINICAL DATA:  Altered mental status EXAM: PORTABLE CHEST 1 VIEW COMPARISON:  None. FINDINGS: The heart size and mediastinal contours are within normal limits. Both lungs are clear. The visualized skeletal structures are unremarkable. IMPRESSION: No active disease. Electronically Signed   By: Deatra RobinsonKevin  Herman M.D.   On: 08/28/2016 17:04   Dg Knee Complete 4 Views Left  Result Date: 08/28/2016 CLINICAL DATA:  Diffuse left knee pain.  Multiple falls. EXAM: LEFT KNEE - COMPLETE 4+ VIEW COMPARISON:  None. FINDINGS: No evidence of fracture, or dislocation. Small suprapatellar joint effusion. Three compartment osteoarthritic changes with joint space narrowing, sub chondral sclerosis and mild osteophytosis. Normal soft tissues. IMPRESSION: Small suprapatellar joint effusion. Three compartment osteoarthritic changes, mild-to-moderate. Electronically Signed   By: Ted Mcalpineobrinka  Dimitrova M.D.   On: 08/28/2016 18:16    ASSESSMENT AND PLAN:   Active Problems:    Failure to thrive in adult   Orthostatic hypotension   Hyponatremia   Hyperglycemia   Pyuria  #1. Falls, likely due to orthostatic hypotension, get physical, occupational therapist consultation after rehydration.  #2. Orthostatic hypotension Due to UTI, likely due to intermittent nausea and vomiting and poor appetite/dehydration, initiate IV fluids, follow orthostatic blood pressure readings every shift #3. Hyponatremia, likely due to dehydration, follow sodium level in the morning, normal cortisol level and TSH #4. Hyperglycemia, get hemoglobin A1c to rule out diabetes and the reason for orthostatic hypotension/dehydration #5. Pyuria, concerning for rheumatoid infection, getting urinary cultures, initiate patient on Rocephin #6. Failure to thrive adult, nausea, vomiting, initiate patient on Protonix, initiate clear liquid diet, advance diet as tolerated, no obstruction on KUB.   All the records are reviewed and case discussed with Care Management/Social Workerr. Management plans discussed with the patient, family and they are in agreement.  CODE STATUS: full.  TOTAL TIME TAKING CARE OF THIS PATIENT: 35 minutes.     POSSIBLE D/C IN 1-2 DAYS, DEPENDING ON CLINICAL CONDITION.   Altamese DillingVACHHANI, Jordy Verba M.D on 08/29/2016   Between 7am to 6pm - Pager - [phone]  After 6pm go to [email or web address] - password [email or web address] [facility]  [facility]  Office  [phone]  CC: Primary care physician; Galen ManilaKennedy, Lauren Renee, NP  Note: This dictation was prepared with Dragon dictation along with smaller phrase technology. Any transcriptional errors that result from this process are unintentional.

## 2016-08-29 NOTE — Care Management (Signed)
Patient admitted from home with falls.  Lives at home with son.  PCP Kyung RuddKennedy.  Pharmacy [facility].  [facility].  Notified Christie with [facility] of admission.  Patient states that she has rollator, cane, and shower seat in the home.  PT consult pending.  RNCM following.

## 2016-08-30 LAB — HEMOGLOBIN A1C
HEMOGLOBIN A1C: 5.6 % (ref 4.8–5.6)
MEAN PLASMA GLUCOSE: 114 mg/dL

## 2016-08-30 LAB — URINE CULTURE

## 2016-08-30 LAB — GLUCOSE, CAPILLARY: Glucose-Capillary: 86 mg/dL (ref 65–99)

## 2016-08-30 MED ORDER — CHLORHEXIDINE GLUCONATE 0.12 % MT SOLN
15.0000 mL | Freq: Two times a day (BID) | OROMUCOSAL | Status: DC
  Administered 2016-08-30: 15 mL via OROMUCOSAL
  Filled 2016-08-30: qty 15

## 2016-08-30 MED ORDER — ORAL CARE MOUTH RINSE: 15.0000 mL | Freq: Two times a day (BID) | OROMUCOSAL | Status: DC

## 2016-08-30 MED ORDER — SODIUM CHLORIDE 0.9 % IV BOLUS (SEPSIS)
1000.0000 mL | Freq: Once | INTRAVENOUS | Status: AC
  Administered 2016-08-30: 1000 mL via INTRAVENOUS

## 2016-08-30 MED ORDER — CEFUROXIME AXETIL 250 MG PO TABS: 250.0000 mg | ORAL_TABLET | Freq: Two times a day (BID) | ORAL | 0 refills | Status: AC

## 2016-08-30 NOTE — Care Management Note (Signed)
Case Management Note  Patient Details  Name: Adalberto Coleeggy Jean Swiger MRN: [id number] Date of Birth: 12/22/1950  Subjective/Objective:       [facility] [place] [facility] to resume HH-RN services. [facility] will allso evaluate Ms Laureen OchsCurrie next week for need for HH-PT. Otherwise, Ms Laureen OchsCurrie will follow-up with her PCP to arrange OP-PT.            Action/Plan:   Expected Discharge Date:  08/30/16               Expected Discharge Plan:   08/30/16  In-House Referral:     Discharge planning Services     Post Acute Care Choice:   Yes Choice offered to:   [facility]  DME Arranged:    DME Agency:     HH Arranged:   Resume RN HH Agency:   [facility]  Status of Service:   Completed.  If discussed at Long Length of Stay Meetings, dates discussed:    Additional Comments:  Ermie Glendenning A, RN 08/30/2016, 4:53 PM

## 2016-08-30 NOTE — Progress Notes (Signed)
[facility] - [place] at [facility]   PATIENT NAME: Kelly Foster    MR#:  [id number]  DATE OF BIRTH:  12/13/1950  SUBJECTIVE:   Patient here due to a fall secondary to orthostatic hypotension also noted to have a urinary tract infection. Seen by physical therapy yesterday and he recommended outpatient services. Remained orthostatic earlier today and given IV fluid bolus.  REVIEW OF SYSTEMS:    Review of Systems  Constitutional: Negative for chills and fever.  HENT: Negative for congestion and tinnitus.   Eyes: Negative for blurred vision and double vision.  Respiratory: Negative for cough, shortness of breath and wheezing.   Cardiovascular: Negative for chest pain, orthopnea and PND.  Gastrointestinal: Negative for abdominal pain, diarrhea, nausea and vomiting.  Genitourinary: Negative for dysuria and hematuria.  Neurological: Negative for dizziness, sensory change and focal weakness.  All other systems reviewed and are negative.   Nutrition: Regular Tolerating Diet: yes Tolerating PT: Eval noted.   DRUG ALLERGIES:   Allergies  Allergen Reactions  . Gabapentin Other (See Comments)    tremors tremors  . Codeine Itching  . Pregabalin Other (See Comments)    Left side pain under arm, started after use of lyrica  . Tramadol Itching    VITALS:  Blood pressure (!) 93/31, pulse (!) 104, temperature 98.6 F (37 C), temperature source Oral, resp. rate 16, height 5\' 7"  (1.702 m), weight 81.9 kg (180 lb 9.6 oz), SpO2 98 %.  PHYSICAL EXAMINATION:   Physical Exam  GENERAL:  66 y.o.-year-old patient lying in bed in no acute distress.  EYES: Pupils equal, round, reactive to light and accommodation. No scleral icterus. Extraocular muscles intact.  HEENT: Head atraumatic, normocephalic. Oropharynx and nasopharynx clear.  NECK:  Supple, no jugular venous distention. No thyroid enlargement, no tenderness.  LUNGS: Normal breath sounds bilaterally, no wheezing, rales,  rhonchi. No use of accessory muscles of respiration.  CARDIOVASCULAR: S1, S2 normal. No murmurs, rubs, or gallops.  ABDOMEN: Soft, nontender, nondistended. Bowel sounds present. No organomegaly or mass.  EXTREMITIES: No cyanosis, clubbing or edema b/l.    NEUROLOGIC: Cranial nerves II through XII are intact. No focal Motor or sensory deficits b/l. Globally weak  PSYCHIATRIC: The patient is alert and oriented x 3. SKIN: No obvious rash, lesion, or ulcer.    LABORATORY PANEL:   CBC  Recent Labs Lab 08/29/16 0826  WBC 5.4  HGB 10.5*  HCT 31.1*  PLT 321   ------------------------------------------------------------------------------------------------------------------  Chemistries   Recent Labs Lab 08/28/16 1238 08/29/16 0826  NA 132* 135  K 4.5 3.9  CL 100* 105  CO2 26 24  GLUCOSE 106* 90  BUN 7 <5*  CREATININE 0.68 0.62  CALCIUM 8.7* 8.3*  AST 29  --   ALT 16  --   ALKPHOS 103  --   BILITOT 0.4  --    ------------------------------------------------------------------------------------------------------------------  Cardiac Enzymes  Recent Labs Lab 08/29/16 0826  TROPONINI <0.03   ------------------------------------------------------------------------------------------------------------------  RADIOLOGY:  Dg Abd 1 View  Result Date: 08/28/2016 CLINICAL DATA:  Nausea, vomiting, and weakness. Difficult bowel movements for 2 weeks. Constipation. EXAM: ABDOMEN - 1 VIEW COMPARISON:  Abdomen and CT abdomen and pelvis 08/13/2016 FINDINGS: Scattered gas and stool throughout the colon and in small bowel. No small or large bowel distention. Changes may reflect mild ileus. No radiopaque stones. Degenerative changes in the lumbar spine and right hip. Postoperative left hip arthroplasty. Calcified phleboliths in the pelvis. IMPRESSION: Nonobstructive bowel gas pattern with scattered  gas-filled nondilated small and large bowel likely representing mild ileus. Electronically  Signed   By: Burman NievesWilliam  Stevens M.D.   On: 08/28/2016 20:08   Dg Chest Portable 1 View  Result Date: 08/28/2016 CLINICAL DATA:  Altered mental status EXAM: PORTABLE CHEST 1 VIEW COMPARISON:  None. FINDINGS: The heart size and mediastinal contours are within normal limits. Both lungs are clear. The visualized skeletal structures are unremarkable. IMPRESSION: No active disease. Electronically Signed   By: Deatra RobinsonKevin  Herman M.D.   On: 08/28/2016 17:04   Dg Knee Complete 4 Views Left  Result Date: 08/28/2016 CLINICAL DATA:  Diffuse left knee pain.  Multiple falls. EXAM: LEFT KNEE - COMPLETE 4+ VIEW COMPARISON:  None. FINDINGS: No evidence of fracture, or dislocation. Small suprapatellar joint effusion. Three compartment osteoarthritic changes with joint space narrowing, sub chondral sclerosis and mild osteophytosis. Normal soft tissues. IMPRESSION: Small suprapatellar joint effusion. Three compartment osteoarthritic changes, mild-to-moderate. Electronically Signed   By: Ted Mcalpineobrinka  Dimitrova M.D.   On: 08/28/2016 18:16     ASSESSMENT AND PLAN:   66 year old female with past medical history of hypertension and hyperlipidemia, history of hyponatremia, osteoarthritis, anxiety, chronic back pain, depression who presented to the hospital after a fall and noted to be orthostatic.  1. Orthostatic hypotension-secondary to dehydration and also underlying urinary tract infection. -Remained orthostatic this morning and given IV fluid bolus and will follow hemodynamics. Continue IV antibiotics with UTI.  2. Status post fall-secondary to the orthostasis and deconditioning. -Remains orthostatic but now asymptomatic. Seen by physical therapy and recommend outpatient PT.  3. Hyponatremia-secondary to dehydration, improved with IV fluid hydration.  4. Urinary tract infection-continue IV ceftriaxone, urine cultures are negative so far. Next-can switch to oral Ceftin upon discharge.  5. Depression-continue Zoloft  6.  Hyperlipidemia-continue simvastatin.  7. GERD-Protonix.  8. History of urinary incontinence-continue oxybutynin.  9. History of dementia/depression-continue Elavil, Depakote.  All the records are reviewed and case discussed with Care Management/Social Worker. Management plans discussed with the patient, family and they are in agreement.  CODE STATUS: Full  DVT Prophylaxis: Lovenox  TOTAL TIME TAKING CARE OF THIS PATIENT: 30 minutes.   POSSIBLE D/C IN 1-2 DAYS, DEPENDING ON CLINICAL CONDITION.   Houston SirenSAINANI,VIVEK J M.D on 08/30/2016 at 1:34 PM  Between 7am to 6pm - Pager - [phone]  After 6pm go to [email or web address] - Social research officer, governmentpassword EPAS [facility]  [facility]  Hospitalists  Office  [phone]  CC: Primary care physician; Galen ManilaKennedy, Lauren Renee, NP

## 2016-08-30 NOTE — Progress Notes (Signed)
Discharge this shift; voices understanding ; discharge via w/ c by staff with family by side; denies pain at this. Will continue to monitor

## 2016-08-31 NOTE — Discharge Summary (Signed)
[facility] - [place] at [facility]   PATIENT NAME: Kelly Foster    MR#:  [id number]  DATE OF BIRTH:  06/22/1950  DATE OF ADMISSION:  08/28/2016 ADMITTING PHYSICIAN: Katharina Caperima Vaickute, MD  DATE OF DISCHARGE: 08/30/2016  5:02 PM  PRIMARY CARE PHYSICIAN: Galen ManilaKennedy, Lauren Renee, NP    ADMISSION DIAGNOSIS:  Constipation [K59.00] Nausea and vomiting [R11.2] Hypotension, unspecified hypotension type [I95.9]  DISCHARGE DIAGNOSIS:  Active Problems:   Failure to thrive in adult   Orthostatic hypotension   Hyponatremia   Hyperglycemia   Pyuria   SECONDARY DIAGNOSIS:   Past Medical History:  Diagnosis Date  . Adrenal insufficiency (HCC)   . Allergy   . Anxiety   . Arthritis   . Bipolar 1 disorder (HCC)   . Candidiasis of mouth and esophagus (HCC) 08/13/2016  . Central spinal stenosis   . Cervical radiculopathy, chronic   . Chronic adrenal insufficiency (HCC) 06/06/2013  . Chronic low back pain without sciatica   . Chronic pain syndrome   . Depression   . Disorder of left sacroiliac joint   . DJD (degenerative joint disease)   . Elevated CK   . Hemorrhoids 07/11/2016  . Hip pain   . History of kidney stones   . Hyperlipidemia   . Hypertension   . Hyponatremia   . Hypothyroidism   . Lower urinary tract infection 06/06/2013   Overview:  IMO Problem List Replacer Jan. 2016   . Lumbar radiculitis   . Manic depressive disorder (HCC)   . Neuritis or radiculitis due to rupture of lumbar intervertebral disc 05/29/2014  . OA (osteoarthritis) of hip   . Overweight   . Prediabetes   . Reflux esophagitis   . Sacrococcygeal disorders, not elsewhere classified   . Sacroiliac joint dysfunction 04/05/2015  . Trochanteric bursitis of left hip   . Ulnar neuropathy   . Urinary frequency   . Vaginal atrophy     HOSPITAL COURSE:   66 year old female with past medical history of hypertension and hyperlipidemia, history of hyponatremia, osteoarthritis, anxiety, chronic back pain,  depression who presented to the hospital after a fall and noted to be orthostatic.  1. Orthostatic hypotension-secondary to dehydration and also underlying urinary tract infection. -Patient was aggressively hydrated with IV fluids and orthostatics have not improved. Patient was treated with IV antibiotics for her UTI and now being discharged on oral antibiotics. Clinically asymptomatic now.  2. Status post fall-secondary to the orthostasis and deconditioning. -Patient's orthostasis has now resolved. She was seen by physical therapy and recommended outpatient physical therapy services. She is now being discharged home.  3. Hyponatremia-secondary to dehydration, improved and resolved with IV fluid hydration.  4. Urinary tract infection-while in the hospital patient was treated with IV ceftriaxone, now being discharged on oral Ceftin.  5. Depression- she will continue Zoloft  6. Hyperlipidemia- she will continue simvastatin.  7. GERD- she will cont. Protonix.  8. History of urinary incontinence- she will continue oxybutynin.  9. History of dementia/depression-continue Elavil, Depakote.  DISCHARGE CONDITIONS:   Stable.   CONSULTS OBTAINED:    DRUG ALLERGIES:   Allergies  Allergen Reactions  . Gabapentin Other (See Comments)    tremors tremors  . Codeine Itching  . Pregabalin Other (See Comments)    Left side pain under arm, started after use of lyrica  . Tramadol Itching    DISCHARGE MEDICATIONS:   Allergies as of 08/30/2016      Reactions   Gabapentin Other (See Comments)  tremors tremors   Codeine Itching   Pregabalin Other (See Comments)   Left side pain under arm, started after use of lyrica   Tramadol Itching      Medication List    TAKE these medications   acyclovir 400 MG tablet Commonly known as:  ZOVIRAX Take 400 mg by mouth 3 (three) times daily as needed (for flares).   albuterol 108 (90 Base) MCG/ACT inhaler Commonly known as:  PROVENTIL  HFA;VENTOLIN HFA Inhale 2 puffs into the lungs every 6 (six) hours as needed for wheezing or shortness of breath.   amitriptyline 50 MG tablet Commonly known as:  ELAVIL Take 50 mg by mouth at bedtime.   ARTIFICIAL TEARS 0.1-0.3 % Soln Generic drug:  Dextran 70-Hypromellose Administer 1 drop to both eyes 4 (four) times a day as needed.   aspirin 81 MG tablet Take 1 tablet (81 mg total) by mouth daily.   CALCIUM 600-D 600-400 MG-UNIT Tabs Generic drug:  Calcium Carbonate-Vitamin D3 Take 1 tablet by mouth daily.   cefUROXime 250 MG tablet Commonly known as:  CEFTIN Take 1 tablet (250 mg total) by mouth 2 (two) times daily with a meal.   divalproex 500 MG DR tablet Commonly known as:  DEPAKOTE Take 500 mg by mouth 2 (two) times daily.   DULCOLAX 5 MG EC tablet Generic drug:  bisacodyl Take 5 mg by mouth at bedtime as needed for moderate constipation. Patient is taking 2 tabs.   HYDROcodone-acetaminophen 5-325 MG tablet Commonly known as:  NORCO/VICODIN Take 1 tablet by mouth 2 (two) times daily.   montelukast 10 MG tablet Commonly known as:  SINGULAIR Take 1 tablet (10 mg total) by mouth at bedtime. Reported on 04/05/2015   multivitamin tablet Take 1 tablet by mouth daily.   Olopatadine HCl 0.2 % Soln Place 1 drop into both eyes daily.   ondansetron 4 MG tablet Commonly known as:  ZOFRAN Take 1 tablet by mouth every 8 (eight) hours as needed.   oxybutynin 15 MG 24 hr tablet Commonly known as:  DITROPAN XL Take one in the am and one in the pm   polyethylene glycol powder powder Commonly known as:  GLYCOLAX/MIRALAX 1 cap full in a full glass of water, two times a day for 3 days if you have not had a bowel movement in 3 days.   PROCTOSOL HC 2.5 % rectal cream Generic drug:  hydrocortisone   RA LAXATIVE & STOOL SOFTENER 8.6-50 MG tablet Generic drug:  senna-docusate Take 2 tablets by mouth 2 (two) times daily.   sertraline 100 MG tablet Commonly known as:   ZOLOFT Take 200 mg by mouth daily.   simvastatin 40 MG tablet Commonly known as:  ZOCOR Take 40 mg by mouth at bedtime.   Spacer/[employer] by Does not apply route.         DISCHARGE INSTRUCTIONS:   DIET:  Regular diet  DISCHARGE CONDITION:  Stable  ACTIVITY:  Activity as tolerated  OXYGEN:  Home Oxygen: No.   Oxygen Delivery: room air  DISCHARGE LOCATION:  home   If you experience worsening of your admission symptoms, develop shortness of breath, life threatening emergency, suicidal or homicidal thoughts you must seek medical attention immediately by calling 911 or calling your MD immediately  if symptoms less severe.  You Must read complete instructions/literature along with all the possible adverse reactions/side effects for all the Medicines you take and that have been prescribed to you. Take any new Medicines  after you have completely understood and accpet all the possible adverse reactions/side effects.   Please note  You were cared for by a hospitalist during your hospital stay. If you have any questions about your discharge medications or the care you received while you were in the hospital after you are discharged, you can call the unit and asked to speak with the hospitalist on call if the hospitalist that took care of you is not available. Once you are discharged, your primary care physician will handle any further medical issues. Please note that NO REFILLS for any discharge medications will be authorized once you are discharged, as it is imperative that you return to your primary care physician (or establish a relationship with a primary care physician if you do not have one) for your aftercare needs so that they can reassess your need for medications and monitor your lab values.    DATA REVIEW:   CBC  Recent Labs Lab 08/29/16 0826  WBC 5.4  HGB 10.5*  HCT 31.1*  PLT 321    Chemistries   Recent Labs Lab 08/28/16 1238  08/29/16 0826  NA 132* 135  K 4.5 3.9  CL 100* 105  CO2 26 24  GLUCOSE 106* 90  BUN 7 <5*  CREATININE 0.68 0.62  CALCIUM 8.7* 8.3*  AST 29  --   ALT 16  --   ALKPHOS 103  --   BILITOT 0.4  --     Cardiac Enzymes  Recent Labs Lab 08/29/16 0826  TROPONINI <0.03    Microbiology Results  Results for orders placed or performed during the hospital encounter of 08/28/16  Urine culture     Status: Abnormal   Collection Time: 08/28/16  5:00 PM  Result Value Ref Range Status   Specimen Description URINE, RANDOM  Final   Special Requests NONE  Final   Culture MULTIPLE SPECIES PRESENT, SUGGEST RECOLLECTION (A)  Final   Report Status 08/30/2016 FINAL  Final    RADIOLOGY:  No results found.    Management plans discussed with the patient, family and they are in agreement   TOTAL TIME TAKING CARE OF THIS PATIENT: 40 minutes.    [place] SirenSAINANI,VIVEK J M.D on 08/31/2016 at 1:57 PM  Between 7am to 6pm - Pager - [phone]  After 6pm go to [email or web address] - Social research officer, governmentpassword EPAS [facility]  [facility] [place] [facility]  Office  [phone]  CC: Primary care physician; Galen ManilaKennedy, Lauren Renee, NP

## 2016-09-01 ENCOUNTER — Telehealth: Payer: Self-pay

## 2016-09-01 NOTE — Telephone Encounter (Signed)
I have made the 1st attempt to contact the patient or family member in charge, in order to follow up from recently being discharged from the hospital. I left a message on voicemail but I will make another attempt at a different time.  

## 2016-09-02 ENCOUNTER — Telehealth: Payer: Self-pay

## 2016-09-02 DIAGNOSIS — R102 Pelvic and perineal pain: Secondary | ICD-10-CM | POA: Diagnosis not present

## 2016-09-02 DIAGNOSIS — M545 Low back pain: Secondary | ICD-10-CM | POA: Diagnosis not present

## 2016-09-02 DIAGNOSIS — G8929 Other chronic pain: Secondary | ICD-10-CM | POA: Diagnosis not present

## 2016-09-02 DIAGNOSIS — M5136 Other intervertebral disc degeneration, lumbar region: Secondary | ICD-10-CM | POA: Diagnosis not present

## 2016-09-02 DIAGNOSIS — G894 Chronic pain syndrome: Secondary | ICD-10-CM | POA: Diagnosis not present

## 2016-09-02 DIAGNOSIS — M25559 Pain in unspecified hip: Secondary | ICD-10-CM | POA: Diagnosis not present

## 2016-09-02 DIAGNOSIS — Z96642 Presence of left artificial hip joint: Secondary | ICD-10-CM | POA: Diagnosis not present

## 2016-09-02 DIAGNOSIS — G629 Polyneuropathy, unspecified: Secondary | ICD-10-CM | POA: Diagnosis not present

## 2016-09-02 DIAGNOSIS — M4800 Spinal stenosis, site unspecified: Secondary | ICD-10-CM | POA: Diagnosis not present

## 2016-09-02 DIAGNOSIS — M169 Osteoarthritis of hip, unspecified: Secondary | ICD-10-CM | POA: Diagnosis not present

## 2016-09-02 NOTE — Telephone Encounter (Signed)
I have made the 2nd attempt to contact the patient or family member in charge, in order to follow up from recently being discharged from the hospital. I left a message on voicemail but I will make another attempt at a different time.  

## 2016-09-03 ENCOUNTER — Telehealth: Payer: Self-pay | Admitting: Nurse Practitioner

## 2016-09-03 NOTE — Telephone Encounter (Signed)
I have made the 3rd and final attempt to contact the patient or family member in charge, in order to follow up from recently being discharged from the hospital. I left a message on voicemail.

## 2016-09-03 NOTE — Telephone Encounter (Signed)
[facility] called [phone] Kathie RhodesBetty, called states that pt have fallen 3 since Saturday, son was requesting a hospital bed, [facility] was reqeusting PT.

## 2016-09-03 NOTE — Telephone Encounter (Signed)
[facility] and treat.

## 2016-09-04 ENCOUNTER — Other Ambulatory Visit: Payer: Self-pay | Admitting: Nurse Practitioner

## 2016-09-04 DIAGNOSIS — E441 Mild protein-calorie malnutrition: Secondary | ICD-10-CM | POA: Diagnosis not present

## 2016-09-04 DIAGNOSIS — K120 Recurrent oral aphthae: Secondary | ICD-10-CM | POA: Diagnosis not present

## 2016-09-04 DIAGNOSIS — A6009 Herpesviral infection of other urogenital tract: Secondary | ICD-10-CM | POA: Diagnosis not present

## 2016-09-04 DIAGNOSIS — Z87891 Personal history of nicotine dependence: Secondary | ICD-10-CM | POA: Diagnosis not present

## 2016-09-04 DIAGNOSIS — J302 Other seasonal allergic rhinitis: Secondary | ICD-10-CM | POA: Diagnosis not present

## 2016-09-04 DIAGNOSIS — Z0000 Encounter for general adult medical examination without abnormal findings: Secondary | ICD-10-CM

## 2016-09-04 DIAGNOSIS — G8929 Other chronic pain: Secondary | ICD-10-CM | POA: Diagnosis not present

## 2016-09-04 NOTE — Telephone Encounter (Signed)
Left detailed message for PT verbal.

## 2016-09-05 ENCOUNTER — Other Ambulatory Visit: Payer: Medicare HMO

## 2016-09-06 ENCOUNTER — Inpatient Hospital Stay
Admission: EM | Admit: 2016-09-06 | Discharge: 2016-09-09 | DRG: 312 | Disposition: A | Discharge status: Home Health | Payer: Medicare HMO | Attending: Internal Medicine | Admitting: Internal Medicine

## 2016-09-06 ENCOUNTER — Emergency Department: Payer: Medicare HMO

## 2016-09-06 ENCOUNTER — Other Ambulatory Visit: Payer: Self-pay

## 2016-09-06 DIAGNOSIS — Z96642 Presence of left artificial hip joint: Secondary | ICD-10-CM | POA: Diagnosis present

## 2016-09-06 DIAGNOSIS — E785 Hyperlipidemia, unspecified: Secondary | ICD-10-CM | POA: Diagnosis present

## 2016-09-06 DIAGNOSIS — Z79899 Other long term (current) drug therapy: Secondary | ICD-10-CM

## 2016-09-06 DIAGNOSIS — E871 Hypo-osmolality and hyponatremia: Secondary | ICD-10-CM | POA: Diagnosis present

## 2016-09-06 DIAGNOSIS — R251 Tremor, unspecified: Secondary | ICD-10-CM | POA: Diagnosis not present

## 2016-09-06 DIAGNOSIS — G8929 Other chronic pain: Secondary | ICD-10-CM | POA: Diagnosis present

## 2016-09-06 DIAGNOSIS — Z79891 Long term (current) use of opiate analgesic: Secondary | ICD-10-CM | POA: Diagnosis not present

## 2016-09-06 DIAGNOSIS — I10 Essential (primary) hypertension: Secondary | ICD-10-CM | POA: Diagnosis not present

## 2016-09-06 DIAGNOSIS — E876 Hypokalemia: Secondary | ICD-10-CM | POA: Diagnosis not present

## 2016-09-06 DIAGNOSIS — R531 Weakness: Secondary | ICD-10-CM | POA: Diagnosis not present

## 2016-09-06 DIAGNOSIS — I951 Orthostatic hypotension: Secondary | ICD-10-CM | POA: Diagnosis not present

## 2016-09-06 DIAGNOSIS — R509 Fever, unspecified: Secondary | ICD-10-CM | POA: Diagnosis not present

## 2016-09-06 DIAGNOSIS — F3160 Bipolar disorder, current episode mixed, unspecified: Secondary | ICD-10-CM | POA: Diagnosis present

## 2016-09-06 DIAGNOSIS — E039 Hypothyroidism, unspecified: Secondary | ICD-10-CM | POA: Diagnosis present

## 2016-09-06 DIAGNOSIS — Z888 Allergy status to other drugs, medicaments and biological substances status: Secondary | ICD-10-CM

## 2016-09-06 DIAGNOSIS — Z7982 Long term (current) use of aspirin: Secondary | ICD-10-CM

## 2016-09-06 DIAGNOSIS — Z8249 Family history of ischemic heart disease and other diseases of the circulatory system: Secondary | ICD-10-CM

## 2016-09-06 DIAGNOSIS — R42 Dizziness and giddiness: Secondary | ICD-10-CM | POA: Diagnosis not present

## 2016-09-06 DIAGNOSIS — Z87891 Personal history of nicotine dependence: Secondary | ICD-10-CM | POA: Diagnosis not present

## 2016-09-06 DIAGNOSIS — Z87442 Personal history of urinary calculi: Secondary | ICD-10-CM

## 2016-09-06 DIAGNOSIS — I959 Hypotension, unspecified: Secondary | ICD-10-CM | POA: Diagnosis not present

## 2016-09-06 DIAGNOSIS — Z885 Allergy status to narcotic agent status: Secondary | ICD-10-CM | POA: Diagnosis not present

## 2016-09-06 DIAGNOSIS — R296 Repeated falls: Secondary | ICD-10-CM | POA: Diagnosis not present

## 2016-09-06 DIAGNOSIS — R262 Difficulty in walking, not elsewhere classified: Secondary | ICD-10-CM | POA: Diagnosis not present

## 2016-09-06 DIAGNOSIS — E2740 Unspecified adrenocortical insufficiency: Secondary | ICD-10-CM | POA: Diagnosis present

## 2016-09-06 DIAGNOSIS — M545 Low back pain: Secondary | ICD-10-CM | POA: Diagnosis present

## 2016-09-06 DIAGNOSIS — R220 Localized swelling, mass and lump, head: Secondary | ICD-10-CM | POA: Diagnosis not present

## 2016-09-06 LAB — COMPREHENSIVE METABOLIC PANEL
ALT: 26 U/L (ref 14–54)
ANION GAP: 9 (ref 5–15)
AST: 49 U/L — AB (ref 15–41)
Albumin: 2.5 g/dL — ABNORMAL LOW (ref 3.5–5.0)
Alkaline Phosphatase: 127 U/L — ABNORMAL HIGH (ref 38–126)
BILIRUBIN TOTAL: 0.4 mg/dL (ref 0.3–1.2)
CHLORIDE: 93 mmol/L — AB (ref 101–111)
CO2: 25 mmol/L (ref 22–32)
Calcium: 8.7 mg/dL — ABNORMAL LOW (ref 8.9–10.3)
Creatinine, Ser: 0.63 mg/dL (ref 0.44–1.00)
Glucose, Bld: 78 mg/dL (ref 65–99)
POTASSIUM: 3.3 mmol/L — AB (ref 3.5–5.1)
Sodium: 127 mmol/L — ABNORMAL LOW (ref 135–145)
TOTAL PROTEIN: 6.5 g/dL (ref 6.5–8.1)

## 2016-09-06 LAB — CORTISOL: CORTISOL PLASMA: 15 ug/dL

## 2016-09-06 LAB — CBC WITH DIFFERENTIAL/PLATELET
BASOS ABS: 0 10*3/uL (ref 0–0.1)
BASOS PCT: 0 %
Eosinophils Absolute: 0 10*3/uL (ref 0–0.7)
Eosinophils Relative: 1 %
HEMATOCRIT: 33.9 % — AB (ref 35.0–47.0)
Hemoglobin: 11.5 g/dL — ABNORMAL LOW (ref 12.0–16.0)
Lymphocytes Relative: 29 %
Lymphs Abs: 1.1 10*3/uL (ref 1.0–3.6)
MCH: 30.2 pg (ref 26.0–34.0)
MCHC: 33.9 g/dL (ref 32.0–36.0)
MCV: 89.2 fL (ref 80.0–100.0)
MONO ABS: 0.5 10*3/uL (ref 0.2–0.9)
Monocytes Relative: 13 %
Neutro Abs: 2.1 10*3/uL (ref 1.4–6.5)
Neutrophils Relative %: 57 %
PLATELETS: 434 10*3/uL (ref 150–440)
RBC: 3.81 MIL/uL (ref 3.80–5.20)
RDW: 14.8 % — AB (ref 11.5–14.5)
WBC: 3.7 10*3/uL (ref 3.6–11.0)

## 2016-09-06 LAB — BLOOD GAS, VENOUS
Acid-Base Excess: 1.9 mmol/L (ref 0.0–2.0)
BICARBONATE: 26.6 mmol/L (ref 20.0–28.0)
PATIENT TEMPERATURE: 37
PCO2 VEN: 41 mmHg — AB (ref 44.0–60.0)
pH, Ven: 7.42 (ref 7.250–7.430)

## 2016-09-06 LAB — URINALYSIS, COMPLETE (UACMP) WITH MICROSCOPIC
BACTERIA UA: NONE SEEN
BILIRUBIN URINE: NEGATIVE
Glucose, UA: NEGATIVE mg/dL
Hgb urine dipstick: NEGATIVE
KETONES UR: 5 mg/dL — AB
LEUKOCYTES UA: NEGATIVE
NITRITE: NEGATIVE
PROTEIN: NEGATIVE mg/dL
RBC / HPF: NONE SEEN RBC/hpf (ref 0–5)
Specific Gravity, Urine: 1.004 — ABNORMAL LOW (ref 1.005–1.030)
pH: 7 (ref 5.0–8.0)

## 2016-09-06 LAB — TSH: TSH: 1.032 u[IU]/mL (ref 0.350–4.500)

## 2016-09-06 LAB — VALPROIC ACID LEVEL: VALPROIC ACID LVL: 45 ug/mL — AB (ref 50.0–100.0)

## 2016-09-06 LAB — LACTIC ACID, PLASMA: LACTIC ACID, VENOUS: 0.6 mmol/L (ref 0.5–1.9)

## 2016-09-06 LAB — TROPONIN I

## 2016-09-06 MED ORDER — SODIUM CHLORIDE 0.9% FLUSH
3.0000 mL | Freq: Two times a day (BID) | INTRAVENOUS | Status: DC
  Administered 2016-09-06 – 2016-09-09 (×6): 3 mL via INTRAVENOUS

## 2016-09-06 MED ORDER — SODIUM CHLORIDE 0.9 % IV SOLN: 250.0000 mL | INTRAVENOUS | Status: DC | PRN

## 2016-09-06 MED ORDER — HYDROCORTISONE 5 MG PO TABS
5.0000 mg | ORAL_TABLET | Freq: Every day | ORAL | Status: DC
  Administered 2016-09-06 – 2016-09-09 (×4): 5 mg via ORAL
  Filled 2016-09-06 (×5): qty 1

## 2016-09-06 MED ORDER — BISACODYL 5 MG PO TBEC
5.0000 mg | DELAYED_RELEASE_TABLET | Freq: Every evening | ORAL | Status: DC | PRN
  Administered 2016-09-07: 5 mg via ORAL
  Filled 2016-09-06: qty 1

## 2016-09-06 MED ORDER — CHLORHEXIDINE GLUCONATE 0.12 % MT SOLN
15.0000 mL | Freq: Two times a day (BID) | OROMUCOSAL | Status: DC
  Administered 2016-09-07 – 2016-09-09 (×5): 15 mL via OROMUCOSAL
  Filled 2016-09-06 (×4): qty 15

## 2016-09-06 MED ORDER — MONTELUKAST SODIUM 10 MG PO TABS
10.0000 mg | ORAL_TABLET | Freq: Every day | ORAL | Status: DC
  Administered 2016-09-07 – 2016-09-08 (×2): 10 mg via ORAL
  Filled 2016-09-06 (×2): qty 1

## 2016-09-06 MED ORDER — AMITRIPTYLINE HCL 25 MG PO TABS
50.0000 mg | ORAL_TABLET | Freq: Every day | ORAL | Status: DC
  Administered 2016-09-06 – 2016-09-07 (×2): 50 mg via ORAL
  Filled 2016-09-06 (×2): qty 2

## 2016-09-06 MED ORDER — ORAL CARE MOUTH RINSE: 15.0000 mL | Freq: Two times a day (BID) | OROMUCOSAL | Status: DC

## 2016-09-06 MED ORDER — SIMVASTATIN 20 MG PO TABS
40.0000 mg | ORAL_TABLET | Freq: Every day | ORAL | Status: DC
  Administered 2016-09-06 – 2016-09-08 (×3): 40 mg via ORAL
  Filled 2016-09-06 (×3): qty 2

## 2016-09-06 MED ORDER — SODIUM CHLORIDE 0.9 % IV SOLN
Freq: Once | INTRAVENOUS | Status: AC
Start: 2016-09-06 — End: 2016-09-06
  Administered 2016-09-06: 18:00:00 via INTRAVENOUS

## 2016-09-06 MED ORDER — SENNOSIDES-DOCUSATE SODIUM 8.6-50 MG PO TABS
2.0000 | ORAL_TABLET | Freq: Two times a day (BID) | ORAL | Status: DC
  Administered 2016-09-06 – 2016-09-09 (×6): 2 via ORAL
  Filled 2016-09-06 (×6): qty 2

## 2016-09-06 MED ORDER — ADULT MULTIVITAMIN W/MINERALS CH
1.0000 | ORAL_TABLET | Freq: Every day | ORAL | Status: DC
  Administered 2016-09-07 – 2016-09-09 (×3): 1 via ORAL
  Filled 2016-09-06 (×3): qty 1

## 2016-09-06 MED ORDER — ACETAMINOPHEN 325 MG PO TABS
650.0000 mg | ORAL_TABLET | Freq: Four times a day (QID) | ORAL | Status: DC | PRN
Start: 2016-09-06 — End: 2016-09-09
  Administered 2016-09-09: 650 mg via ORAL
  Filled 2016-09-06: qty 2

## 2016-09-06 MED ORDER — OXYBUTYNIN CHLORIDE ER 5 MG PO TB24
5.0000 mg | ORAL_TABLET | Freq: Every day | ORAL | Status: DC
  Administered 2016-09-07 – 2016-09-08 (×2): 5 mg via ORAL
  Filled 2016-09-06 (×2): qty 1

## 2016-09-06 MED ORDER — SERTRALINE HCL 50 MG PO TABS
200.0000 mg | ORAL_TABLET | Freq: Every day | ORAL | Status: DC
  Administered 2016-09-07 – 2016-09-09 (×3): 200 mg via ORAL
  Filled 2016-09-06 (×3): qty 4

## 2016-09-06 MED ORDER — HYDROCODONE-ACETAMINOPHEN 5-325 MG PO TABS
1.0000 | ORAL_TABLET | Freq: Four times a day (QID) | ORAL | Status: DC | PRN
  Administered 2016-09-06 – 2016-09-09 (×5): 1 via ORAL
  Filled 2016-09-06 (×5): qty 1

## 2016-09-06 MED ORDER — DIVALPROEX SODIUM 500 MG PO DR TAB
500.0000 mg | DELAYED_RELEASE_TABLET | Freq: Two times a day (BID) | ORAL | Status: DC
  Administered 2016-09-06 – 2016-09-09 (×6): 500 mg via ORAL
  Filled 2016-09-06 (×8): qty 1

## 2016-09-06 MED ORDER — ENOXAPARIN SODIUM 40 MG/0.4ML ~~LOC~~ SOLN
40.0000 mg | SUBCUTANEOUS | Status: DC
  Administered 2016-09-06 – 2016-09-08 (×3): 40 mg via SUBCUTANEOUS
  Filled 2016-09-06 (×3): qty 0.4

## 2016-09-06 MED ORDER — ASPIRIN EC 81 MG PO TBEC
81.0000 mg | DELAYED_RELEASE_TABLET | Freq: Every day | ORAL | Status: DC
  Administered 2016-09-07 – 2016-09-09 (×3): 81 mg via ORAL
  Filled 2016-09-06 (×3): qty 1

## 2016-09-06 MED ORDER — CALCIUM CARBONATE-VITAMIN D 500-200 MG-UNIT PO TABS
1.0000 | ORAL_TABLET | Freq: Every day | ORAL | Status: DC
  Administered 2016-09-07 – 2016-09-09 (×3): 1 via ORAL
  Filled 2016-09-06 (×3): qty 1

## 2016-09-06 MED ORDER — SODIUM CHLORIDE 0.9% FLUSH: 3.0000 mL | INTRAVENOUS | Status: DC | PRN

## 2016-09-06 MED ORDER — ALBUTEROL SULFATE (2.5 MG/3ML) 0.083% IN NEBU: 3.0000 mL | INHALATION_SOLUTION | Freq: Four times a day (QID) | RESPIRATORY_TRACT | Status: DC | PRN

## 2016-09-06 NOTE — H&P (Signed)
Kelly Foster is an 66 y.o. female.   Chief Complaint: Falls HPI: This is 65 year old female who was recently hospitalized with UTI, hyponatremia and orthostatic hypotension. She was treated for her UTI sodium was corrected however she left AMA. Since then she's had repeated falls and comes in today saying she gets dizzy lightheaded and then falls. Here she's found to have her systolic blood pressure drop by 40 points with standing. She also complains of being nauseated and drinking a lot of water. Her family members with her says she drinks multiple bottles of water every day.  Past Medical History:  Diagnosis Date  . Adrenal insufficiency ([place])   . Allergy   . Anxiety   . Arthritis   . Bipolar 1 disorder ([place])   . Candidiasis of mouth and esophagus ([place]) 08/13/2016  . Central spinal stenosis   . Cervical radiculopathy, chronic   . Chronic adrenal insufficiency ([place]) 06/06/2013  . Chronic low back pain without sciatica   . Chronic pain syndrome   . Depression   . Disorder of left sacroiliac joint   . DJD (degenerative joint disease)   . Elevated CK   . Hemorrhoids 07/11/2016  . Hip pain   . History of kidney stones   . Hyperlipidemia   . Hypertension   . Hyponatremia   . Hypothyroidism   . Lower urinary tract infection 06/06/2013   Overview:  IMO Problem List Replacer Jan. 2016   . Lumbar radiculitis   . Manic depressive disorder ([place])   . Neuritis or radiculitis due to rupture of lumbar intervertebral disc 05/29/2014  . OA (osteoarthritis) of hip   . Overweight   . Prediabetes   . Reflux esophagitis   . Sacrococcygeal disorders, not elsewhere classified   . Sacroiliac joint dysfunction 04/05/2015  . Trochanteric bursitis of left hip   . Ulnar neuropathy   . Urinary frequency   . Vaginal atrophy     Past Surgical History:  Procedure Laterality Date  . ABDOMINAL HYSTERECTOMY    . arm surgery Left   . CESAREAN SECTION    . JOINT REPLACEMENT Left 2016   dec  . TOTAL HIP  ARTHROPLASTY Left 12/26/2014   Procedure: TOTAL HIP ARTHROPLASTY ANTERIOR APPROACH;  Surgeon: Hessie Knows, MD;  Location: [facility] ORS;  Service: Orthopedics;  Laterality: Left;    Family History  Problem Relation Age of Onset  . Alzheimer's disease Mother   . Arthritis Mother   . Stroke Father   . Hypertension Father   . Colon cancer Sister   . Hypertension Sister   . Kidney disease Neg Hx    Social History:  reports that she quit smoking about 21 years ago. Her smoking use included Cigarettes. She smoked 1.00 pack per day. She has never used smokeless tobacco. She reports that she does not drink alcohol or use drugs.  Allergies:  Allergies  Allergen Reactions  . Gabapentin Other (See Comments)    tremors tremors  . Codeine Itching  . Pregabalin Other (See Comments)    Left side pain under arm, started after use of lyrica  . Tramadol Itching     (Not in a hospital admission)  Results for orders placed or performed during the hospital encounter of 09/06/16 (from the past 48 hour(s))  Lactic acid, plasma     Status: None   Collection Time: 09/06/16  3:39 PM  Result Value Ref Range   Lactic Acid, Venous 0.6 0.5 - 1.9 mmol/L  Comprehensive metabolic panel  Status: Abnormal   Collection Time: 09/06/16  3:40 PM  Result Value Ref Range   Sodium 127 (L) 135 - 145 mmol/L   Potassium 3.3 (L) 3.5 - 5.1 mmol/L   Chloride 93 (L) 101 - 111 mmol/L   CO2 25 22 - 32 mmol/L   Glucose, Bld 78 65 - 99 mg/dL   BUN <5 (L) 6 - 20 mg/dL   Creatinine, Ser 0.63 0.44 - 1.00 mg/dL   Calcium 8.7 (L) 8.9 - 10.3 mg/dL   Total Protein 6.5 6.5 - 8.1 g/dL   Albumin 2.5 (L) 3.5 - 5.0 g/dL   AST 49 (H) 15 - 41 U/L   ALT 26 14 - 54 U/L   Alkaline Phosphatase 127 (H) 38 - 126 U/L   Total Bilirubin 0.4 0.3 - 1.2 mg/dL   GFR calc non Af Amer >60 >60 mL/min   GFR calc Af Amer >60 >60 mL/min    Comment: (NOTE) The eGFR has been calculated using the CKD EPI equation. This calculation has not been  validated in all clinical situations. eGFR's persistently <60 mL/min signify possible Chronic Kidney Disease.    Anion gap 9 5 - 15  Troponin I     Status: None   Collection Time: 09/06/16  3:40 PM  Result Value Ref Range   Troponin I <0.03 <0.03 ng/mL  CBC WITH DIFFERENTIAL     Status: Abnormal   Collection Time: 09/06/16  3:40 PM  Result Value Ref Range   WBC 3.7 3.6 - 11.0 K/uL   RBC 3.81 3.80 - 5.20 MIL/uL   Hemoglobin 11.5 (L) 12.0 - 16.0 g/dL   HCT 33.9 (L) 35.0 - 47.0 %   MCV 89.2 80.0 - 100.0 fL   MCH 30.2 26.0 - 34.0 pg   MCHC 33.9 32.0 - 36.0 g/dL   RDW 14.8 (H) 11.5 - 14.5 %   Platelets 434 150 - 440 K/uL   Neutrophils Relative % 57 %   Neutro Abs 2.1 1.4 - 6.5 K/uL   Lymphocytes Relative 29 %   Lymphs Abs 1.1 1.0 - 3.6 K/uL   Monocytes Relative 13 %   Monocytes Absolute 0.5 0.2 - 0.9 K/uL   Eosinophils Relative 1 %   Eosinophils Absolute 0.0 0 - 0.7 K/uL   Basophils Relative 0 %   Basophils Absolute 0.0 0 - 0.1 K/uL  Urinalysis, Complete w Microscopic     Status: Abnormal   Collection Time: 09/06/16  3:40 PM  Result Value Ref Range   Color, Urine STRAW (A) YELLOW   APPearance CLEAR (A) CLEAR   Specific Gravity, Urine 1.004 (L) 1.005 - 1.030   pH 7.0 5.0 - 8.0   Glucose, UA NEGATIVE NEGATIVE mg/dL   Hgb urine dipstick NEGATIVE NEGATIVE   Bilirubin Urine NEGATIVE NEGATIVE   Ketones, ur 5 (A) NEGATIVE mg/dL   Protein, ur NEGATIVE NEGATIVE mg/dL   Nitrite NEGATIVE NEGATIVE   Leukocytes, UA NEGATIVE NEGATIVE   RBC / HPF NONE SEEN 0 - 5 RBC/hpf   WBC, UA 0-5 0 - 5 WBC/hpf   Bacteria, UA NONE SEEN NONE SEEN   Squamous Epithelial / LPF 0-5 (A) NONE SEEN  Valproic acid level     Status: Abnormal   Collection Time: 09/06/16  3:40 PM  Result Value Ref Range   Valproic Acid Lvl 45 (L) 50.0 - 100.0 ug/mL  Blood gas, venous ([facility], [facility], [facility])     Status: Abnormal   Collection Time: 09/06/16  3:41 PM  Result Value Ref Range   pH, Ven 7.42 7.250 - 7.430   pCO2, Ven 41  (L) 44.0 - 60.0 mmHg   pO2, Ven <31.0 (LL) 32.0 - 45.0 mmHg    Comment: VENOUS   Bicarbonate 26.6 20.0 - 28.0 mmol/L   Acid-Base Excess 1.9 0.0 - 2.0 mmol/L   Patient temperature 37.0    Collection site VEIN    Sample type VENOUS    Ct Head Wo Contrast  Result Date: 09/06/2016 CLINICAL DATA:  Increased weakness and increased falling in past week. EXAM: CT HEAD WITHOUT CONTRAST TECHNIQUE: Contiguous axial images were obtained from the base of the skull through the vertex without intravenous contrast. COMPARISON:  08/28/2016 FINDINGS: Brain: No evidence for acute hemorrhage, mass lesion, midline shift, hydrocephalus or large infarct. Vascular: No hyperdense vessel or unexpected calcification. Skull: Normal. Negative for fracture or focal lesion. Sinuses/Orbits: No acute finding. Other: Soft tissue swelling along the right posterior upper scalp. IMPRESSION: No acute intracranial abnormality. Swelling along the posterior upper right scalp. No underlying fracture. Electronically Signed   By: Markus Daft M.D.   On: 09/06/2016 16:41   Dg Chest Port 1 View  Result Date: 09/06/2016 CLINICAL DATA:  66 year old female with fever, increased weakness and recent falls. EXAM: PORTABLE CHEST 1 VIEW COMPARISON:  08/28/2016. FINDINGS: Portable AP upright view at 1543 hours. Mildly lower lung volumes. Allowing for portable technique the lungs remain clear. No pneumothorax or pleural effusion. Mediastinal contours remain within normal limits. Visualized tracheal air column is within normal limits. Negative visible bowel gas pattern. Chronic left posterior rib fractures again noted. No acute osseous abnormality identified. IMPRESSION: Lower lung volumes.  No acute cardiopulmonary abnormality. Electronically Signed   By: Genevie Ann M.D.   On: 09/06/2016 16:05    Review of Systems  Constitutional: Negative for fever.  HENT: Negative for hearing loss.   Eyes: Negative for blurred vision.  Respiratory: Negative for cough.    Cardiovascular: Negative for chest pain.  Gastrointestinal: Positive for nausea.  Genitourinary: Negative for dysuria.  Musculoskeletal: Negative for myalgias.  Skin: Negative for rash.  Neurological: Negative for dizziness.  Psychiatric/Behavioral: Positive for depression.    Blood pressure (!) 145/77, pulse 88, temperature 98.4 F (36.9 C), temperature source Oral, resp. rate (!) 22, height 5' 7" (1.702 m), weight 81.6 kg (180 lb), SpO2 96 %. Physical Exam  Constitutional: She is oriented to person, place, and time. She appears well-developed and well-nourished. No distress.  HENT:  Head: Normocephalic and atraumatic.  Mouth/Throat: Oropharynx is clear and moist. No oropharyngeal exudate.  Eyes: Pupils are equal, round, and reactive to light. No scleral icterus.  Neck: Neck supple. No JVD present. No tracheal deviation present. No thyromegaly present.  Cardiovascular: Normal rate.   No murmur heard. Respiratory: Effort normal and breath sounds normal. No respiratory distress.  GI: Soft. Bowel sounds are normal. There is no tenderness.  Musculoskeletal: She exhibits edema. She exhibits no tenderness.  Lymphadenopathy:    She has no cervical adenopathy.  Neurological: She is alert and oriented to person, place, and time.  Skin: Skin is warm and dry.     Assessment/Plan 1. Orthostatic hypotension. As stated above patient's systolic blood pressure drops 40 points and standing. She gets symptomatic. She appears to be euvolemic. She has a low BUN to creatinine ratio. Looking through old records found a diagnosis of chronic adrenal insufficiency. Suspect this may be contributing to this. I'll going check a cortisol level and a TSH. However ongoing  and restart her on her hydrocortisone that she was on before. 2. Chronic adrenal insufficiency. Patient does not recall this diagnosis and does not recall being on sterods. However I did find a 2015 admission record to [facility]. During that admission  she had an abnormal cosyntropin test and was started on hydrocortisone. Not sure when this medication was dropped off her list. However we'll go ahead and restart this. 3. Hyponatremia. Mild. She has been drinking a lot of water. She has a low BUN to creatinine ratio. I suspect mild water intoxication. We'll hold off on any IV fluids now and recheck this in the morning. Also her adrenal insufficiency could contribute. 4. Hyponatremia. We'll give her 1 dose of potassium. 5. Bipolar disorder. Continue her current medications next line Total time spent 45 minutes  Baxter Hire, MD 09/06/2016, 6:43 PM

## 2016-09-06 NOTE — ED Triage Notes (Signed)
Pt arrived via [facility] from home.  Pt having increased weakness and increased falling in past week.  Pt had recent admission for UTI to hospital and left AMA after 2 days.  Pt was give oral antibiotics to take at home.  Pt has fallen and hit head numerous times.  Pt A&Ox4.  Pt lives with son at home.  Pt has scattered bruising over all of body and pt also has swelling to L leg.

## 2016-09-06 NOTE — ED Provider Notes (Signed)
[facility] Emergency Department Provider Note       Time seen: ----------------------------------------- 4:02 PM on 09/06/2016 -----------------------------------------     I have reviewed the triage vital signs and the nursing notes.   HISTORY   Chief Complaint Weakness    HPI Kelly Foster is a 66 y.o. female who presents to the ED for increased weakness with several falls in the past week. Recently she was admitted for UTI and she left AGAINST MEDICAL ADVICE after 2 days. Patient was given oral antibiotics to take at home but she states she has finished taking these. She has fallen and hit her head numerous times. She lives with her son at home, also has had some swelling to the left leg.   Past Medical History:  Diagnosis Date  . Adrenal insufficiency ([facility])   . Allergy   . Anxiety   . Arthritis   . Bipolar 1 disorder ([facility])   . Candidiasis of mouth and esophagus ([facility]) 08/13/2016  . Central spinal stenosis   . Cervical radiculopathy, chronic   . Chronic adrenal insufficiency ([facility]) 06/06/2013  . Chronic low back pain without sciatica   . Chronic pain syndrome   . Depression   . Disorder of left sacroiliac joint   . DJD (degenerative joint disease)   . Elevated CK   . Hemorrhoids 07/11/2016  . Hip pain   . History of kidney stones   . Hyperlipidemia   . Hypertension   . Hyponatremia   . Hypothyroidism   . Lower urinary tract infection 06/06/2013   Overview:  IMO Problem List Replacer Jan. 2016   . Lumbar radiculitis   . Manic depressive disorder ([facility])   . Neuritis or radiculitis due to rupture of lumbar intervertebral disc 05/29/2014  . OA (osteoarthritis) of hip   . Overweight   . Prediabetes   . Reflux esophagitis   . Sacrococcygeal disorders, not elsewhere classified   . Sacroiliac joint dysfunction 04/05/2015  . Trochanteric bursitis of left hip   . Ulnar neuropathy   . Urinary frequency   . Vaginal atrophy     Patient Active  Problem List   Diagnosis Date Noted  . Failure to thrive in adult 08/28/2016  . Orthostatic hypotension 08/28/2016  . Hyponatremia 08/28/2016  . Hyperglycemia 08/28/2016  . Pyuria 08/28/2016  . Protein malnutrition ([facility]) 08/13/2016  . Hemorrhoids 07/11/2016  . Hyperlipidemia   . Depression   . Central spinal stenosis   . Cervical radiculopathy, chronic   . OA (osteoarthritis) of hip   . Chronic pain syndrome   . Chronic low back pain without sciatica   . Hypothyroidism   . Reflux esophagitis   . Sacroiliac joint dysfunction 04/05/2015  . 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 Intractable pain 12/20/2014  . Neuritis or radiculitis due to rupture of lumbar intervertebral disc 05/29/2014  . Lumbar radiculitis 05/29/2014  . Bipolar 1 disorder, mixed ([facility]) 06/06/2013  . Chronic adrenal insufficiency ([facility]) 06/06/2013  . Elevated CK 06/06/2013  . Below normal amount of sodium in the blood 06/06/2013  . Adynamia 06/06/2013    Past Surgical History:  Procedure Laterality Date  . ABDOMINAL HYSTERECTOMY    . arm surgery Left   . CESAREAN SECTION    . JOINT REPLACEMENT Left 2016   dec  . TOTAL HIP ARTHROPLASTY Left 12/26/2014   Procedure: TOTAL HIP ARTHROPLASTY ANTERIOR APPROACH;  Surgeon: Kennedy Bucker, MD;  Location: [facility] ORS;  Service: Orthopedics;  Laterality:  Left;    Allergies Gabapentin; Codeine; Pregabalin; and Tramadol  Social History Social History  Substance Use Topics  . Smoking status: Former Smoker    Packs/day: 1.00    Types: Cigarettes    Quit date: 03/01/1995  . Smokeless tobacco: Never Used     Comment: quit 1998  . Alcohol use No    Review of Systems Constitutional: Negative for fever. Eyes: Negative for vision changes ENT:  Negative for congestion, sore throat Cardiovascular: Negative for chest pain. Respiratory: Negative for shortness of breath. Gastrointestinal: Negative for abdominal pain, vomiting and diarrhea. Genitourinary:  Negative for dysuria. Musculoskeletal: Negative for back pain.Positive for left leg swelling Skin: Negative for rash. Neurological: Positive for generalized weakness  All systems negative/normal/unremarkable except as stated in the HPI  ____________________________________________   PHYSICAL EXAM:  VITAL SIGNS: ED Triage Vitals  Enc Vitals Group     BP 09/06/16 1531 (!) 145/77     Pulse Rate 09/06/16 1531 88     Resp 09/06/16 1531 (!) 22     Temp 09/06/16 1531 98.4 F (36.9 C)     Temp Source 09/06/16 1531 Oral     SpO2 09/06/16 1531 96 %     Weight 09/06/16 1532 180 lb (81.6 kg)     Height 09/06/16 1532 5\' 7"  (1.702 m)     Head Circumference --      Peak Flow --      Pain Score 09/06/16 1517 10     Pain Loc --      Pain Edu? --      Excl. in GC? --     Constitutional: Alert and oriented. Well appearing and in no distress. Eyes: Conjunctivae are normal. Normal extraocular movements. ENT   Head: Normocephalic and atraumatic.   Nose: No congestion/rhinnorhea.   Mouth/Throat: Mucous membranes are moist.   Neck: No stridor. Cardiovascular: Normal rate, regular rhythm. No murmurs, rubs, or gallops. Respiratory: Normal respiratory effort without tachypnea nor retractions. Breath sounds are clear and equal bilaterally. No wheezes/rales/rhonchi. Gastrointestinal: Soft and nontender. Normal bowel sounds Musculoskeletal: Nontender with normal range of motion in extremities. No lower extremity tenderness nor edema. Neurologic:  Normal speech and language. No gross focal neurologic deficits are appreciated. Generalized weakness, nothing focal Skin:  Scattered contusions are noted over the extremities Psychiatric: Mood and affect are normal. Speech and behavior are normal.  ____________________________________________  EKG: Interpreted by me.Normal sinus rhythm rate of 69 bpm, nonspecific T wave changes, normal QRS, normal  QT.  ____________________________________________  ED COURSE:  Pertinent labs & imaging results that were available during my care of the patient were reviewed by me and considered in my medical decision making (see chart for details). Patient presents for weakness and worsening falls, we will assess with labs and imaging as indicated. Clinical Course as of Sep 06 1736  Sat Sep 06, 2016  1732 Significant orthostasis noted.   [JW]    Clinical Course User Index [JW] Emily FilbertWilliams, Shanigua Gibb E, MD   Procedures ____________________________________________   LABS (pertinent positives/negatives)  Labs Reviewed  COMPREHENSIVE METABOLIC PANEL - Abnormal; Notable for the following:       Result Value   Sodium 127 (*)    Potassium 3.3 (*)    Chloride 93 (*)    BUN <5 (*)    Calcium 8.7 (*)    Albumin 2.5 (*)    AST 49 (*)    Alkaline Phosphatase 127 (*)    All other components within normal limits  CBC WITH DIFFERENTIAL/PLATELET - Abnormal; Notable for the following:    Hemoglobin 11.5 (*)    HCT 33.9 (*)    RDW 14.8 (*)    All other components within normal limits  BLOOD GAS, VENOUS - Abnormal; Notable for the following:    pCO2, Ven 41 (*)    pO2, Ven <31.0 (*)    All other components within normal limits  URINALYSIS, COMPLETE (UACMP) WITH MICROSCOPIC - Abnormal; Notable for the following:    Color, Urine STRAW (*)    APPearance CLEAR (*)    Specific Gravity, Urine 1.004 (*)    Ketones, ur 5 (*)    Squamous Epithelial / LPF 0-5 (*)    All other components within normal limits  VALPROIC ACID LEVEL - Abnormal; Notable for the following:    Valproic Acid Lvl 45 (*)    All other components within normal limits  CULTURE, BLOOD (ROUTINE X 2)  CULTURE, BLOOD (ROUTINE X 2)  URINE CULTURE  LACTIC ACID, PLASMA  TROPONIN I  LACTIC ACID, PLASMA    RADIOLOGY Images were viewed by me  CT head Is negative for acute abnormality ____________________________________________  FINAL  ASSESSMENT AND PLAN  Weakness, frequent falls, hyponatremia, orthostatic hypotension  Plan: Patient's labs and imaging were dictated above. Patient had presented for recurrent falls. Patient reports leaving the hospital AMA last week and she reports she has fallen at least once a day and 4 times today. Family is concerned because of her frequent falls. She was profoundly orthostatic with a 40 point decrease in her blood pressure from lying to sitting. We have started IV fluids for this and for hyponatremia. I will discuss with the hospitalist for admission.   Emily FilbertWilliams, Miguel Christiana E, MD   Note: This note was generated in part or whole with voice recognition software. Voice recognition is usually quite accurate but there are transcription errors that can and very often do occur. I apologize for any typographical errors that were not detected and corrected.     Emily FilbertWilliams, Kailoni Vahle E, MD 09/06/16 [phone]

## 2016-09-07 DIAGNOSIS — E871 Hypo-osmolality and hyponatremia: Secondary | ICD-10-CM | POA: Diagnosis not present

## 2016-09-07 DIAGNOSIS — E876 Hypokalemia: Secondary | ICD-10-CM | POA: Diagnosis not present

## 2016-09-07 DIAGNOSIS — I951 Orthostatic hypotension: Secondary | ICD-10-CM | POA: Diagnosis not present

## 2016-09-07 DIAGNOSIS — E2740 Unspecified adrenocortical insufficiency: Secondary | ICD-10-CM | POA: Diagnosis not present

## 2016-09-07 LAB — BASIC METABOLIC PANEL
ANION GAP: 5 (ref 5–15)
CO2: 26 mmol/L (ref 22–32)
Calcium: 8 mg/dL — ABNORMAL LOW (ref 8.9–10.3)
Chloride: 101 mmol/L (ref 101–111)
Creatinine, Ser: 0.49 mg/dL (ref 0.44–1.00)
GFR calc Af Amer: >60 mL/min (ref >60)
GFR calc non Af Amer: >60 mL/min (ref >60)
GLUCOSE: 91 mg/dL (ref 65–99)
POTASSIUM: 3.4 mmol/L — AB (ref 3.5–5.1)
Sodium: 132 mmol/L — ABNORMAL LOW (ref 135–145)

## 2016-09-07 NOTE — Progress Notes (Signed)
[employer] [facility] - [place] [facility]   PATIENT NAME: Kelly Foster    MR#:  [id number]  DATE OF BIRTH:  04/30/1950  SUBJECTIVE:  CHIEF COMPLAINT:   Chief Complaint  Patient presents with  . Weakness     Recent admission for UTI and not feeling good for last 1-2 weeks at home. Came back with hyponatremia and fall at home and found to have severe orthostatic blood pressure drop.   Sodium level came up some, still continued to have significant drop in blood pressure.  REVIEW OF SYSTEMS:  CONSTITUTIONAL: No fever, positive for fatigue or weakness.  EYES: No blurred or double vision.  EARS, NOSE, AND THROAT: No tinnitus or ear pain.  RESPIRATORY: No cough, shortness of breath, wheezing or hemoptysis.  CARDIOVASCULAR: No chest pain, orthopnea, edema.  GASTROINTESTINAL: No nausea, vomiting, diarrhea or abdominal pain.  GENITOURINARY: No dysuria, hematuria.  ENDOCRINE: No polyuria, nocturia,  HEMATOLOGY: No anemia, easy bruising or bleeding SKIN: No rash or lesion. MUSCULOSKELETAL: No joint pain or arthritis.   NEUROLOGIC: No tingling, numbness, weakness.  PSYCHIATRY: No anxiety or depression.   ROS  DRUG ALLERGIES:   Allergies  Allergen Reactions  . Gabapentin Other (See Comments)    tremors tremors  . Codeine Itching  . Pregabalin Other (See Comments)    Left side pain under arm, started after use of lyrica  . Tramadol Itching    VITALS:  Blood pressure (!) 81/56, pulse (!) 128, temperature 99.3 F (37.4 C), temperature source Oral, resp. rate 16, height 5\' 7"  (1.702 m), weight 81.6 kg (180 lb), SpO2 100 %.  PHYSICAL EXAMINATION:  GENERAL:  66 y.o.-year-old patient lying in the bed with no acute distress.  EYES: Pupils equal, round, reactive to light and accommodation. No scleral icterus. Extraocular muscles intact.  HEENT: Head atraumatic, normocephalic. Oropharynx and nasopharynx clear. No teeth. NECK:  Supple, no jugular venous distention. No thyroid  enlargement, no tenderness.  LUNGS: Normal breath sounds bilaterally, no wheezing, rales,rhonchi or crepitation. No use of accessory muscles of respiration.  CARDIOVASCULAR: S1, S2 normal. No murmurs, rubs, or gallops.  ABDOMEN: Soft, nontender, nondistended. Bowel sounds present. No organomegaly or mass.  EXTREMITIES: No pedal edema, cyanosis, or clubbing.  NEUROLOGIC: Cranial nerves II through XII are intact. Muscle strength 4/5 in all extremities. Sensation intact. Gait not checked.  PSYCHIATRIC: The patient is alert and oriented x 3.  SKIN: No obvious rash, lesion, or ulcer.   Physical Exam LABORATORY PANEL:   CBC  Recent Labs Lab 09/06/16 1540  WBC 3.7  HGB 11.5*  HCT 33.9*  PLT 434   ------------------------------------------------------------------------------------------------------------------  Chemistries   Recent Labs Lab 09/06/16 1540 09/07/16 0343  NA 127* 132*  K 3.3* 3.4*  CL 93* 101  CO2 25 26  GLUCOSE 78 91  BUN <5* <5*  CREATININE 0.63 0.49  CALCIUM 8.7* 8.0*  AST 49*  --   ALT 26  --   ALKPHOS 127*  --   BILITOT 0.4  --    ------------------------------------------------------------------------------------------------------------------  Cardiac Enzymes  Recent Labs Lab 09/06/16 1540  TROPONINI <0.03   ------------------------------------------------------------------------------------------------------------------  RADIOLOGY:  Ct Head Wo Contrast  Result Date: 09/06/2016 CLINICAL DATA:  Increased weakness and increased falling in past week. EXAM: CT HEAD WITHOUT CONTRAST TECHNIQUE: Contiguous axial images were obtained from the base of the skull through the vertex without intravenous contrast. COMPARISON:  08/28/2016 FINDINGS: Brain: No evidence for acute hemorrhage, mass lesion, midline shift, hydrocephalus or large infarct. Vascular: No  hyperdense vessel or unexpected calcification. Skull: Normal. Negative for fracture or focal lesion.  Sinuses/Orbits: No acute finding. Other: Soft tissue swelling along the right posterior upper scalp. IMPRESSION: No acute intracranial abnormality. Swelling along the posterior upper right scalp. No underlying fracture. Electronically Signed   By: Richarda OverlieAdam  Henn M.D.   On: 09/06/2016 16:41   Dg Chest Port 1 View  Result Date: 09/06/2016 CLINICAL DATA:  66 year old female with fever, increased weakness and recent falls. EXAM: PORTABLE CHEST 1 VIEW COMPARISON:  08/28/2016. FINDINGS: Portable AP upright view at 1543 hours. Mildly lower lung volumes. Allowing for portable technique the lungs remain clear. No pneumothorax or pleural effusion. Mediastinal contours remain within normal limits. Visualized tracheal air column is within normal limits. Negative visible bowel gas pattern. Chronic left posterior rib fractures again noted. No acute osseous abnormality identified. IMPRESSION: Lower lung volumes.  No acute cardiopulmonary abnormality. Electronically Signed   By: Odessa FlemingH  Hall M.D.   On: 09/06/2016 16:05    ASSESSMENT AND PLAN:   Active Problems:   Orthostatic hypotension  1. Orthostatic hypotension.   systolic blood pressure drops 40 points on standing. She gets symptomatic. She appears to be euvolemic. She has a low BUN to creatinine ratio. Looking through old records found a diagnosis of chronic adrenal insufficiency. Suspect this may be contributing to this. normal cortisol level and a TSH. However ongoing and restart her on her hydrocortisone that she was on before.  Also called nephrology consult. 2. Chronic adrenal insufficiency. Patient does not recall this diagnosis and does not recall being on sterods. However I did find a [facility]. During that admission she had an abnormal cosyntropin test and was started on hydrocortisone. Not sure when this medication was dropped off her list. However we'll go ahead and restart this. 3. Hyponatremia. Mild. She has been drinking a lot of water.  She has a low BUN to creatinine ratio. I suspect mild water intoxication. hold off on any IV fluids now and recheck this in the morning. Also her adrenal insufficiency could contribute.   nephro consult. 4. Hypokalemia. We'll give her 1 dose of potassium. 5. Bipolar disorder. Continue her current medications next line 6. Involuntary movements   On Multiple psych meds , call psych and neuro consult.  All the records are reviewed and case discussed with Care Management/Social Workerr. Management plans discussed with the patient, family and they are in agreement.  CODE STATUS: Full.  TOTAL TIME TAKING CARE OF THIS PATIENT: 35 minutes.     POSSIBLE D/C IN 1-2 DAYS, DEPENDING ON CLINICAL CONDITION.   Altamese DillingVACHHANI, Verina Galeno M.D on 09/07/2016   Between 7am to 6pm - Pager - [phone]  After 6pm go to [email or web address] - password [email or web address] [facility]  [facility]  Office  [phone]  CC: Primary care physician; Galen ManilaKennedy, Lauren Renee, NP  Note: This dictation was prepared with Dragon dictation along with smaller phrase technology. Any transcriptional errors that result from this process are unintentional.

## 2016-09-07 NOTE — Progress Notes (Signed)
Pt requests enema due to constipation. Order placed per Dr. Winona LegatoVaickute.

## 2016-09-07 NOTE — Progress Notes (Signed)
Initial Nutrition Assessment  DOCUMENTATION CODES:   Not applicable  INTERVENTION:  Provide Magic cup TID with meals, each supplement provides 290 kcal and 9 grams of protein.  NUTRITION DIAGNOSIS:   Inadequate oral intake related to poor appetite as evidenced by per patient/family report.  GOAL:   Patient will meet greater than or equal to 90% of their needs  MONITOR:   PO intake, Supplement acceptance, Labs, Weight trends, I & O's  REASON FOR ASSESSMENT:   Malnutrition Screening Tool    ASSESSMENT:   66 year old female with PMHx of anxiety, bipolar 1 disorder, manic depressive disorder, HTN, prediabetes, depression, HLD, OA, hypothyroidism admitted with orthostatic hypotension.   Spoke with patient at bedside. She reports her appetite is improving since last night. She had her first meal last night that she has had in two weeks. She reports her appetite was decreased PTA due to frequent falls and dizziness. Patient reports she was falling 3-5 times per day. She also reports that she recently had many of her teeth pulled and she developed an infection and ulcers that decreased her appetite. Patient reports she does not need a chopped diet and she does not have any trouble chewing/swallowing. She reports she cannot tolerate Ensure, Boost, Boost Breeze, or any other ONS she has tried before. She is amenable to trying [employer].  Patient reports her UBW is 197 lbs. She reports she has lost 10 lbs in the past 2 weeks (does not align with weights in chart). Per chart patient was 221.1 lbs on 09/17/2015 and has lost 41.1 lbs (18.6% body weight) over the past year, which is not significant for time frame.  Medications reviewed and include: Oscal with D 1 tablet daily, MVI daily, senna, sertraline.  Labs reviewed: Sodium 132, Potassium 3.4, BUN <5.  Nutrition-Focused physical exam completed. Findings are no fat depletion, no muscle depletion, and mild edema.   Patient does not meet  criteria for malnutrition at this time, but is at risk for malnutrition.  Diet Order:  Diet regular Room service appropriate? Yes; Fluid consistency: Thin  Skin:  Reviewed, no issues  Last BM:  09/06/2016  Height:   Ht Readings from Last 1 Encounters:  09/06/16 5\' 7"  (1.702 m)    Weight:   Wt Readings from Last 1 Encounters:  09/06/16 180 lb (81.6 kg)    Ideal Body Weight:  61.4 kg  BMI:  Body mass index is 28.19 kg/m.  Estimated Nutritional Needs:   Kcal:  1680-1960 (MSJ x 1.2-1.4)  Protein:  82-98 grams (1-1.2 grams/kg)  Fluid:  1.7-2 L/day (1 ml/kcal)  EDUCATION NEEDS:   No education needs identified at this time  Helane RimaLeanne Ketzaly Cardella, MS, RD, LDN Pager: [phone] After Hours Pager: [phone]

## 2016-09-07 NOTE — Care Management Obs Status (Signed)
MEDICARE OBSERVATION STATUS NOTIFICATION   Patient Details  Name: Kelly Foster MRN: [id number] Date of Birth: 07/14/1950   Medicare Observation Status Notification Given:  Yes    Jhonny Calixto A, RN 09/07/2016, 2:01 PM

## 2016-09-08 DIAGNOSIS — Z87442 Personal history of urinary calculi: Secondary | ICD-10-CM | POA: Diagnosis not present

## 2016-09-08 DIAGNOSIS — R531 Weakness: Secondary | ICD-10-CM

## 2016-09-08 DIAGNOSIS — I10 Essential (primary) hypertension: Secondary | ICD-10-CM | POA: Diagnosis present

## 2016-09-08 DIAGNOSIS — Z888 Allergy status to other drugs, medicaments and biological substances status: Secondary | ICD-10-CM | POA: Diagnosis not present

## 2016-09-08 DIAGNOSIS — E039 Hypothyroidism, unspecified: Secondary | ICD-10-CM | POA: Diagnosis present

## 2016-09-08 DIAGNOSIS — Z8249 Family history of ischemic heart disease and other diseases of the circulatory system: Secondary | ICD-10-CM | POA: Diagnosis not present

## 2016-09-08 DIAGNOSIS — Z885 Allergy status to narcotic agent status: Secondary | ICD-10-CM | POA: Diagnosis not present

## 2016-09-08 DIAGNOSIS — G8929 Other chronic pain: Secondary | ICD-10-CM | POA: Diagnosis present

## 2016-09-08 DIAGNOSIS — R42 Dizziness and giddiness: Secondary | ICD-10-CM | POA: Diagnosis present

## 2016-09-08 DIAGNOSIS — R251 Tremor, unspecified: Secondary | ICD-10-CM

## 2016-09-08 DIAGNOSIS — F3160 Bipolar disorder, current episode mixed, unspecified: Secondary | ICD-10-CM

## 2016-09-08 DIAGNOSIS — E785 Hyperlipidemia, unspecified: Secondary | ICD-10-CM | POA: Diagnosis present

## 2016-09-08 DIAGNOSIS — Z87891 Personal history of nicotine dependence: Secondary | ICD-10-CM | POA: Diagnosis not present

## 2016-09-08 DIAGNOSIS — M545 Low back pain: Secondary | ICD-10-CM | POA: Diagnosis present

## 2016-09-08 DIAGNOSIS — Z79891 Long term (current) use of opiate analgesic: Secondary | ICD-10-CM | POA: Diagnosis not present

## 2016-09-08 DIAGNOSIS — E876 Hypokalemia: Secondary | ICD-10-CM | POA: Diagnosis not present

## 2016-09-08 DIAGNOSIS — Z7982 Long term (current) use of aspirin: Secondary | ICD-10-CM | POA: Diagnosis not present

## 2016-09-08 DIAGNOSIS — I951 Orthostatic hypotension: Secondary | ICD-10-CM | POA: Diagnosis present

## 2016-09-08 DIAGNOSIS — Z96642 Presence of left artificial hip joint: Secondary | ICD-10-CM | POA: Diagnosis present

## 2016-09-08 DIAGNOSIS — R296 Repeated falls: Secondary | ICD-10-CM | POA: Diagnosis present

## 2016-09-08 DIAGNOSIS — Z79899 Other long term (current) drug therapy: Secondary | ICD-10-CM | POA: Diagnosis not present

## 2016-09-08 DIAGNOSIS — E2740 Unspecified adrenocortical insufficiency: Secondary | ICD-10-CM | POA: Diagnosis present

## 2016-09-08 DIAGNOSIS — E871 Hypo-osmolality and hyponatremia: Secondary | ICD-10-CM | POA: Diagnosis present

## 2016-09-08 LAB — BASIC METABOLIC PANEL
Anion gap: 5 (ref 5–15)
CALCIUM: 8.2 mg/dL — AB (ref 8.9–10.3)
CO2: 27 mmol/L (ref 22–32)
CREATININE: 0.45 mg/dL (ref 0.44–1.00)
Chloride: 97 mmol/L — ABNORMAL LOW (ref 101–111)
GFR calc Af Amer: >60 mL/min (ref >60)
GLUCOSE: 98 mg/dL (ref 65–99)
POTASSIUM: 3.6 mmol/L (ref 3.5–5.1)
SODIUM: 129 mmol/L — AB (ref 135–145)

## 2016-09-08 LAB — URINE CULTURE

## 2016-09-08 MED ORDER — SODIUM CHLORIDE 0.9 % IV SOLN
INTRAVENOUS | Status: DC
  Administered 2016-09-08: 11:00:00 via INTRAVENOUS
  Administered 2016-09-09: 75 mL/h via INTRAVENOUS

## 2016-09-08 MED ORDER — POLYVINYL ALCOHOL 1.4 % OP SOLN
1.0000 [drp] | OPHTHALMIC | Status: DC | PRN
  Administered 2016-09-08: 1 [drp] via OPHTHALMIC
  Filled 2016-09-08 (×2): qty 15

## 2016-09-08 NOTE — Evaluation (Signed)
Physical Therapy Evaluation Patient Details Name: Kelly Foster MRN: [id number] DOB: January 01, 1951 Today's Date: 09/08/2016   History of Present Illness  Patient is a 66 y/o female admitted with multiple falls, believed to be related to orthostatic hypotension. She has a history of Bipolar disorder.   Clinical Impression  Patient was recently seen for falls assessment and returns after multiple falls at home. She has had positive orthostatic values in the past and again does so in this session, though she is asymptomatic. She demonstrates valgus bilaterally during gait and has trouble with sit to stand transfers secondary to hip musculature weakness, which could certainly be a contributing factor in her falling. She requires reminders for keeping her LEs within the RW, but has no buckling or loss of balance during ambulation. She would likely benefit from HHPT to address her weakness and perhaps assess living environment to minimize falls risk.     Follow Up Recommendations Home health PT    Equipment Recommendations  Rolling walker with 5" wheels    Recommendations for Other Services       Precautions / Restrictions Precautions Precautions: Fall Precaution Comments: +orthostatics during current admission Restrictions Weight Bearing Restrictions: No      Mobility  Bed Mobility Overal bed mobility: Needs Assistance Bed Mobility: Supine to Sit     Supine to sit: Min assist;Min guard     General bed mobility comments: Patient requires very minimal assistance to elevate her torso.   Transfers Overall transfer level: Needs assistance Equipment used: Rolling walker (2 wheeled) Transfers: Sit to/from Stand Sit to Stand: Min guard         General transfer comment: From bed initially noted to struggle to complete transfer, however from the chair she demonstrated no difficulty with completing transfer using handrails.   Ambulation/Gait Ambulation/Gait assistance: Min  guard Ambulation Distance (Feet): 200 Feet Assistive device: Rolling walker (2 wheeled) Gait Pattern/deviations: Step-through pattern;Decreased stride length;Trunk flexed     General Gait Details: Patient ambulates with wide BOS and has valgus bilaterally, unclear if anatomic or secondary to generalized LE weakness. She has RW off to her L side multiple times, especially with turning. No loss of balance or shortness of breath noted.   Stairs            Wheelchair Mobility    Modified Rankin (Stroke Patients Only)       Balance Overall balance assessment: Needs assistance;History of Falls Sitting-balance support: No upper extremity supported;Feet supported Sitting balance-Leahy Scale: Fair   Postural control: Right lateral lean Standing balance support: Bilateral upper extremity supported Standing balance-Leahy Scale: Fair                               Pertinent Vitals/Pain Pain Assessment: No/denies pain Faces Pain Scale: Hurts little more Pain Location: Patient reports her L knee bothers her with ambulation  Pain Descriptors / Indicators: Aching Pain Intervention(s): Limited activity within patient's tolerance;Monitored during session;Repositioned    Home Living Family/patient expects to be discharged to:: Private residence Living Arrangements: Children (Son) Available Help at Discharge: Family;Available 24 hours/day Type of Home: House Home Access: Stairs to enter Entrance Stairs-Rails: Doctor, general practice of Steps: 2 Home Layout: One level Home Equipment: Walker - 2 wheels;Walker - 4 wheels;Shower seat;Cane - quad;Bedside commode      Prior Function Level of Independence: Independent with assistive device(s)         Comments: Patient has been ambulating  with RW and has had multiple falls recently.      Hand Dominance   Dominant Hand: Right    Extremity/Trunk Assessment   Upper Extremity Assessment Upper Extremity  Assessment: Generalized weakness (LUE 4-4+/5 for grip and shoulder flexion)    Lower Extremity Assessment Lower Extremity Assessment: Generalized weakness    Cervical / Trunk Assessment Cervical / Trunk Assessment: Normal  Communication   Communication: No difficulties  Cognition                                              General Comments      Exercises     Assessment/Plan    PT Assessment Patient needs continued PT services  PT Problem List Decreased strength;Decreased balance;Decreased knowledge of use of DME;Decreased safety awareness;Pain       PT Treatment Interventions DME instruction;Gait training;Functional mobility training;Therapeutic activities;Stair training;Therapeutic exercise;Balance training;Neuromuscular re-education;Patient/family education    PT Goals (Current goals can be found in the Care Plan section)  Acute Rehab PT Goals Patient Stated Goal: To be more indpendent, and to figure out why she is falling. PT Goal Formulation: With patient Time For Goal Achievement: 09/22/16 Potential to Achieve Goals: Fair    Frequency Min 2X/week   Barriers to discharge        Co-evaluation               AM-PAC PT "6 Clicks" Daily Activity  Outcome Measure Difficulty turning over in bed (including adjusting bedclothes, sheets and blankets)?: A Little Difficulty moving from lying on back to sitting on the side of the bed? : A Little Difficulty sitting down on and standing up from a chair with arms (e.g., wheelchair, bedside commode, etc,.)?: A Little Help needed moving to and from a bed to chair (including a wheelchair)?: A Little Help needed walking in hospital room?: A Little Help needed climbing 3-5 steps with a railing? : A Little 6 Click Score: 18    End of Session Equipment Utilized During Treatment: Gait belt Activity Tolerance: Patient tolerated treatment well Patient left: in chair;with call bell/phone within reach;with  chair alarm set Nurse Communication: Mobility status;Other (comment) (BP Readings) PT Visit Diagnosis: Muscle weakness (generalized) (M62.81);History of falling (Z91.81)    Time: 1430-1450 PT Time Calculation (min) (ACUTE ONLY): 20 min   Charges:   PT Evaluation $PT Eval Moderate Complexity: 1 Mod     PT G Codes:   PT G-Codes **NOT FOR INPATIENT CLASS** Functional Assessment Tool Used: AM-PAC 6 Clicks Basic Mobility;Clinical judgement Functional Limitation: Mobility: Walking and moving around Mobility: Walking and Moving Around Current Status (Z6109(G8978): At least 20 percent but less than 40 percent impaired, limited or restricted Mobility: Walking and Moving Around Goal Status [id number]([id number]): At least 1 percent but less than 20 percent impaired, limited or restricted    Alva GarnetPatrick McNamara PT, DPT, CSCS    09/08/2016, 3:18 PM

## 2016-09-08 NOTE — Consult Note (Signed)
Reason for Consult: ? Tremors  Referring Physician: Dr. Sherryll BurgerShah  CC:  Possible tremors   HPI: Kelly Foster is an 66 y.o. female who was recently hospitalized with UTI, hyponatremia and orthostatic hypotension. She was treated for her UTI sodium was corrected however she left AMA. Since then she's had repeated falls and comes in today saying she gets dizzy lightheaded and then falls. Neurology consulted for tremors.    Past Medical History:  Diagnosis Date  . Adrenal insufficiency (HCC)   . Allergy   . Anxiety   . Arthritis   . Bipolar 1 disorder (HCC)   . Candidiasis of mouth and esophagus (HCC) 08/13/2016  . Central spinal stenosis   . Cervical radiculopathy, chronic   . Chronic adrenal insufficiency (HCC) 06/06/2013  . Chronic low back pain without sciatica   . Chronic pain syndrome   . Depression   . Disorder of left sacroiliac joint   . DJD (degenerative joint disease)   . Elevated CK   . Hemorrhoids 07/11/2016  . Hip pain   . History of kidney stones   . Hyperlipidemia   . Hypertension   . Hyponatremia   . Hypothyroidism   . Lower urinary tract infection 06/06/2013   Overview:  IMO Problem List Replacer Jan. 2016   . Lumbar radiculitis   . Manic depressive disorder (HCC)   . Neuritis or radiculitis due to rupture of lumbar intervertebral disc 05/29/2014  . OA (osteoarthritis) of hip   . Overweight   . Prediabetes   . Reflux esophagitis   . Sacrococcygeal disorders, not elsewhere classified   . Sacroiliac joint dysfunction 04/05/2015  . Trochanteric bursitis of left hip   . Ulnar neuropathy   . Urinary frequency   . Vaginal atrophy     Past Surgical History:  Procedure Laterality Date  . ABDOMINAL HYSTERECTOMY    . arm surgery Left   . CESAREAN SECTION    . JOINT REPLACEMENT Left 2016   dec  . 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 Alzheimer's disease Mother   . Arthritis Mother   . Stroke Father   . Hypertension Father   . Colon cancer Sister   . Hypertension Sister   . Kidney disease Neg Hx     Social History:  reports that she quit smoking about 21 years ago. Her smoking use included Cigarettes. She smoked 1.00 pack per day. She has never used smokeless tobacco. She reports that she does not drink alcohol or use drugs.  Allergies  Allergen Reactions  . Gabapentin Other (See Comments)    tremors tremors  . Codeine Itching  . Pregabalin Other (See Comments)    Left side pain under arm, started after use of lyrica  . Tramadol Itching    Medications: I have reviewed the patient's current medications.  ROS: Unable to obtain due to confusion   Physical Examination: Blood pressure (!) 91/53, pulse (!) 110, temperature 99 F (37.2 C), temperature source Oral, resp. rate 18, height 5\' 7"  (1.702 m), weight 81.6 kg (180 lb), SpO2 95 %.    Neurological Examination   Mental Status: Alert, oriented, to name and place  Cranial Nerves: II: Discs flat bilaterally; Visual fields grossly normal, pupils equal, round, reactive to light and accommodation III,IV, VI: ptosis not  present, extra-ocular motions intact bilaterally V,VII: smile symmetric, facial light touch sensation normal bilaterally VIII: hearing normal bilaterally IX,X: gag reflex present XI: bilateral shoulder shrug XII: midline tongue extension Motor: Right : Upper extremity   4+/5    Left:     Upper extremity   4+/5  Lower extremity   4+/5     Lower extremity   4+/5 Tone and bulk:normal tone throughout; no atrophy noted Sensory: Pinprick and light touch intact throughout, bilaterally Deep Tendon Reflexes: 1+ and symmetric throughout Plantars: Right: downgoing   Left: downgoing Cerebellar: Not tested Gait: not tested      Laboratory Studies:   Basic Metabolic  Panel:  Recent Labs Lab 09/06/16 1540 09/07/16 0343 09/08/16 0308  NA 127* 132* 129*  K 3.3* 3.4* 3.6  CL 93* 101 97*  CO2 25 26 27   GLUCOSE 78 91 98  BUN <5* <5* <5*  CREATININE 0.63 0.49 0.45  CALCIUM 8.7* 8.0* 8.2*    Liver Function Tests:  Recent Labs Lab 09/06/16 1540  AST 49*  ALT 26  ALKPHOS 127*  BILITOT 0.4  PROT 6.5  ALBUMIN 2.5*   No results for input(s): LIPASE, AMYLASE in the last 168 hours. No results for input(s): AMMONIA in the last 168 hours.  CBC:  Recent Labs Lab 09/06/16 1540  WBC 3.7  NEUTROABS 2.1  HGB 11.5*  HCT 33.9*  MCV 89.2  PLT 434    Cardiac Enzymes:  Recent Labs Lab 09/06/16 1540  TROPONINI <0.03    BNP: Invalid input(s): POCBNP  CBG: No results for input(s): GLUCAP in the last 168 hours.  Microbiology: Results for orders placed or performed during the hospital encounter of 09/06/16  Blood Culture (routine x 2)     Status: None (Preliminary result)   Collection Time: 09/06/16  3:40 PM  Result Value Ref Range Status   Specimen Description BLOOD LEFT ANTECUBITAL  Final   Special Requests   Final    BOTTLES DRAWN AEROBIC AND ANAEROBIC Blood Culture adequate volume   Culture NO GROWTH 2 DAYS  Final   Report Status PENDING  Incomplete  Blood Culture (routine x 2)     Status: None (Preliminary result)   Collection Time: 09/06/16  3:40 PM  Result Value Ref Range Status   Specimen Description BLOOD RIGHT ANTECUBITAL  Final   Special Requests   Final    BOTTLES DRAWN AEROBIC AND ANAEROBIC Blood Culture adequate volume   Culture NO GROWTH 2 DAYS  Final   Report Status PENDING  Incomplete  Urine culture     Status: Abnormal   Collection Time: 09/06/16  3:40 PM  Result Value Ref Range Status   Specimen Description URINE, RANDOM  Final   Special Requests NONE  Final   Culture 20,000 COLONIES/mL ENTEROBACTER SPECIES (A)  Final   Report Status 09/08/2016 FINAL  Final   Organism ID, Bacteria ENTEROBACTER SPECIES (A)   Final      Susceptibility   Enterobacter species - MIC*    CEFAZOLIN >=64 RESISTANT Resistant     CEFTRIAXONE 32 INTERMEDIATE Intermediate     CIPROFLOXACIN <=0.25 SENSITIVE Sensitive     GENTAMICIN <=1 SENSITIVE Sensitive     IMIPENEM 1 SENSITIVE Sensitive     NITROFURANTOIN 64 INTERMEDIATE Intermediate     TRIMETH/SULFA <=20 SENSITIVE Sensitive     PIP/TAZO >=128 RESISTANT Resistant     * 20,000 COLONIES/mL ENTEROBACTER SPECIES    Coagulation Studies: No results for input(s): LABPROT, INR in the last  72 hours.  Urinalysis:  Recent Labs Lab 09/06/16 1540  COLORURINE STRAW*  LABSPEC 1.004*  PHURINE 7.0  GLUCOSEU NEGATIVE  HGBUR NEGATIVE  BILIRUBINUR NEGATIVE  KETONESUR 5*  PROTEINUR NEGATIVE  NITRITE NEGATIVE  LEUKOCYTESUR NEGATIVE    Lipid Panel:     Component Value Date/Time   CHOL 152 02/23/2014 1620   TRIG 169 02/23/2014 1620   HDL 42 02/23/2014 1620   VLDL 34 02/23/2014 1620   LDLCALC 76 02/23/2014 1620    HgbA1C:  Lab Results  Component Value Date   HGBA1C 5.6 08/28/2016    Urine Drug Screen:  No results found for: LABOPIA, COCAINSCRNUR, LABBENZ, AMPHETMU, THCU, LABBARB  Alcohol Level: No results for input(s): ETH in the last 168 hours.   Imaging: Ct Head Wo Contrast  Result Date: 09/06/2016 CLINICAL DATA:  Increased weakness and increased falling in past week. EXAM: CT HEAD WITHOUT CONTRAST TECHNIQUE: Contiguous axial images were obtained from the base of the skull through the vertex without intravenous contrast. COMPARISON:  08/28/2016 FINDINGS: Brain: No evidence for acute hemorrhage, mass lesion, midline shift, hydrocephalus or large infarct. Vascular: No hyperdense vessel or unexpected calcification. Skull: Normal. Negative for fracture or focal lesion. Sinuses/Orbits: No acute finding. Other: Soft tissue swelling along the right posterior upper scalp. IMPRESSION: No acute intracranial abnormality. Swelling along the posterior upper right scalp. No  underlying fracture. Electronically Signed   By: Richarda Overlie M.D.   On: 09/06/2016 16:41   Dg Chest Port 1 View  Result Date: 09/06/2016 CLINICAL DATA:  66 year old female with fever, increased weakness and recent falls. EXAM: PORTABLE CHEST 1 VIEW COMPARISON:  08/28/2016. FINDINGS: Portable AP upright view at 1543 hours. Mildly lower lung volumes. Allowing for portable technique the lungs remain clear. No pneumothorax or pleural effusion. Mediastinal contours remain within normal limits. Visualized tracheal air column is within normal limits. Negative visible bowel gas pattern. Chronic left posterior rib fractures again noted. No acute osseous abnormality identified. IMPRESSION: Lower lung volumes.  No acute cardiopulmonary abnormality. Electronically Signed   By: Odessa Fleming M.D.   On: 09/06/2016 16:05     Assessment/Plan:  66 y.o. female who was recently hospitalized with UTI, hyponatremia and orthostatic hypotension. She was treated for her UTI sodium was corrected however she left AMA. Since then she's had repeated falls and comes in today saying she gets dizzy lightheaded and then falls. Neurology consulted for tremors.    When questioned about tremors pt could not describe them and I didn't clearly witness any There is possibility of essential tremor Will not change of adjust any of her psych meds  She does not need any further imaging from neuro stand point 09/08/2016, 11:04 AM

## 2016-09-08 NOTE — Consult Note (Signed)
[facility] CONSULT NOTE    Date: 09/08/2016                  Patient Name:  Kelly Foster  MRN: [id number]  DOB: October 31, 1950  Age / Sex: 66 y.o., female         PCP: Galen Manila, NP                 Service Requesting Consult: Hospitalist                 Reason for Consult: Hyponatremia, prior history of adrenal insufficiency            History of Present Illness: Patient is a 66 y.o. female with a PMHx of Mild adrenal insufficiency, anxiety, bipolar disorder, cervical radiculopathy, chronic low back pain, depression, hyperlipidemia, hypertension, prior history of hyponatremia, hypothyroidism, osteoarthritis who was admitted to [facility] on 09/06/2016 for evaluation of recurrent falls. The patient was recently admitted [facility] for urinary tract infection, hyponatremia, and severe orthostatic hypotension. She was treated for the UTI previously but she left AGAINST MEDICAL ADVICE.  Upon presentation her serum sodium was 127. Serum sodium did rise to 132. However it is now down to 129. Patient has prior history of adrenal insufficiency. This was noted at [facility] in 2015. At that time she apparently had a low serum is all in the a.m. and also had an abnormal cosyntropin stimulation test.  Last week her p.m. cortisol was 7.7. This week random cortisol level was higher at 15.     Medications: Outpatient medications: Prescriptions Prior to Admission  Medication Sig Dispense Refill Last Dose  . albuterol (PROVENTIL HFA;VENTOLIN HFA) 108 (90 Base) MCG/ACT inhaler Inhale 2 puffs into the lungs every 6 (six) hours as needed for wheezing or shortness of breath. 1 Inhaler 3 prn at prn  . amitriptyline (ELAVIL) 50 MG tablet Take 50 mg by mouth at bedtime.   09/05/2016 at 1900  . ARTIFICIAL TEARS 0.1-0.3 % SOLN Administer 1 drop to both eyes 4 (four) times a day as needed.   prn at prn  . aspirin 81 MG tablet Take 1 tablet (81 mg total) by mouth  daily. 30 tablet 0 09/06/2016 at 0700  . bisacodyl (DULCOLAX) 5 MG EC tablet Take 5 mg by mouth at bedtime as needed for moderate constipation. Patient is taking 2 tabs.   prn at prn  . Calcium Carbonate-Vitamin D3 (CALCIUM 600-D) 600-400 MG-UNIT TABS Take 1 tablet by mouth daily.   09/06/2016 at 0700  . divalproex (DEPAKOTE) 500 MG DR tablet Take 500 mg by mouth 2 (two) times daily.   09/06/2016 at 0700  . HYDROcodone-acetaminophen (NORCO/VICODIN) 5-325 MG tablet Take 1 tablet by mouth 2 (two) times daily.   09/06/2016 at 0700  . hydrocortisone (PROCTOSOL HC) 2.5 % rectal cream    prn at prn  . montelukast (SINGULAIR) 10 MG tablet Take 1 tablet (10 mg total) by mouth at bedtime. Reported on 04/05/2015 60 tablet 0 09/06/2016 at 0700  . Multiple Vitamin (MULTIVITAMIN) tablet Take 1 tablet by mouth daily.   09/06/2016 at 0700  . Olopatadine HCl 0.2 % SOLN Place 1 drop into both eyes daily. 3 Bottle 1 09/06/2016 at 0700  . ondansetron (ZOFRAN) 4 MG tablet Take 1 tablet by mouth every 8 (eight) hours as needed.  0 prn at prn  . oxybutynin (DITROPAN XL) 15 MG 24 hr tablet Take one in the am and  one in the pm 180 tablet 4 09/06/2016 at 0700  . polyethylene glycol powder (GLYCOLAX/MIRALAX) powder 1 cap full in a full glass of water, two times a day for 3 days if you have not had a bowel movement in 3 days. 255 g 0 prn at prn  . RA LAXATIVE & STOOL SOFTENER 8.6-50 MG tablet Take 2 tablets by mouth 2 (two) times daily.  0 09/06/2016 at 0700  . sertraline (ZOLOFT) 100 MG tablet Take 200 mg by mouth daily.    09/06/2016 at 0700  . simvastatin (ZOCOR) 40 MG tablet Take 40 mg by mouth at bedtime.    09/05/2016 at 1900  . acyclovir (ZOVIRAX) 400 MG tablet Take 400 mg by mouth 3 (three) times daily as needed (for flares).   prn at prn  . Spacer/Aero Chamber Mouthpiece MISC by Does not apply route.   Taking    Current medications: Current Facility-Administered Medications  Medication Dose Route Frequency Provider Last Rate Last Dose   . 0.9 %  sodium chloride infusion  250 mL Intravenous PRN Gracelyn Nurse, MD      . acetaminophen (TYLENOL) tablet 650 mg  650 mg Oral Q6H PRN Katharina Caper, MD      . albuterol (PROVENTIL) (2.5 MG/3ML) 0.083% nebulizer solution 3 mL  3 mL Inhalation Q6H PRN Gracelyn Nurse, MD      . amitriptyline (ELAVIL) tablet 50 mg  50 mg Oral QHS Gracelyn Nurse, MD   50 mg at 09/07/16 2121  . aspirin EC tablet 81 mg  81 mg Oral Daily Gracelyn Nurse, MD   81 mg at 09/07/16 1110  . bisacodyl (DULCOLAX) EC tablet 5 mg  5 mg Oral QHS PRN Gracelyn Nurse, MD   5 mg at 09/07/16 1111  . calcium-vitamin D (OSCAL WITH D) 500-200 MG-UNIT per tablet 1 tablet  1 tablet Oral Daily Gracelyn Nurse, MD   1 tablet at 09/07/16 1109  . chlorhexidine (PERIDEX) 0.12 % solution 15 mL  15 mL Mouth Rinse BID Altamese Dilling, MD   15 mL at 09/07/16 2122  . divalproex (DEPAKOTE) DR tablet 500 mg  500 mg Oral BID Gracelyn Nurse, MD   500 mg at 09/07/16 2121  . enoxaparin (LOVENOX) injection 40 mg  40 mg Subcutaneous Q24H Gracelyn Nurse, MD   40 mg at 09/07/16 2121  . HYDROcodone-acetaminophen (NORCO/VICODIN) 5-325 MG per tablet 1 tablet  1 tablet Oral Q6H PRN Katharina Caper, MD   1 tablet at 09/07/16 1109  . hydrocortisone (CORTEF) tablet 5 mg  5 mg Oral Daily Gracelyn Nurse, MD   5 mg at 09/07/16 1118  . MEDLINE mouth rinse  15 mL Mouth Rinse q12n4p Altamese Dilling, MD      . montelukast (SINGULAIR) tablet 10 mg  10 mg Oral QHS Gracelyn Nurse, MD   10 mg at 09/07/16 2121  . multivitamin with minerals tablet 1 tablet  1 tablet Oral Daily Gracelyn Nurse, MD   1 tablet at 09/07/16 1109  . oxybutynin (DITROPAN-XL) 24 hr tablet 5 mg  5 mg Oral QHS Gracelyn Nurse, MD   5 mg at 09/07/16 2121  . senna-docusate (Senokot-S) tablet 2 tablet  2 tablet Oral BID Gracelyn Nurse, MD   2 tablet at 09/07/16 2121  . sertraline (ZOLOFT) tablet 200 mg  200 mg Oral Daily Gracelyn Nurse, MD   200 mg at 09/07/16 1117   . simvastatin (ZOCOR) tablet  40 mg  40 mg Oral QHS Gracelyn Nurse, MD   40 mg at 09/07/16 2121  . sodium chloride flush (NS) 0.9 % injection 3 mL  3 mL Intravenous Q12H Gracelyn Nurse, MD   3 mL at 09/07/16 2122  . sodium chloride flush (NS) 0.9 % injection 3 mL  3 mL Intravenous PRN Gracelyn Nurse, MD          Allergies: Allergies  Allergen Reactions  . Gabapentin Other (See Comments)    tremors tremors  . Codeine Itching  . Pregabalin Other (See Comments)    Left side pain under arm, started after use of lyrica  . Tramadol Itching      Past Medical History: Past Medical History:  Diagnosis Date  . Adrenal insufficiency (HCC)   . Allergy   . Anxiety   . Arthritis   . Bipolar 1 disorder (HCC)   . Candidiasis of mouth and esophagus (HCC) 08/13/2016  . Central spinal stenosis   . Cervical radiculopathy, chronic   . Chronic adrenal insufficiency (HCC) 06/06/2013  . Chronic low back pain without sciatica   . Chronic pain syndrome   . Depression   . Disorder of left sacroiliac joint   . DJD (degenerative joint disease)   . Elevated CK   . Hemorrhoids 07/11/2016  . Hip pain   . History of kidney stones   . Hyperlipidemia   . Hypertension   . Hyponatremia   . Hypothyroidism   . Lower urinary tract infection 06/06/2013   Overview:  IMO Problem List Replacer Jan. 2016   . Lumbar radiculitis   . Manic depressive disorder (HCC)   . Neuritis or radiculitis due to rupture of lumbar intervertebral disc 05/29/2014  . OA (osteoarthritis) of hip   . Overweight   . Prediabetes   . Reflux esophagitis   . Sacrococcygeal disorders, not elsewhere classified   . Sacroiliac joint dysfunction 04/05/2015  . Trochanteric bursitis of left hip   . Ulnar neuropathy   . Urinary frequency   . Vaginal atrophy      Past Surgical History: Past Surgical History:  Procedure Laterality Date  . ABDOMINAL HYSTERECTOMY    . arm surgery Left   . CESAREAN SECTION    . JOINT REPLACEMENT Left  2016   dec  . TOTAL HIP ARTHROPLASTY Left 12/26/2014   Procedure: TOTAL HIP ARTHROPLASTY ANTERIOR APPROACH;  Surgeon: Kennedy Bucker, MD;  Location: [facility] ORS;  Service: Orthopedics;  Laterality: Left;     Family History: Family History  Problem Relation Age of Onset  . Alzheimer's disease Mother   . Arthritis Mother   . Stroke Father   . Hypertension Father   . Colon cancer Sister   . Hypertension Sister   . Kidney disease Neg Hx      Social History: Social History   Social History  . Marital status: Single    Spouse name: N/A  . Number of children: N/A  . Years of education: N/A   Occupational History  . disabled    Social History Main Topics  . Smoking status: Former Smoker    Packs/day: 1.00    Types: Cigarettes    Quit date: 03/01/1995  . Smokeless tobacco: Never Used     Comment: quit 1998  . Alcohol use No  . Drug use: No     Comment: Former hx of Crack Cocaine quit 1998  . Sexual activity: Not on file   Other Topics Concern  .  Not on file   Social History Narrative  . No narrative on file     Review of Systems: Review of Systems  Constitutional: Positive for malaise/fatigue. Negative for chills, fever and weight loss.  HENT: Negative for congestion, hearing loss and tinnitus.   Eyes: Negative for blurred vision and double vision.  Respiratory: Negative for cough, hemoptysis and sputum production.   Cardiovascular: Negative for chest pain, palpitations and orthopnea.  Gastrointestinal: Positive for constipation and nausea. Negative for heartburn and vomiting.  Genitourinary: Negative for dysuria, frequency and urgency.  Musculoskeletal: Positive for back pain.  Skin: Negative for itching and rash.  Neurological: Positive for dizziness and weakness.  Endo/Heme/Allergies: Negative for polydipsia. Does not bruise/bleed easily.  Psychiatric/Behavioral: Negative for memory loss. The patient does not have insomnia.      Vital Signs: Blood pressure (!)  91/53, pulse (!) 110, temperature 99 F (37.2 C), temperature source Oral, resp. rate 18, height 5\' 7"  (1.702 m), weight 81.6 kg (180 lb), SpO2 95 %.  Weight trends: Filed Weights   09/06/16 1532  Weight: 81.6 kg (180 lb)    Physical Exam: General: NAD, sitting up in bed  Head: Normocephalic, atraumatic.  Eyes: Anicteric, EOMI  Nose: Mucous membranes moist, not inflammed, nonerythematous.  Throat: Oropharynx nonerythematous, no exudate appreciated.   Neck: Supple, trachea midline.  Lungs:  Normal respiratory effort. Clear to auscultation BL without crackles or wheezes.  Heart: RRR. S1 and S2 normal without gallop, murmur, or rubs.  Abdomen:  BS normoactive. Soft, Nondistended, non-tender.  No masses or organomegaly.  Extremities: trace pretibial edema.  Neurologic: A&O X3, Motor strength is 5/5 in the all 4 extremities  Skin: No visible rashes, scars.    Lab results: Basic Metabolic Panel:  Recent Labs Lab 09/06/16 1540 09/07/16 0343 09/08/16 0308  NA 127* 132* 129*  K 3.3* 3.4* 3.6  CL 93* 101 97*  CO2 25 26 27   GLUCOSE 78 91 98  BUN <5* <5* <5*  CREATININE 0.63 0.49 0.45  CALCIUM 8.7* 8.0* 8.2*    Liver Function Tests:  Recent Labs Lab 09/06/16 1540  AST 49*  ALT 26  ALKPHOS 127*  BILITOT 0.4  PROT 6.5  ALBUMIN 2.5*   No results for input(s): LIPASE, AMYLASE in the last 168 hours. No results for input(s): AMMONIA in the last 168 hours.  CBC:  Recent Labs Lab 09/06/16 1540  WBC 3.7  NEUTROABS 2.1  HGB 11.5*  HCT 33.9*  MCV 89.2  PLT 434    Cardiac Enzymes:  Recent Labs Lab 09/06/16 1540  TROPONINI <0.03    BNP: Invalid input(s): POCBNP  CBG: No results for input(s): GLUCAP in the last 168 hours.  Microbiology: Results for orders placed or performed during the hospital encounter of 09/06/16  Blood Culture (routine x 2)     Status: None (Preliminary result)   Collection Time: 09/06/16  3:40 PM  Result Value Ref Range Status    Specimen Description BLOOD LEFT ANTECUBITAL  Final   Special Requests   Final    BOTTLES DRAWN AEROBIC AND ANAEROBIC Blood Culture adequate volume   Culture NO GROWTH 2 DAYS  Final   Report Status PENDING  Incomplete  Blood Culture (routine x 2)     Status: None (Preliminary result)   Collection Time: 09/06/16  3:40 PM  Result Value Ref Range Status   Specimen Description BLOOD RIGHT ANTECUBITAL  Final   Special Requests   Final    BOTTLES DRAWN AEROBIC AND  ANAEROBIC Blood Culture adequate volume   Culture NO GROWTH 2 DAYS  Final   Report Status PENDING  Incomplete  Urine culture     Status: Abnormal   Collection Time: 09/06/16  3:40 PM  Result Value Ref Range Status   Specimen Description URINE, RANDOM  Final   Special Requests NONE  Final   Culture 20,000 COLONIES/mL ENTEROBACTER SPECIES (A)  Final   Report Status 09/08/2016 FINAL  Final   Organism ID, Bacteria ENTEROBACTER SPECIES (A)  Final      Susceptibility   Enterobacter species - MIC*    CEFAZOLIN >=64 RESISTANT Resistant     CEFTRIAXONE 32 INTERMEDIATE Intermediate     CIPROFLOXACIN <=0.25 SENSITIVE Sensitive     GENTAMICIN <=1 SENSITIVE Sensitive     IMIPENEM 1 SENSITIVE Sensitive     NITROFURANTOIN 64 INTERMEDIATE Intermediate     TRIMETH/SULFA <=20 SENSITIVE Sensitive     PIP/TAZO >=128 RESISTANT Resistant     * 20,000 COLONIES/mL ENTEROBACTER SPECIES    Coagulation Studies: No results for input(s): LABPROT, INR in the last 72 hours.  Urinalysis:  Recent Labs  09/06/16 1540  COLORURINE STRAW*  LABSPEC 1.004*  PHURINE 7.0  GLUCOSEU NEGATIVE  HGBUR NEGATIVE  BILIRUBINUR NEGATIVE  KETONESUR 5*  PROTEINUR NEGATIVE  NITRITE NEGATIVE  LEUKOCYTESUR NEGATIVE      Imaging: Ct Head Wo Contrast  Result Date: 09/06/2016 CLINICAL DATA:  Increased weakness and increased falling in past week. EXAM: CT HEAD WITHOUT CONTRAST TECHNIQUE: Contiguous axial images were obtained from the base of the skull through the  vertex without intravenous contrast. COMPARISON:  08/28/2016 FINDINGS: Brain: No evidence for acute hemorrhage, mass lesion, midline shift, hydrocephalus or large infarct. Vascular: No hyperdense vessel or unexpected calcification. Skull: Normal. Negative for fracture or focal lesion. Sinuses/Orbits: No acute finding. Other: Soft tissue swelling along the right posterior upper scalp. IMPRESSION: No acute intracranial abnormality. Swelling along the posterior upper right scalp. No underlying fracture. Electronically Signed   By: Richarda Overlie M.D.   On: 09/06/2016 16:41   Dg Chest Port 1 View  Result Date: 09/06/2016 CLINICAL DATA:  66 year old female with fever, increased weakness and recent falls. EXAM: PORTABLE CHEST 1 VIEW COMPARISON:  08/28/2016. FINDINGS: Portable AP upright view at 1543 hours. Mildly lower lung volumes. Allowing for portable technique the lungs remain clear. No pneumothorax or pleural effusion. Mediastinal contours remain within normal limits. Visualized tracheal air column is within normal limits. Negative visible bowel gas pattern. Chronic left posterior rib fractures again noted. No acute osseous abnormality identified. IMPRESSION: Lower lung volumes.  No acute cardiopulmonary abnormality. Electronically Signed   By: Odessa Fleming M.D.   On: 09/06/2016 16:05      Assessment & Plan: Pt is a 66 y.o. female with a PMHx of Mild adrenal insufficiency, anxiety, bipolar disorder, cervical radiculopathy, chronic low back pain, depression, hyperlipidemia, hypertension, prior history of hyponatremia, hypothyroidism, osteoarthritis who was admitted to [facility] on 09/06/2016 for evaluation of recurrent falls.   1.  Hyponatremia. 2.  Hypokalemia. 3.  Prior history of adrenal insufficiency 4.  Hypotension.   Plan: The patient comes back in with recurrent hyponatremia and orthostatic hypotension. In review of records at [facility] previously she was found to have mild adrenal insufficiency. Unclear  as to whether the patient followed up for this issue. Given orthostatic hypotension and hyponatremia this is certainly a possibility now. Last week p.m. cortisol was 7.7 which is a bit low for the afternoon. This value was  repeated and came back at 15 on this admission. Patient has been given 1 dose of hydrocortisone however. We will restart the patient on IV fluid hydration with 0.9 normal saline as sodium has dropped again. We will also obtain endocrinology consultation. Further plan based upon patient response to therapy.

## 2016-09-08 NOTE — Progress Notes (Signed)
[facility] [place] [facility]   PATIENT NAME: Kelly Foster    MR#:  [id number]  DATE OF BIRTH:  07-22-1950  SUBJECTIVE:  CHIEF COMPLAINT:   Chief Complaint  Patient presents with  . Weakness  Patient requesting pain medication for her tooth pulled back in March, blood pressure much better REVIEW OF SYSTEMS:  CONSTITUTIONAL: No fever, positive for fatigue or weakness.  EYES: No blurred or double vision.  EARS, NOSE, AND THROAT: No tinnitus or ear pain.  RESPIRATORY: No cough, shortness of breath, wheezing or hemoptysis.  CARDIOVASCULAR: No chest pain, orthopnea, edema.  GASTROINTESTINAL: No nausea, vomiting, diarrhea or abdominal pain.  GENITOURINARY: No dysuria, hematuria.  ENDOCRINE: No polyuria, nocturia,  HEMATOLOGY: No anemia, easy bruising or bleeding SKIN: No rash or lesion. MUSCULOSKELETAL: No joint pain or arthritis.   NEUROLOGIC: No tingling, numbness, weakness.  PSYCHIATRY: No anxiety or depression.   ROS  DRUG ALLERGIES:   Allergies  Allergen Reactions  . Gabapentin Other (See Comments)    tremors tremors  . Codeine Itching  . Pregabalin Other (See Comments)    Left side pain under arm, started after use of lyrica  . Tramadol Itching    VITALS:  Blood pressure 139/72, pulse 94, temperature 99 F (37.2 C), temperature source Oral, resp. rate 18, height 5\' 7"  (1.702 m), weight 81.6 kg (180 lb), SpO2 94 %.  PHYSICAL EXAMINATION:  GENERAL:  66 y.o.-year-old patient lying in the bed with no acute distress.  EYES: Pupils equal, round, reactive to light and accommodation. No scleral icterus. Extraocular muscles intact.  HEENT: Head atraumatic, normocephalic. Oropharynx and nasopharynx clear. No teeth. NECK:  Supple, no jugular venous distention. No thyroid enlargement, no tenderness.  LUNGS: Normal breath sounds bilaterally, no wheezing, rales,rhonchi or crepitation. No use of accessory muscles of respiration.  CARDIOVASCULAR: S1, S2  normal. No murmurs, rubs, or gallops.  ABDOMEN: Soft, nontender, nondistended. Bowel sounds present. No organomegaly or mass.  EXTREMITIES: No pedal edema, cyanosis, or clubbing.  NEUROLOGIC: Cranial nerves II through XII are intact. Muscle strength 4/5 in all extremities. Sensation intact. Gait not checked.  PSYCHIATRIC: The patient is alert and oriented x 3.  SKIN: No obvious rash, lesion, or ulcer.   Physical Exam LABORATORY PANEL:   CBC  Recent Labs Lab 09/06/16 1540  WBC 3.7  HGB 11.5*  HCT 33.9*  PLT 434   ------------------------------------------------------------------------------------------------------------------  Chemistries   Recent Labs Lab 09/06/16 1540  09/08/16 0308  NA 127*  < > 129*  K 3.3*  < > 3.6  CL 93*  < > 97*  CO2 25  < > 27  GLUCOSE 78  < > 98  BUN <5*  < > <5*  CREATININE 0.63  < > 0.45  CALCIUM 8.7*  < > 8.2*  AST 49*  --   --   ALT 26  --   --   ALKPHOS 127*  --   --   BILITOT 0.4  --   --   < > = values in this interval not displayed. ------------------------------------------------------------------------------------------------------------------  Cardiac Enzymes  Recent Labs Lab 09/06/16 1540  TROPONINI <0.03   ------------------------------------------------------------------------------------------------------------------  RADIOLOGY:  Ct Head Wo Contrast  Result Date: 09/06/2016 CLINICAL DATA:  Increased weakness and increased falling in past week. EXAM: CT HEAD WITHOUT CONTRAST TECHNIQUE: Contiguous axial images were obtained from the base of the skull through the vertex without intravenous contrast. COMPARISON:  08/28/2016 FINDINGS: Brain: No evidence for acute hemorrhage, mass lesion, midline  shift, hydrocephalus or large infarct. Vascular: No hyperdense vessel or unexpected calcification. Skull: Normal. Negative for fracture or focal lesion. Sinuses/Orbits: No acute finding. Other: Soft tissue swelling along the right  posterior upper scalp. IMPRESSION: No acute intracranial abnormality. Swelling along the posterior upper right scalp. No underlying fracture. Electronically Signed   By: Richarda OverlieAdam  Henn M.D.   On: 09/06/2016 16:41   Dg Chest Port 1 View  Result Date: 09/06/2016 CLINICAL DATA:  66 year old female with fever, increased weakness and recent falls. EXAM: PORTABLE CHEST 1 VIEW COMPARISON:  08/28/2016. FINDINGS: Portable AP upright view at 1543 hours. Mildly lower lung volumes. Allowing for portable technique the lungs remain clear. No pneumothorax or pleural effusion. Mediastinal contours remain within normal limits. Visualized tracheal air column is within normal limits. Negative visible bowel gas pattern. Chronic left posterior rib fractures again noted. No acute osseous abnormality identified. IMPRESSION: Lower lung volumes.  No acute cardiopulmonary abnormality. Electronically Signed   By: Odessa FlemingH  Hall M.D.   On: 09/06/2016 16:05    ASSESSMENT AND PLAN:   Active Problems:   Orthostatic hypotension   Hyponatremia  1. Orthostatic hypotension.  -Responding well to hydrocortisone that she was on before for chronic adrenal insufficiency. - We will recheck orthostatic vitals today  2. Chronic adrenal insufficiency - Responding to hydrocortisone 5 mg once daily  3. Hyponatremia. Mild - Sodium 129 today - Pending nephro consult.  4. Hypokalemia: Replete and recheck  5. Bipolar disorder. Continue her current medications  6. Involuntary movements   On Multiple psych meds, my associate has requested psych and neuro consult.  Discontinue telemetry - PT/OT consultation.  Considering recurrent fall   All the records are reviewed and case discussed with Care Management/Social Workerr. Management plans discussed with the patient, nursing and they are in agreement.  CODE STATUS: Full.  TOTAL TIME TAKING CARE OF THIS PATIENT: 25 minutes.     POSSIBLE D/C IN 1 DAYS, DEPENDING ON CLINICAL  CONDITION.   Delfino LovettVipul Sadey Yandell M.D on 09/08/2016   Between 7am to 6pm - Pager - [phone]  After 6pm go to [email or web address] - password [email or web address] [facility]  [facility] [place] [facility]  Office  [phone]  CC: Primary care physician; Galen ManilaKennedy, Lauren Renee, NP  Note: This dictation was prepared with Dragon dictation along with smaller phrase technology. Any transcriptional errors that result from this process are unintentional.

## 2016-09-08 NOTE — Evaluation (Signed)
Occupational Therapy Evaluation Patient Details Name: Kelly Foster MRN: [id number] DOB: 09/25/1950 Today's Date: 09/08/2016    History of Present Illness Pt.  is a 66 y.o. female who was [facility] with multiple falls, low BP, and UTI.  Pt. PMHx includes: Adrenal Insufficiency, Bipolar 1 Disorder, cervical spinal stenosis, chronic Low back Pain, Candidiasis, Sciatica, DJD, Anxiety, Anxiety MAnic Depressive Disorder, Trochnateric Bursitis of the Left Hip, Sacrococcygeal Disorder, Sacroiliac Joint Dysfunction.    Clinical Impression   Pt. is a 66 y.o. female who was admitted to [facility] with multiple falls, orthostatic hypotension, and UTI. Pt. resides at home with her son, and reports being independent with ADLs, and IADL tasks including meal per, and medication management.  Pt. reports having 4-5 falls recently. Pt. BP at rest is 118/44. Pt. presents with weakness, 10/10 pain all over, history of multiple falls, and limited mobility which impact her ability to complete ADL and IADL tasks. Pt. Could benefit from OT services to improve ADL functioning, A/E training, functional mobility for ADLs, and pt. education in home modification, and DME in order to return to her PLOF safely. Pt. Would benefit from SNF level of care upon discharge with follow-up OT services.    Follow Up Recommendations  SNF    Equipment Recommendations       Recommendations for Other Services       Precautions / Restrictions Precautions Precautions: Fall                                                    ADL either performed or assessed with clinical judgement   ADL Overall ADL's : Needs assistance/impaired Eating/Feeding: Independent   Grooming: Sitting;Set up   Upper Body Bathing: Set up;Sitting   Lower Body Bathing: Moderate assistance   Upper Body Dressing : Minimal assistance   Lower Body Dressing: Maximal assistance                 General ADL Comments: Pt. was  verbally educated about A/E use for LE ADLs. Pt. has a reacher, and sockaide at home, and was using them prior to hospitalization.     Vision Baseline Vision/History: Wears glasses Wears Glasses: At all times Patient Visual Report: No change from baseline       Perception     Praxis      Pertinent Vitals/Pain Pain Assessment: 0-10 Pain Score: 10-Worst pain ever Faces Pain Scale: Hurts worst Pain Location: All over. Pain Intervention(s): Limited activity within patient's tolerance;Monitored during session;Repositioned     Hand Dominance Right   Extremity/Trunk Assessment Upper Extremity Assessment Upper Extremity Assessment: Generalized weakness           Communication Communication Communication: No difficulties   Cognition Arousal/Alertness: Awake/alert Behavior During Therapy: WFL for tasks assessed/performed Overall Cognitive Status: Within Functional Limits for tasks assessed                                     General Comments       Exercises     Shoulder Instructions      Home Living Family/patient expects to be discharged to:: Private residence Living Arrangements: Children (Son) Available Help at Discharge: Family;Available 24 hours/day Type of Home: House Home Access: Stairs to enter Enter[employer] CorporationEntrance Stairs-Number  of Steps: 2 Entrance Stairs-Rails: Right;Left Home Layout: One level     Bathroom Shower/Tub: Tub/shower unit;Curtain   Bathroom Toilet: Handicapped height Bathroom Accessibility: Yes How Accessible: Accessible via walker Home Equipment: Walker - 2 wheels;Walker - 4 wheels;Shower seat;Cane - quad;Bedside commode          Prior Functioning/Environment Level of Independence: Independent                 OT Problem List: Decreased strength;Impaired balance (sitting and/or standing);Decreased activity tolerance;Decreased knowledge of use of DME or AE      OT Treatment/Interventions: Self-care/ADL training;DME and/or  AE instruction;Balance training;Therapeutic exercise;Patient/family education    OT Goals(Current goals can be found in the care plan section) Acute Rehab OT Goals Patient Stated Goal: To be more indpendent, and to figure out why she is falling. OT Goal Formulation: With patient Potential to Achieve Goals: Good  OT Frequency: Min 1X/week   Barriers to D/C:            Co-evaluation              AM-PAC PT "6 Clicks" Daily Activity     Outcome Measure Help from another person eating meals?: None Help from another person taking care of personal grooming?: A Little Help from another person toileting, which includes using toliet, bedpan, or urinal?: A Lot Help from another person bathing (including washing, rinsing, drying)?: A Lot Help from another person to put on and taking off regular upper body clothing?: A Little Help from another person to put on and taking off regular lower body clothing?: A Lot 6 Click Score: 16   End of Session    Activity Tolerance: Patient tolerated treatment well Patient left: in bed;with call bell/phone within reach;with bed alarm set  OT Visit Diagnosis: Unsteadiness on feet (R26.81);Muscle weakness (generalized) (M62.81);History of falling (Z91.81)                Time: 2130-8657 OT Time Calculation (min): 25 min Charges:  OT General Charges $OT Visit: 1 Procedure OT Evaluation $OT Eval Moderate Complexity: 1 Procedure G-Codes: OT G-codes **NOT FOR INPATIENT CLASS** Functional Assessment Tool Used: Clinical judgement Functional Limitation: Self care Self Care Current Status (Q4696): At least 40 percent but less than 60 percent impaired, limited or restricted Self Care Goal Status (E9528): At least 1 percent but less than 20 percent impaired, limited or restricted   Olegario Messier, MS, OTR/L   Olegario Messier, MS, OTR/L 09/08/2016, 10:55 AM

## 2016-09-08 NOTE — Consult Note (Signed)
[place] [facility] Consult   Reason for Consult:  Consult for 66 year old woman in the hospital with hyponatremia and hypotension. Question about her psychiatric medicine as well as abnormal movements Referring Physician:  Manuella Ghazi Patient Identification: Kelly Foster MRN:  [id number] Principal Diagnosis: Bipolar 1 disorder, mixed ([place]) Diagnosis:   Patient Active Problem List   Diagnosis Date Noted  . Failure to thrive in adult [R62.7] 08/28/2016  . Orthostatic hypotension [I95.1] 08/28/2016  . Hyponatremia [E87.1] 08/28/2016  . Hyperglycemia [R73.9] 08/28/2016  . Pyuria [N39.0] 08/28/2016  . Protein malnutrition ([place]) [E46] 08/13/2016  . Hemorrhoids [K64.9] 07/11/2016  . Hyperlipidemia [E78.5]   . Depression [F32.9]   . Central spinal stenosis [M48.00]   . Cervical radiculopathy, chronic [M54.12]   . OA (osteoarthritis) of hip [M16.9]   . Chronic pain syndrome [G89.4]   . Chronic low back pain without sciatica [M54.5, G89.29]   . Hypothyroidism [E03.9]   . Reflux esophagitis [K21.0]   . Sacroiliac joint dysfunction [M53.3] 04/05/2015  . 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 Intractable pain [R52] 12/20/2014  . Neuritis or radiculitis due to rupture of lumbar intervertebral disc [M51.16] 05/29/2014  . Lumbar radiculitis [M54.16] 05/29/2014  . Bipolar 1 disorder, mixed ([place]) [F31.60] 06/06/2013  . Chronic adrenal insufficiency ([place]) [E27.40] 06/06/2013  . Elevated CK [R74.8] 06/06/2013  . Below normal amount of sodium in the blood [E87.1] 06/06/2013  . Adynamia [G72.3] 06/06/2013    Total Time spent with patient: 1 hour  Subjective:   Kelly Foster is a 66 y.o. female patient admitted with "I've had a lot of falls".  HPI:  Patient interviewed. Chart reviewed. 65 year old woman currently in the hospital complaining of multiple falls at home as well as dizziness. Patient says this is been happening for the last week or so. She tells me  that she has fallen 4-5 times a day. She says she usually falls backwards. Does not lose consciousness. Not aware of seizures. She says she has had a lot of trouble keeping food down recently. She will feel sick to her stomach and feel like vomiting every time she eats food. However, she tells me that she has been drinking a large amount of water every day in an attempt to stay hydrated. She has been compliant with her psychiatric medicine. As far as psychiatric symptoms she denies any depression. Not feeling sad down and hopeless or depressed. She says she sleeps fairly well at night. Totally denies suicidal thought. Denies hallucinations or psychosis. Denies any symptoms of mania. She is not abusing any drugs or alcohol.  Social history: Patient says she lives independently although she has adult children particularly a son who check up on her regularly.  Medical history: History of chronic pain on chronic narcotics. Multiple other medical problems. Current workup around possibility of adrenal disease.  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  Past Psychiatric History: Patient has a diagnosis of bipolar disorder. She sees Kelly Foster for outpatient psychiatric care. Denies any history of hospitalization. Denies any history of suicide attempts. She will only describe her illness as being "I go up and down". Patient is currently on Depakote amitriptyline and sertraline. She says she has been on the same medicines for years. Can't remember any prior psychiatric medicine. I reviewed multiple antipsychotic medicines and she recognizes none of their names and denied ever being on any  of them.  Risk to Self: Is patient at risk for suicide?: No Risk to Others:   Prior Inpatient Therapy:   Prior Outpatient Therapy:    Past Medical History:  Past Medical History:  Diagnosis Date  . Adrenal insufficiency ([place])    . Allergy   . Anxiety   . Arthritis   . Bipolar 1 disorder ([place])   . Candidiasis of mouth and esophagus ([place]) 08/13/2016  . Central spinal stenosis   . Cervical radiculopathy, chronic   . Chronic adrenal insufficiency ([place]) 06/06/2013  . Chronic low back pain without sciatica   . Chronic pain syndrome   . Depression   . Disorder of left sacroiliac joint   . DJD (degenerative joint disease)   . Elevated CK   . Hemorrhoids 07/11/2016  . Hip pain   . History of kidney stones   . Hyperlipidemia   . Hypertension   . Hyponatremia   . Hypothyroidism   . Lower urinary tract infection 06/06/2013   Overview:  IMO Problem List Replacer Jan. 2016   . Lumbar radiculitis   . Manic depressive disorder ([place])   . Neuritis or radiculitis due to rupture of lumbar intervertebral disc 05/29/2014  . OA (osteoarthritis) of hip   . Overweight   . Prediabetes   . Reflux esophagitis   . Sacrococcygeal disorders, not elsewhere classified   . Sacroiliac joint dysfunction 04/05/2015  . Trochanteric bursitis of left hip   . Ulnar neuropathy   . Urinary frequency   . Vaginal atrophy     Past Surgical History:  Procedure Laterality Date  . ABDOMINAL HYSTERECTOMY    . arm surgery Left   . CESAREAN SECTION    . JOINT REPLACEMENT Left 2016   dec  . TOTAL HIP ARTHROPLASTY Left 12/26/2014   Procedure: TOTAL HIP ARTHROPLASTY ANTERIOR APPROACH;  Surgeon: Hessie Knows, MD;  Location: [facility] ORS;  Service: Orthopedics;  Laterality: Left;   Family History:  Family History  Problem Relation Age of Onset  . Alzheimer's disease Mother   . Arthritis Mother   . Stroke Father   . Hypertension Father   . Colon cancer Sister   . Hypertension Sister   . Kidney disease Neg Hx    Family Psychiatric  History: Denies knowing of any family history of mental illness Social History:  History  Alcohol Use No     History  Drug Use No    Comment: Former hx of Crack Cocaine quit 1998    Social History   Social History   . Marital status: Single    Spouse name: N/A  . Number of children: N/A  . Years of education: N/A   Occupational History  . disabled    Social History Main Topics  . Smoking status: Former Smoker    Packs/day: 1.00    Types: Cigarettes    Quit date: 03/01/1995  . Smokeless tobacco: Never Used     Comment: quit 1998  . Alcohol use No  . Drug use: No     Comment: Former hx of Crack Cocaine quit 1998  . Sexual activity: Not Asked   Other Topics Concern  . None   Social History Narrative  . None   Additional Social History:    Allergies:   Allergies  Allergen Reactions  . Gabapentin Other (See Comments)    tremors tremors  . Codeine Itching  . Pregabalin Other (See Comments)    Left side pain under arm, started after use of  lyrica  . Tramadol Itching    Labs:  Results for orders placed or performed during the hospital encounter of 09/06/16 (from the past 48 hour(s))  Basic metabolic panel     Status: Abnormal   Collection Time: 09/07/16  3:43 AM  Result Value Ref Range   Sodium 132 (L) 135 - 145 mmol/L   Potassium 3.4 (L) 3.5 - 5.1 mmol/L   Chloride 101 101 - 111 mmol/L   CO2 26 22 - 32 mmol/L   Glucose, Bld 91 65 - 99 mg/dL   BUN <5 (L) 6 - 20 mg/dL   Creatinine, Ser 0.49 0.44 - 1.00 mg/dL   Calcium 8.0 (L) 8.9 - 10.3 mg/dL   GFR calc non Af Amer >60 >60 mL/min   GFR calc Af Amer >60 >60 mL/min    Comment: (NOTE) The eGFR has been calculated using the CKD EPI equation. This calculation has not been validated in all clinical situations. eGFR's persistently <60 mL/min signify possible Chronic Kidney Disease.    Anion gap 5 5 - 15  Basic metabolic panel     Status: Abnormal   Collection Time: 09/08/16  3:08 AM  Result Value Ref Range   Sodium 129 (L) 135 - 145 mmol/L   Potassium 3.6 3.5 - 5.1 mmol/L   Chloride 97 (L) 101 - 111 mmol/L   CO2 27 22 - 32 mmol/L   Glucose, Bld 98 65 - 99 mg/dL   BUN <5 (L) 6 - 20 mg/dL   Creatinine, Ser 0.45 0.44 - 1.00  mg/dL   Calcium 8.2 (L) 8.9 - 10.3 mg/dL   GFR calc non Af Amer >60 >60 mL/min   GFR calc Af Amer >60 >60 mL/min    Comment: (NOTE) The eGFR has been calculated using the CKD EPI equation. This calculation has not been validated in all clinical situations. eGFR's persistently <60 mL/min signify possible Chronic Kidney Disease.    Anion gap 5 5 - 15    Current Facility-Administered Medications  Medication Dose Route Frequency Provider Last Rate Last Dose  . 0.9 %  sodium chloride infusion  250 mL Intravenous PRN Baxter Hire, MD      . 0.9 %  sodium chloride infusion   Intravenous Continuous Holley Raring, [place], MD 75 mL/hr at 09/08/16 1051    . acetaminophen (TYLENOL) tablet 650 mg  650 mg Oral Q6H PRN Theodoro Grist, MD      . albuterol (PROVENTIL) (2.5 MG/3ML) 0.083% nebulizer solution 3 mL  3 mL Inhalation Q6H PRN Baxter Hire, MD      . aspirin EC tablet 81 mg  81 mg Oral Daily Baxter Hire, MD   81 mg at 09/08/16 1046  . bisacodyl (DULCOLAX) EC tablet 5 mg  5 mg Oral QHS PRN Baxter Hire, MD   5 mg at 09/07/16 1111  . calcium-vitamin D (OSCAL WITH D) 500-200 MG-UNIT per tablet 1 tablet  1 tablet Oral Daily Baxter Hire, MD   1 tablet at 09/08/16 1046  . chlorhexidine (PERIDEX) 0.12 % solution 15 mL  15 mL Mouth Rinse BID Vaughan Basta, MD   15 mL at 09/08/16 1045  . divalproex (DEPAKOTE) DR tablet 500 mg  500 mg Oral BID Baxter Hire, MD   500 mg at 09/08/16 1045  . enoxaparin (LOVENOX) injection 40 mg  40 mg Subcutaneous Q24H Baxter Hire, MD   40 mg at 09/07/16 2121  . HYDROcodone-acetaminophen (NORCO/VICODIN) 5-325 MG per tablet 1  tablet  1 tablet Oral Q6H PRN Theodoro Grist, MD   1 tablet at 09/08/16 1225  . hydrocortisone (CORTEF) tablet 5 mg  5 mg Oral Daily Baxter Hire, MD   5 mg at 09/08/16 1045  . MEDLINE mouth rinse  15 mL Mouth Rinse q12n4p Vaughan Basta, MD      . montelukast (SINGULAIR) tablet 10 mg  10 mg Oral QHS Baxter Hire, MD   10 mg at 09/07/16 2121  . multivitamin with minerals tablet 1 tablet  1 tablet Oral Daily Baxter Hire, MD   1 tablet at 09/08/16 1044  . oxybutynin (DITROPAN-XL) 24 hr tablet 5 mg  5 mg Oral QHS Baxter Hire, MD   5 mg at 09/07/16 2121  . polyvinyl alcohol (LIQUIFILM TEARS) 1.4 % ophthalmic solution 1 drop  1 drop Both Eyes PRN Max Sane, MD   1 drop at 09/08/16 1626  . senna-docusate (Senokot-S) tablet 2 tablet  2 tablet Oral BID Baxter Hire, MD   2 tablet at 09/08/16 1044  . sertraline (ZOLOFT) tablet 200 mg  200 mg Oral Daily Baxter Hire, MD   200 mg at 09/08/16 1044  . simvastatin (ZOCOR) tablet 40 mg  40 mg Oral QHS Baxter Hire, MD   40 mg at 09/07/16 2121  . sodium chloride flush (NS) 0.9 % injection 3 mL  3 mL Intravenous Q12H Baxter Hire, MD   3 mL at 09/08/16 1046  . sodium chloride flush (NS) 0.9 % injection 3 mL  3 mL Intravenous PRN Baxter Hire, MD        Musculoskeletal: Strength & Muscle Tone: decreased Gait & Station: unsteady Patient leans: N/A  Psychiatric Specialty Exam: Physical Exam  Nursing note and vitals reviewed. Constitutional: She appears well-developed and well-nourished.  HENT:  Head: Normocephalic and atraumatic.  Eyes: Pupils are equal, round, and reactive to light. Conjunctivae are normal.  Neck: Normal range of motion.  Cardiovascular: Regular rhythm and normal heart sounds.   Respiratory: Effort normal.  GI: Soft.  Musculoskeletal: Normal range of motion.  Neurological: She is alert.  Patient has diffuse abnormal movements in the face upper body and lower body all of which could be consistent with tardive dyskinesia although that would not be the only consideration  Skin: Skin is warm and dry.  Psychiatric: She has a normal mood and affect. Her speech is normal and behavior is normal. Judgment normal. Thought content is not paranoid. Cognition and memory are normal. She expresses no homicidal and no  suicidal ideation.    Review of Systems  Constitutional: Negative.   HENT: Negative.   Eyes: Negative.   Respiratory: Negative.   Cardiovascular: Negative.   Gastrointestinal: Positive for nausea and vomiting.  Musculoskeletal: Positive for falls.  Skin: Negative.   Neurological: Negative.   Psychiatric/Behavioral: Negative for depression, hallucinations, memory loss, substance abuse and suicidal ideas. The patient is not nervous/anxious and does not have insomnia.     Blood pressure 135/70, pulse 88, temperature 99 F (37.2 C), temperature source Oral, resp. rate 18, height 5' 7"  (1.702 m), weight 81.6 kg (180 lb), SpO2 95 %.Body mass index is 28.19 kg/m.  General Appearance: Fairly Groomed  Eye Contact:  Fair  Speech:  Slow  Volume:  Decreased  Mood:  Euthymic  Affect:  Constricted  Thought Process:  Coherent  Orientation:  Full (Time, Place, and Person)  Thought Content:  Logical  Suicidal Thoughts:  No  Homicidal  Thoughts:  No  Memory:  Immediate;   Good Recent;   Fair Remote;   Fair  Judgement:  Fair  Insight:  Fair  Psychomotor Activity:  TD and Tremor  Concentration:  Concentration: Fair  Recall:  [employer] of Knowledge:  Fair  Language:  Fair  Akathisia:  No  Handed:  Right  AIMS (if indicated):     Assets:  Desire for Improvement Financial Resources/Insurance Housing Resilience Social Support  ADL's:  Intact  Cognition:  WNL  Sleep:        Treatment Plan Summary: Daily contact with patient to assess and evaluate symptoms and progress in treatment, Medication management and Plan 65 year old woman with a history of bipolar disorder. Currently asymptomatic without any specific psychiatric syndrome. Question arose about her medicine. She is on modest doses of Depakote amitriptyline and sertraline. She has been on these medicines for a long time and says they have been helpful for her. As far as her nausea it is possible that Depakote and Zoloft could be  associated with this although the time course does not really match up. She says that her stomach problems of been much more recent. Almost any psychiatric medicine could have at least some possibility of SIADH. Also the time course does not match up very well. I would probably await further workup by nephrology before changing medicines based on that. One finding I think may BE significant is her spontaneously telling me that she drinks very large amounts of water particularly 5 or 6 bottles of it a day. This could account for her hyponatremia. As for her tremor, the patient is consumed by abnormal movements that would potentially be very typical of tardive dyskinesia. She denies however any antipsychotic medication and I couldn't find anything in her old chart to suggest that she had a history of exposure to antipsychotics. It is very rare to see tardive dyskinesia especially this severe from exposure to antidepressants. The patient herself says that she thinks that her abnormal movements are related to a history of having had a stroke as they started about the same time that she was told she had a stroke last year. Based on this also I will not change any of her medicine. I will continue to follow-up. For now no change to specific treatment.  Disposition: No evidence of imminent risk to self or others at present.   Patient does not meet criteria for psychiatric inpatient admission. Supportive therapy provided about ongoing stressors.  Alethia Berthold, MD 09/08/2016 7:56 PM

## 2016-09-09 ENCOUNTER — Other Ambulatory Visit: Payer: Medicare HMO

## 2016-09-09 LAB — BASIC METABOLIC PANEL
Anion gap: 5 (ref 5–15)
BUN: <5 mg/dL — ABNORMAL LOW (ref 6–20)
CHLORIDE: 103 mmol/L (ref 101–111)
CO2: 26 mmol/L (ref 22–32)
CREATININE: 0.49 mg/dL (ref 0.44–1.00)
Calcium: 8.2 mg/dL — ABNORMAL LOW (ref 8.9–10.3)
GFR calc non Af Amer: >60 mL/min (ref >60)
Glucose, Bld: 96 mg/dL (ref 65–99)
Potassium: 3.8 mmol/L (ref 3.5–5.1)
Sodium: 134 mmol/L — ABNORMAL LOW (ref 135–145)

## 2016-09-09 LAB — CBC
HEMATOCRIT: 27.7 % — AB (ref 35.0–47.0)
HEMOGLOBIN: 9.5 g/dL — AB (ref 12.0–16.0)
MCH: 30.3 pg (ref 26.0–34.0)
MCHC: 34.2 g/dL (ref 32.0–36.0)
MCV: 88.6 fL (ref 80.0–100.0)
Platelets: 441 10*3/uL — ABNORMAL HIGH (ref 150–440)
RBC: 3.12 MIL/uL — ABNORMAL LOW (ref 3.80–5.20)
RDW: 14.7 % — ABNORMAL HIGH (ref 11.5–14.5)
WBC: 5.9 10*3/uL (ref 3.6–11.0)

## 2016-09-09 MED ORDER — HYDROCORTISONE 5 MG PO TABS: 5.0000 mg | ORAL_TABLET | Freq: Every day | ORAL | 0 refills | Status: DC

## 2016-09-09 NOTE — Evaluation (Signed)
Occupational Therapy Evaluation Patient Details Name: Adalberto Coleeggy Jean Wedig MRN: [id number] DOB: 05/01/1950 Today's Date: 09/09/2016    History of Present Illness Pt.  is a [age] y.o. female who [facility] with multiple falls, low BP, and UTI.  Pt. PMHx includes: Adrenal Insufficiency, Bipolar 1 Disorder, cervical spinal stenosis, chronic Low back Pain, Candidiasis, Sciatica, DJD, Anxiety, Anxiety Manic Depressive Disorder, Trochnateric Bursitis of the Left Hip, Sacrococcygeal Disorder, Sacroiliac Joint Dysfunction.    Clinical Impression   Pt. education was provided about energy conservation, work simplification techniques for LE ADLs/IADLs. Pt. education was provided verbally, and pt. was provided with a visual handout. Pt. Continues to present with weakness, limited activity tolerance, and limited functional mobility. Pt. plans to return home, and resume home health services. Pt. could benefit from OT services upon discharge.    Follow Up Recommendations  Home health OT    Equipment Recommendations       Recommendations for Other Services       Precautions / Restrictions Precautions Precautions: Fall Restrictions Weight Bearing Restrictions: No                                                    ADL either performed or assessed with clinical judgement   ADL Overall ADL's : Needs assistance/impaired Eating/Feeding: Independent   Grooming: Set up   Upper Body Bathing: Set up   Lower Body Bathing: Moderate assistance   Upper Body Dressing : Minimal assistance   Lower Body Dressing: Maximal assistance                 General ADL Comments: Pt. education was provided about energy conservation/work simplification techniques for ADLs, and IADLs. Pt. education was provided verbally. Pt. was provided with a visual handout.     Vision Baseline Vision/History: Wears glasses Wears Glasses: At all times Patient Visual Report: No change from  baseline       Perception     Praxis      Pertinent Vitals/Pain Pain Assessment: No/denies pain     Hand Dominance     Extremity/Trunk Assessment             Communication     Cognition Arousal/Alertness: Awake/alert Behavior During Therapy: WFL for tasks assessed/performed Overall Cognitive Status: Within Functional Limits for tasks assessed                                     General Comments       Exercises     Shoulder Instructions      Home Living                                          Prior Functioning/Environment                   OT Problem List:        OT Treatment/Interventions:      OT Goals(Current goals can be found in the care plan section) Acute Rehab OT Goals Patient Stated Goal: To be more indpendent, and to figure out why she is falling. OT Goal Formulation: With patient Potential to Achieve Goals: Good  OT Frequency: Min 1X/week   Barriers to D/C:            Co-evaluation              AM-PAC PT "6 Clicks" Daily Activity     Outcome Measure Help from another person eating meals?: None Help from another person taking care of personal grooming?: A Little Help from another person toileting, which includes using toliet, bedpan, or urinal?: A Lot Help from another person bathing (including washing, rinsing, drying)?: A Lot Help from another person to put on and taking off regular upper body clothing?: A Little Help from another person to put on and taking off regular lower body clothing?: A Lot 6 Click Score: 16   End of Session    Activity Tolerance: Patient tolerated treatment well Patient left: in bed;with call bell/phone within reach;with bed alarm set  OT Visit Diagnosis: Unsteadiness on feet (R26.81);Muscle weakness (generalized) (M62.81);History of falling (Z91.81)                Time: 1025-1040 OT Time Calculation (min): 15 min Charges:  OT General Charges $OT Visit: 1  Procedure OT Evaluation $OT Eval Moderate Complexity: 1 Procedure OT Treatments $Self Care/Home Management : 8-22 mins G-Codes: OT G-codes **NOT FOR INPATIENT CLASS** Functional Assessment Tool Used: Clinical judgement Functional Limitation: Self care Self Care Current Status (U0454): At least 40 percent but less than 60 percent impaired, limited or restricted Self Care Goal Status (U9811): At least 1 percent but less than 20 percent impaired, limited or restricted   Olegario Messier, MS, OTR/L  Olegario Messier, MS, OTR/L 09/09/2016, 2:41 PM

## 2016-09-09 NOTE — Discharge Instructions (Signed)
Hyponatremia Hyponatremia is when the amount of salt (sodium) in your blood is too low. When salt levels are low, your cells absorb extra water and they swell. The swelling happens throughout the body, but it mostly affects the brain. Follow these instructions at home:  Take medicines only as told by your doctor. Many medicines can make this condition worse. Talk with your doctor about any medicines that you are currently taking.  Carefully follow a recommended diet as told by your doctor.  Carefully follow instructions from your doctor about fluid restrictions.  Keep all follow-up visits as told by your doctor. This is important.  Do not drink alcohol. Contact a doctor if:  You feel sicker to your stomach (nauseous).  You feel more confused.  You feel more tired (fatigued).  Your headache gets worse.  You feel weaker.  Your symptoms go away and then they come back.  You have trouble following the diet instructions. Get help right away if:  You start to twitch and shake (have a seizure).  You pass out (faint).  You keep having watery poop (diarrhea).  You keep throwing up (vomiting). This information is not intended to replace advice given to you by your health care provider. Make sure you discuss any questions you have with your health care provider. Document Released: 10/02/2010 Document Revised: 06/28/2015 Document Reviewed: 01/16/2014 Elsevier Interactive Patient Education  2018 [employer]  

## 2016-09-09 NOTE — Consult Note (Signed)
   [facility] Consult   09/09/2016  Kelly Foster 11/17/1950 [id number]  Referral received by inpatient provider for [facility] Care Management services and post hospital discharge follow up related to multiple falls at home and 2 admits in 6 months. Patient was evaluated for community based chronic disease management services with [facility] as a benefit of [facility]'s [facility]. Called into patient's room to explain [facility]. Patient endorses her primary care provider to be Wilhelmina McardleLauren Kennedy. Patient states she is very interested in services. Verbal consent recieved. Patient gave [phone] as the best number to reach her. Patient will receive post hospital discharge calls and be evaluated for monthly home visits. [facility] Care [facility] services does not interfere with or replace any services arranged by the inpatient care management team.  Made inpatient RNCM aware that [facility] will be following for care management. For additional questions please contact:   Ozan Maclay RN, BSN [facility]  Hospital Liaison  [phone]) Business Mobile [phone]) Toll free office

## 2016-09-09 NOTE — Care Management Important Message (Signed)
Important Message  Patient Details  Name: Kelly Foster MRN: [id number] Date of Birth: 03/26/1950   Medicare Important Message Given:  Yes    Marily MemosLisa M Jahquez Steffler, RN 09/09/2016, 1:42 PM

## 2016-09-09 NOTE — Progress Notes (Signed)
[facility]  ROUNDING NOTE   Subjective:  Serum sodium has improved to 134 today. She reports that her appetite has improved. Resting in bed at the moment.  Objective:  Vital signs in last 24 hours:  Temp:  [98.1 F (36.7 C)-98.4 F (36.9 C)] 98.4 F (36.9 C) (08/07 0843) Pulse Rate:  [87-88] 88 (08/07 0843) Resp:  [18-19] 18 (08/07 0843) BP: (127-135)/(69-70) 130/69 (08/07 0843) SpO2:  [91 %-99 %] 91 % (08/07 0843)  Weight change:  Filed Weights   09/06/16 1532  Weight: 81.6 kg (180 lb)    Intake/Output: I/O last 3 completed shifts: In: 2743.8 [P.O.:1320; I.V.:1423.8] Out: 2550 [Urine:2550]   Intake/Output this shift:  Total I/O In: 402.5 [P.O.:240; I.V.:162.5] Out: 300 [Urine:300]  Physical Exam: General: No acute distress  Head: Normocephalic, atraumatic. Moist oral mucosal membranes  Eyes: Anicteric  Neck: Supple, trachea midline  Lungs:  Clear to auscultation, normal effort  Heart: S1S2 no rubs  Abdomen:  Soft, nontender, bowel sounds present  Extremities: Trace peripheral edema.  Neurologic: Awake, alert, following commands  Skin: No lesions       Basic Metabolic Panel:  Recent Labs Lab 09/06/16 1540 09/07/16 0343 09/08/16 0308 09/09/16 0426  NA 127* 132* 129* 134*  K 3.3* 3.4* 3.6 3.8  CL 93* 101 97* 103  CO2 25 26 27 26   GLUCOSE 78 91 98 96  BUN <5* <5* <5* <5*  CREATININE 0.63 0.49 0.45 0.49  CALCIUM 8.7* 8.0* 8.2* 8.2*    Liver Function Tests:  Recent Labs Lab 09/06/16 1540  AST 49*  ALT 26  ALKPHOS 127*  BILITOT 0.4  PROT 6.5  ALBUMIN 2.5*   No results for input(s): LIPASE, AMYLASE in the last 168 hours. No results for input(s): AMMONIA in the last 168 hours.  CBC:  Recent Labs Lab 09/06/16 1540 09/09/16 0426  WBC 3.7 5.9  NEUTROABS 2.1  --   HGB 11.5* 9.5*  HCT 33.9* 27.7*  MCV 89.2 88.6  PLT 434 441*    Cardiac Enzymes:  Recent Labs Lab 09/06/16 1540  TROPONINI <0.03    BNP: Invalid  input(s): POCBNP  CBG: No results for input(s): GLUCAP in the last 168 hours.  Microbiology: Results for orders placed or performed during the hospital encounter of 09/06/16  Blood Culture (routine x 2)     Status: None (Preliminary result)   Collection Time: 09/06/16  3:40 PM  Result Value Ref Range Status   Specimen Description BLOOD LEFT ANTECUBITAL  Final   Special Requests   Final    BOTTLES DRAWN AEROBIC AND ANAEROBIC Blood Culture adequate volume   Culture NO GROWTH 3 DAYS  Final   Report Status PENDING  Incomplete  Blood Culture (routine x 2)     Status: None (Preliminary result)   Collection Time: 09/06/16  3:40 PM  Result Value Ref Range Status   Specimen Description BLOOD RIGHT ANTECUBITAL  Final   Special Requests   Final    BOTTLES DRAWN AEROBIC AND ANAEROBIC Blood Culture adequate volume   Culture NO GROWTH 3 DAYS  Final   Report Status PENDING  Incomplete  Urine culture     Status: Abnormal   Collection Time: 09/06/16  3:40 PM  Result Value Ref Range Status   Specimen Description URINE, RANDOM  Final   Special Requests NONE  Final   Culture 20,000 COLONIES/mL ENTEROBACTER SPECIES (A)  Final   Report Status 09/08/2016 FINAL  Final   Organism ID, Bacteria ENTEROBACTER  SPECIES (A)  Final      Susceptibility   Enterobacter species - MIC*    CEFAZOLIN >=64 RESISTANT Resistant     CEFTRIAXONE 32 INTERMEDIATE Intermediate     CIPROFLOXACIN <=0.25 SENSITIVE Sensitive     GENTAMICIN <=1 SENSITIVE Sensitive     IMIPENEM 1 SENSITIVE Sensitive     NITROFURANTOIN 64 INTERMEDIATE Intermediate     TRIMETH/SULFA <=20 SENSITIVE Sensitive     PIP/TAZO >=128 RESISTANT Resistant     * 20,000 COLONIES/mL ENTEROBACTER SPECIES    Coagulation Studies: No results for input(s): LABPROT, INR in the last 72 hours.  Urinalysis:  Recent Labs  09/06/16 1540  COLORURINE STRAW*  LABSPEC 1.004*  PHURINE 7.0  GLUCOSEU NEGATIVE  HGBUR NEGATIVE  BILIRUBINUR NEGATIVE  KETONESUR 5*   PROTEINUR NEGATIVE  NITRITE NEGATIVE  LEUKOCYTESUR NEGATIVE      Imaging: No results found.   Medications:   . sodium chloride    . sodium chloride 75 mL/hr (09/09/16 0214)   . aspirin EC  81 mg Oral Daily  . calcium-vitamin D  1 tablet Oral Daily  . chlorhexidine  15 mL Mouth Rinse BID  . divalproex  500 mg Oral BID  . enoxaparin (LOVENOX) injection  40 mg Subcutaneous Q24H  . hydrocortisone  5 mg Oral Daily  . mouth rinse  15 mL Mouth Rinse q12n4p  . montelukast  10 mg Oral QHS  . multivitamin with minerals  1 tablet Oral Daily  . oxybutynin  5 mg Oral QHS  . senna-docusate  2 tablet Oral BID  . sertraline  200 mg Oral Daily  . simvastatin  40 mg Oral QHS  . sodium chloride flush  3 mL Intravenous Q12H   sodium chloride, acetaminophen, albuterol, bisacodyl, HYDROcodone-acetaminophen, polyvinyl alcohol, sodium chloride flush  Assessment/ Plan:  66 y.o. female with a PMHx of Mild adrenal insufficiency, anxiety, bipolar disorder, cervical radiculopathy, chronic low back pain, depression, hyperlipidemia, hypertension, prior history of hyponatremia, hypothyroidism, osteoarthritis who was admitted to [facility] on 09/06/2016 for evaluation of recurrent falls.   1.  Hyponatremia. 2.  Hypokalemia. 3.  Prior history of adrenal insufficiency 4.  Hypotension.   Plan:  Serum sodium has corrected with 0.9 normal saline. There was prior history of mild adrenal insufficiency. However her most recent serum cortisol was 15. This makes adrenal insufficiency less likely. Continue IV fluid hydration for now. She will likely need endocrinology evaluation as an outpatient as well. Serum potassium has now corrected and is currently 3.8.  Blood pressure also improved at 130/69. We will continue to monitor while she is here.   LOS: 1 Kelly Foster 8/7/201811:29 AM

## 2016-09-09 NOTE — Care Management Note (Signed)
Case Management Note  Patient Details  Name: Kelly Foster MRN: [id number] Date of Birth: 10/26/1950  Subjective/Objective:  RNCM assessment for discharge planning. Met with patient at bedside. She has a rolling walker and a Rolator. She is active with [facility] for nursing and PT. Alvis Lemmings has been notified of admission. Lives alone.Has family that assist her as needed.  Copy. PCP is Cassell Smiles                    Action/Plan: Following progression.  Expected Discharge Date:  09/08/16               Expected Discharge Plan:  [place]  In-House Referral:     Discharge planning Services  CM Consult  Post Acute Care Choice:  Home Health, Resumption of Svcs/PTA Provider Choice offered to:     DME Arranged:    DME Agency:     HH Arranged:    HH Agency:  [place]  Status of Service:  In process, will continue to follow  If discussed at Long Length of Stay Meetings, dates discussed:    Additional Comments:  Jolly Mango, RN 09/09/2016, 9:07 AM

## 2016-09-09 NOTE — Care Management Note (Addendum)
Case Management Note  Patient Details  Name: Kelly Foster MRN: [id number] Date of Birth: 08/28/1950  Subjective/Objective:  Discharging today                  Action/Plan: [facility] notified of [facility]  Expected Discharge Date:  09/09/16               Expected Discharge Plan:  Home w Home Health Services  In-House Referral:     Discharge planning Services  CM Consult  Post Acute Care Choice:  Home Health, Resumption of Svcs/PTA Provider Choice offered to:     DME Arranged:    DME Agency:     HH Arranged:  RN, PT [facility]  Status of Service:  Completed, signed off  If discussed at Long Length of Stay Meetings, dates discussed:    Additional Comments:  Marily MemosLisa M Khyren Hing, RN 09/09/2016, 1:41 PM

## 2016-09-09 NOTE — Progress Notes (Signed)
Patient discharged home via family vehicle. Discharge instructions provided to patient, made aware of prescription medications at local pharmacy, made aware of follow-up appointments. 2 PIV's removed prior to discharge. Patient had no further questions at time of discharge.

## 2016-09-10 ENCOUNTER — Telehealth: Payer: Self-pay | Admitting: Nurse Practitioner

## 2016-09-10 ENCOUNTER — Telehealth: Payer: Self-pay

## 2016-09-10 ENCOUNTER — Encounter: Payer: Medicare HMO | Admitting: Nurse Practitioner

## 2016-09-10 NOTE — Discharge Summary (Signed)
[facility] - [place] at [facility]   PATIENT NAME: Kelly Foster    MR#:  [id number]  DATE OF BIRTH:  [birth year]  DATE OF ADMISSION:  09/06/2016   ADMITTING PHYSICIAN: Gracelyn Nurse, MD  DATE OF DISCHARGE: 09/09/2016  4:10 PM  PRIMARY CARE PHYSICIAN: Galen Manila, NP   ADMISSION DIAGNOSIS:  Orthostatic hypotension [I95.1] Hyponatremia [E87.1] Weakness [R53.1] DISCHARGE DIAGNOSIS:  Principal Problem:   Bipolar 1 disorder, mixed (HCC) Active Problems:   Orthostatic hypotension   Hyponatremia  SECONDARY DIAGNOSIS:   Past Medical History:  Diagnosis Date  . Adrenal insufficiency (HCC)   . Allergy   . Anxiety   . Arthritis   . Bipolar 1 disorder (HCC)   . Candidiasis of mouth and esophagus (HCC) 08/13/2016  . Central spinal stenosis   . Cervical radiculopathy, chronic   . Chronic adrenal insufficiency (HCC) 06/06/2013  . Chronic low back pain without sciatica   . Chronic pain syndrome   . Depression   . Disorder of left sacroiliac joint   . DJD (degenerative joint disease)   . Elevated CK   . Hemorrhoids 07/11/2016  . Hip pain   . History of kidney stones   . Hyperlipidemia   . Hypertension   . Hyponatremia   . Hypothyroidism   . Lower urinary tract infection 06/06/2013   Overview:  IMO Problem List Replacer Jan. 2016   . Lumbar radiculitis   . Manic depressive disorder (HCC)   . Neuritis or radiculitis due to rupture of lumbar intervertebral disc 05/29/2014  . OA (osteoarthritis) of hip   . Overweight   . Prediabetes   . Reflux esophagitis   . Sacrococcygeal disorders, not elsewhere classified   . Sacroiliac joint dysfunction 04/05/2015  . Trochanteric bursitis of left hip   . Ulnar neuropathy   . Urinary frequency   . Vaginal atrophy    HOSPITAL COURSE:   1. Orthostatic hypotension.  -Responding well to hydrocortisone that she was on before for chronic adrenal insufficiency. - stop elavil as that can be contributing as well  2.  Chronic adrenal insufficiency - Responding to hydrocortisone 5 mg once daily and discharged on same  3. Hyponatremia. Mild - Sodium 134 on D/C  4. Hypokalemia: Repleted  5. Bipolar disorder. Continue her home medications  6. Involuntary movements - no new recommendations per Neuro and Psych. Recommend outpt eval with her PCP and specialist  DISCHARGE CONDITIONS:  stable CONSULTS OBTAINED:  Treatment Team:  Mosetta Pigeon, MD Pauletta Browns, MD Clapacs, Jackquline Denmark, MD DRUG ALLERGIES:   Allergies  Allergen Reactions  . Gabapentin Other (See Comments)    tremors tremors  . Codeine Itching  . Pregabalin Other (See Comments)    Left side pain under arm, started after use of lyrica  . Tramadol Itching   DISCHARGE MEDICATIONS:   Allergies as of 09/09/2016      Reactions   Gabapentin Other (See Comments)   tremors tremors   Codeine Itching   Pregabalin Other (See Comments)   Left side pain under arm, started after use of lyrica   Tramadol Itching      Medication List    STOP taking these medications   amitriptyline 50 MG tablet Commonly known as:  ELAVIL     TAKE these medications   acyclovir 400 MG tablet Commonly known as:  ZOVIRAX Take 400 mg by mouth 3 (three) times daily as needed (for flares).   albuterol 108 (90 Base) MCG/ACT inhaler  Commonly known as:  PROVENTIL HFA;VENTOLIN HFA Inhale 2 puffs into the lungs every 6 (six) hours as needed for wheezing or shortness of breath.   ARTIFICIAL TEARS 0.1-0.3 % Soln Generic drug:  Dextran 70-Hypromellose Administer 1 drop to both eyes 4 (four) times a day as needed.   aspirin 81 MG tablet Take 1 tablet (81 mg total) by mouth daily.   CALCIUM 600-D 600-400 MG-UNIT Tabs Generic drug:  Calcium Carbonate-Vitamin D3 Take 1 tablet by mouth daily.   divalproex 500 MG DR tablet Commonly known as:  DEPAKOTE Take 500 mg by mouth 2 (two) times daily.   DULCOLAX 5 MG EC tablet Generic drug:  bisacodyl Take 5  mg by mouth at bedtime as needed for moderate constipation. Patient is taking 2 tabs.   HYDROcodone-acetaminophen 5-325 MG tablet Commonly known as:  NORCO/VICODIN Take 1 tablet by mouth 2 (two) times daily.   hydrocortisone 5 MG tablet Commonly known as:  CORTEF Take 1 tablet (5 mg total) by mouth daily.   montelukast 10 MG tablet Commonly known as:  SINGULAIR Take 1 tablet (10 mg total) by mouth at bedtime. Reported on 04/05/2015   multivitamin tablet Take 1 tablet by mouth daily.   Olopatadine HCl 0.2 % Soln Place 1 drop into both eyes daily.   ondansetron 4 MG tablet Commonly known as:  ZOFRAN Take 1 tablet by mouth every 8 (eight) hours as needed.   oxybutynin 15 MG 24 hr tablet Commonly known as:  DITROPAN XL Take one in the am and one in the pm   polyethylene glycol powder powder Commonly known as:  GLYCOLAX/MIRALAX 1 cap full in a full glass of water, two times a day for 3 days if you have not had a bowel movement in 3 days.   PROCTOSOL HC 2.5 % rectal cream Generic drug:  hydrocortisone   RA LAXATIVE & STOOL SOFTENER 8.6-50 MG tablet Generic drug:  senna-docusate Take 2 tablets by mouth 2 (two) times daily.   sertraline 100 MG tablet Commonly known as:  ZOLOFT Take 200 mg by mouth daily.   simvastatin 40 MG tablet Commonly known as:  ZOCOR Take 40 mg by mouth at bedtime.   Spacer/Aero Chamber Kohl'sMouthpiece Misc by Does not apply route.        DISCHARGE INSTRUCTIONS:   DIET:  Regular diet DISCHARGE CONDITION:  Good ACTIVITY:  Activity as tolerated OXYGEN:  Home Oxygen: No.  Oxygen Delivery: room air DISCHARGE LOCATION:  home   If you experience worsening of your admission symptoms, develop shortness of breath, life threatening emergency, suicidal or homicidal thoughts you must seek medical attention immediately by calling 911 or calling your MD immediately  if symptoms less severe.  You Must read complete instructions/literature along with all  the possible adverse reactions/side effects for all the Medicines you take and that have been prescribed to you. Take any new Medicines after you have completely understood and accpet all the possible adverse reactions/side effects.   Please note  You were cared for by a hospitalist during your hospital stay. If you have any questions about your discharge medications or the care you received while you were in the hospital after you are discharged, you can call the unit and asked to speak with the hospitalist on call if the hospitalist that took care of you is not available. Once you are discharged, your primary care physician will handle any further medical issues. Please note that NO REFILLS for any discharge medications will be  authorized once you are discharged, as it is imperative that you return to your primary care physician (or establish a relationship with a primary care physician if you do not have one) for your aftercare needs so that they can reassess your need for medications and monitor your lab values.    On the day of Discharge:  VITAL SIGNS:  Blood pressure 130/69, pulse 88, temperature 98.4 F (36.9 C), temperature source Axillary, resp. rate 18, height 5\' 7"  (1.702 m), weight 81.6 kg (180 lb), SpO2 91 %. PHYSICAL EXAMINATION:  GENERAL:  66 y.o.-year-old patient lying in the bed with no acute distress.  EYES: Pupils equal, round, reactive to light and accommodation. No scleral icterus. Extraocular muscles intact.  HEENT: Head atraumatic, normocephalic. Oropharynx and nasopharynx clear.  NECK:  Supple, no jugular venous distention. No thyroid enlargement, no tenderness.  LUNGS: Normal breath sounds bilaterally, no wheezing, rales,rhonchi or crepitation. No use of accessory muscles of respiration.  CARDIOVASCULAR: S1, S2 normal. No murmurs, rubs, or gallops.  ABDOMEN: Soft, non-tender, non-distended. Bowel sounds present. No organomegaly or mass.  EXTREMITIES: No pedal edema,  cyanosis, or clubbing.  NEUROLOGIC: Cranial nerves II through XII are intact. Muscle strength 5/5 in all extremities. Sensation intact. Gait not checked.  PSYCHIATRIC: The patient is alert and oriented x 3.  SKIN: No obvious rash, lesion, or ulcer.  DATA REVIEW:   CBC  Recent Labs Lab 09/09/16 0426  WBC 5.9  HGB 9.5*  HCT 27.7*  PLT 441*    Chemistries   Recent Labs Lab 09/06/16 1540  09/09/16 0426  NA 127*  < > 134*  K 3.3*  < > 3.8  CL 93*  < > 103  CO2 25  < > 26  GLUCOSE 78  < > 96  BUN <5*  < > <5*  CREATININE 0.63  < > 0.49  CALCIUM 8.7*  < > 8.2*  AST 49*  --   --   ALT 26  --   --   ALKPHOS 127*  --   --   BILITOT 0.4  --   --   < > = values in this interval not displayed.   Follow-up Information    Galen Manila, NP. Schedule an appointment as soon as possible for a visit on 09/11/2016.   Specialty:  Nurse Practitioner Why:  @ 2:20 pm Contact information: [address] [place] [address] [phone]           Management plans discussed with the patient, family and they are in agreement.  CODE STATUS: Prior   TOTAL TIME TAKING CARE OF THIS PATIENT: 45 minutes.    Delfino Lovett M.D on 09/10/2016 at 10:22 PM  Between 7am to 6pm - Pager - [phone]  After 6pm go to [email or web address] - password [email or web address] [facility]  [facility] [place] [facility]  Office  [phone]  CC: Primary care physician; Galen Manila, NP   Note: This dictation was prepared with Dragon dictation along with smaller phrase technology. Any transcriptional errors that result from this process are unintentional.

## 2016-09-10 NOTE — Telephone Encounter (Signed)
I have made the 1st attempt to contact the patient or family member in charge, in order to follow up from recently being discharged from the hospital. I was unable to leave a message on voicemail but I will make another attempt at a different time.  

## 2016-09-10 NOTE — Telephone Encounter (Signed)
I attempted to reach out to the patient and her emergency contact Celene Kraslaude, no answer. The pt no show for her hospital f/u appt today. The pt phone message states she is not available. The son(emergency contact) phone message states he is not taking incoming calls.

## 2016-09-10 NOTE — Telephone Encounter (Signed)
Pt called to say she was out of the hospital and she will be here tomorrow for Hospital Follow up appt.  She asked if someone would call [facility] to let them know she is home now.  Her call back number is [phone]

## 2016-09-11 ENCOUNTER — Other Ambulatory Visit: Payer: Self-pay | Admitting: *Deleted

## 2016-09-11 ENCOUNTER — Encounter: Payer: Self-pay | Admitting: Nurse Practitioner

## 2016-09-11 ENCOUNTER — Ambulatory Visit (INDEPENDENT_AMBULATORY_CARE_PROVIDER_SITE_OTHER): Payer: Medicare HMO | Admitting: Nurse Practitioner

## 2016-09-11 VITALS — BP 99/53 | HR 98 | Temp 98.2°F | Ht 67.0 in | Wt 182.0 lb

## 2016-09-11 DIAGNOSIS — Z79899 Other long term (current) drug therapy: Secondary | ICD-10-CM | POA: Diagnosis not present

## 2016-09-11 DIAGNOSIS — E871 Hypo-osmolality and hyponatremia: Secondary | ICD-10-CM | POA: Diagnosis not present

## 2016-09-11 DIAGNOSIS — K5901 Slow transit constipation: Secondary | ICD-10-CM | POA: Diagnosis not present

## 2016-09-11 DIAGNOSIS — E46 Unspecified protein-calorie malnutrition: Secondary | ICD-10-CM | POA: Diagnosis not present

## 2016-09-11 DIAGNOSIS — Z09 Encounter for follow-up examination after completed treatment for conditions other than malignant neoplasm: Secondary | ICD-10-CM

## 2016-09-11 DIAGNOSIS — E2740 Unspecified adrenocortical insufficiency: Secondary | ICD-10-CM

## 2016-09-11 LAB — CULTURE, BLOOD (ROUTINE X 2)
CULTURE: NO GROWTH
Culture: NO GROWTH
Special Requests: ADEQUATE
Special Requests: ADEQUATE

## 2016-09-11 MED ORDER — ALBUTEROL SULFATE HFA 108 (90 BASE) MCG/ACT IN AERS: 2.0000 | INHALATION_SPRAY | Freq: Four times a day (QID) | RESPIRATORY_TRACT | 3 refills | Status: DC | PRN

## 2016-09-11 MED ORDER — SENNOSIDES 8.6 MG PO TABS: 1.0000 | ORAL_TABLET | Freq: Every day | ORAL | 3 refills | Status: AC

## 2016-09-11 NOTE — Patient Instructions (Addendum)
Kelly Foster, Thank you for coming in to clinic today.  1. For your morning medicine: - Make sure you take these with food to prevent vomiting.  - UPDATE YOUR HOME MEDICINE LIST.  Make sure it is the same as the one in this packet.  Next visit, please bring all current medicine bottles.  2. For your nutrition: - Make sure you are eating 3 meals per day and at least 1 snack.  Make sure you are eating protein. - Limit your water to 4 bottles per day instead of 5.  3. Return to clinic tomorrow morning between 8am -11:30 for labs.  You can eat before coming.  Please schedule a follow-up appointment with Wilhelmina McardleLauren Luisalberto Beegle, AGNP. Return in about 4 weeks (around 10/09/2016) for hyponatremia, nutritional status.  If you have any other questions or concerns, please feel free to call the clinic or send a message through MyChart. You may also schedule an earlier appointment if necessary.  You will receive a survey after today's visit either digitally by e-mail or paper by [employer] mail. Your experiences and feedback matter to us.  Please respond so we know how we are doing as we provide care for you.   Wilhelmina McardleLauren Anjel Pardo, DNP, AGNP-BC Adult Gerontology Nurse Practitioner [facility], [facility]

## 2016-09-11 NOTE — Patient Outreach (Signed)
Unsuccessful telephone outreach to Adalberto ColePeggy Jean Bogus, 66 year old female - attempt to follow up on referral received 09/09/16 from [place] [facility] to follow pt with [facility] for transition of care/recent hospitalization August 4-7, 2018 for weakness, repeated falls (orthostatic hypotension).  Pt's history includes but not limited to Depression, Bipolar 1 disorder, Osteoarthritis of hip, Hypothyroidism, Hyperglycemia.    Unable to leave a voice message with pt, message on phone- voice message not set up.    Plan:  RN CM to try again within next 24 hrs.    Shayne Alkenose M.   Pierzchala RN CCM [facility] Care [facility]  [phone]

## 2016-09-11 NOTE — Progress Notes (Signed)
Subjective:    Patient ID: Kelly Foster, female    DOB: 01/16/1951, 66 y.o.   MRN: [id number]  Kelly Foster is a 66 y.o. female presenting on 09/11/2016 for Hospitalization Follow-up (pt been falling a lot.)   HPI HOSPITAL FOLLOW-UP VISIT Hospital/Location: [facility] Date of Admission: 09/06/2016 Date of Discharge: 09/09/2016 Transitions of care telephone call: unable to be completed  Reason for Admission: Falls and weakness, hyponatremia Primary (+Secondary) Diagnosis: orthostatic hypotension, hyponatremia, weakness (bipolar disorder, chronic adrenal insufficiency, hypokalemia, involuntary movements)  - Hospital H&P and Discharge Summary have been reviewed - Patient presents today 3 days after recent hospitalization for falls, weakness daily and was found to have hyponatremia.  - Today reports overall has well after discharge with eating better and feeling a little stronger. She reports no additional falls.  - New medications on discharge: hydrocortisone 5 mg once daily - Changes to current meds on discharge: stop amitriptyline  Notes she took her medicines w/o food and vomited them all up this morning.  Has not had incidence of this since before going to hospital.  Has had at least 2 meals daily.  Meals on Wheels for lunch and son has been cooking her dinner. Breakfast is usually toast and jam.  Uses protein powder in her smoothies and drinks about 2 per day.  Pt has had consults during hospitalization to evaluate involuntary movements consistent w/ tardive dyskinesia  Notes she has had these movements for about 6 years.  Psychiatry not certain that movements are from medications.  Stopped amitriptyline.  No improvement in last 3 days.  Social History  Substance Use Topics  . Smoking status: Former Smoker    Packs/day: 1.00    Types: Cigarettes    Quit date: 03/01/1995  . Smokeless tobacco: Never Used     Comment: quit 1998  . Alcohol use No    Review of Systems Per HPI  unless specifically indicated above  Outpatient Encounter Prescriptions as of 09/11/2016  Medication Sig  . acyclovir (ZOVIRAX) 400 MG tablet Take 400 mg by mouth 3 (three) times daily as needed (for flares).  . ARTIFICIAL TEARS 0.1-0.3 % SOLN Administer 1 drop to both eyes 4 (four) times a day as needed.  Marland Kitchen. aspirin 81 MG tablet Take 1 tablet (81 mg total) by mouth daily.  . bisacodyl (DULCOLAX) 5 MG EC tablet Take 5 mg by mouth at bedtime as needed for moderate constipation. Patient is taking 2 tabs.  . Calcium Carbonate-Vitamin D3 (CALCIUM 600-D) 600-400 MG-UNIT TABS Take 1 tablet by mouth daily.  . divalproex (DEPAKOTE) 500 MG DR tablet Take 500 mg by mouth 2 (two) times daily.  Marland Kitchen. HYDROcodone-acetaminophen (NORCO/VICODIN) 5-325 MG tablet Take 1 tablet by mouth 2 (two) times daily.  . hydrocortisone (CORTEF) 5 MG tablet Take 1 tablet (5 mg total) by mouth daily.  . montelukast (SINGULAIR) 10 MG tablet Take 1 tablet (10 mg total) by mouth at bedtime. Reported on 04/05/2015  . Multiple Vitamin (MULTIVITAMIN) tablet Take 1 tablet by mouth daily.  . Olopatadine HCl 0.2 % SOLN Place 1 drop into both eyes daily.  . ondansetron (ZOFRAN) 4 MG tablet Take 1 tablet by mouth every 8 (eight) hours as needed.  Marland Kitchen. oxybutynin (DITROPAN XL) 15 MG 24 hr tablet Take one in the am and one in the pm  . polyethylene glycol powder (GLYCOLAX/MIRALAX) powder 1 cap full in a full glass of water, two times a day for 3 days if you have not had  a bowel movement in 3 days.  Marland Kitchen sertraline (ZOLOFT) 100 MG tablet Take 200 mg by mouth daily.   . simvastatin (ZOCOR) 40 MG tablet Take 40 mg by mouth at bedtime.   Marland Kitchen albuterol (PROVENTIL HFA;VENTOLIN HFA) 108 (90 Base) MCG/ACT inhaler Inhale 2 puffs into the lungs every 6 (six) hours as needed for wheezing or shortness of breath. (Patient not taking: Reported on 09/11/2016)  . hydrocortisone (PROCTOSOL HC) 2.5 % rectal cream   . RA LAXATIVE & STOOL SOFTENER 8.6-50 MG tablet Take 2  tablets by mouth 2 (two) times daily.  Marland Kitchen Spacer/Aero Chamber Mouthpiece MISC by Does not apply route.   No facility-administered encounter medications on file as of 09/11/2016.       Objective:    BP 134/69 (BP Location: Right Arm, Patient Position: Sitting, Cuff Size: Normal)   Pulse 99   Temp 98.2 F (36.8 C) (Oral)   Ht 5\' 7"  (1.702 m)   Wt 182 lb (82.6 kg)   BMI 28.51 kg/m   Wt Readings from Last 3 Encounters:  09/11/16 182 lb (82.6 kg)  09/06/16 180 lb (81.6 kg)  08/30/16 180 lb 9.6 oz (81.9 kg)    Physical Exam General - healthy, well-appearing, NAD HEENT - Normocephalic, atraumatic, PERRL, EOMI, patent nares w/o congestion, oropharynx clear, MMM Neck - supple, non-tender, no LAD Heart - RRR, no murmurs heard Lungs - Clear throughout all lobes, no wheezing, crackles, or rhonchi. Normal work of breathing. Abdomen - soft, NTND, no masses, no hepatosplenomegaly, active bowel sounds Extremeties - non-tender, no edema, cap refill < 2 seconds, peripheral pulses intact +2 bilaterally Skin - warm, dry, no rashes Neuro - awake, alert, oriented x3, intact muscle strength 4/5 bilaterally upper and lower extremities, intact distal sensation to light touch, normal coordination, shuffling gait, Involuntary movements consistent w/ tardive dyskinesia   Psych - Normal mood and affect, normal behavior, delayed responses.  Results for orders placed or performed during the hospital encounter of 09/06/16  Blood Culture (routine x 2)  Result Value Ref Range   Specimen Description BLOOD LEFT ANTECUBITAL    Special Requests      BOTTLES DRAWN AEROBIC AND ANAEROBIC Blood Culture adequate volume   Culture NO GROWTH 5 DAYS    Report Status 09/11/2016 FINAL   Blood Culture (routine x 2)  Result Value Ref Range   Specimen Description BLOOD RIGHT ANTECUBITAL    Special Requests      BOTTLES DRAWN AEROBIC AND ANAEROBIC Blood Culture adequate volume   Culture NO GROWTH 5 DAYS    Report Status  09/11/2016 FINAL   Urine culture  Result Value Ref Range   Specimen Description URINE, RANDOM    Special Requests NONE    Culture 20,000 COLONIES/mL ENTEROBACTER SPECIES (A)    Report Status 09/08/2016 FINAL    Organism ID, Bacteria ENTEROBACTER SPECIES (A)       Susceptibility   Enterobacter species - MIC*    CEFAZOLIN >=64 RESISTANT Resistant     CEFTRIAXONE 32 INTERMEDIATE Intermediate     CIPROFLOXACIN <=0.25 SENSITIVE Sensitive     GENTAMICIN <=1 SENSITIVE Sensitive     IMIPENEM 1 SENSITIVE Sensitive     NITROFURANTOIN 64 INTERMEDIATE Intermediate     TRIMETH/SULFA <=20 SENSITIVE Sensitive     PIP/TAZO >=128 RESISTANT Resistant     * 20,000 COLONIES/mL ENTEROBACTER SPECIES  Lactic acid, plasma  Result Value Ref Range   Lactic Acid, Venous 0.6 0.5 - 1.9 mmol/L  Comprehensive metabolic panel  Result Value Ref Range   Sodium 127 (L) 135 - 145 mmol/L   Potassium 3.3 (L) 3.5 - 5.1 mmol/L   Chloride 93 (L) 101 - 111 mmol/L   CO2 25 22 - 32 mmol/L   Glucose, Bld 78 65 - 99 mg/dL   BUN <5 (L) 6 - 20 mg/dL   Creatinine, Ser 4.09 0.44 - 1.00 mg/dL   Calcium 8.7 (L) 8.9 - 10.3 mg/dL   Total Protein 6.5 6.5 - 8.1 g/dL   Albumin 2.5 (L) 3.5 - 5.0 g/dL   AST 49 (H) 15 - 41 U/L   ALT 26 14 - 54 U/L   Alkaline Phosphatase 127 (H) 38 - 126 U/L   Total Bilirubin 0.4 0.3 - 1.2 mg/dL   GFR calc non Af Amer >60 >60 mL/min   GFR calc Af Amer >60 >60 mL/min   Anion gap 9 5 - 15  Troponin I  Result Value Ref Range   Troponin I <0.03 <0.03 ng/mL  CBC WITH DIFFERENTIAL  Result Value Ref Range   WBC 3.7 3.6 - 11.0 K/uL   RBC 3.81 3.80 - 5.20 MIL/uL   Hemoglobin 11.5 (L) 12.0 - 16.0 g/dL   HCT 81.1 (L) 91.4 - 78.2 %   MCV 89.2 80.0 - 100.0 fL   MCH 30.2 26.0 - 34.0 pg   MCHC 33.9 32.0 - 36.0 g/dL   RDW 95.6 (H) 21.3 - 08.6 %   Platelets 434 150 - 440 K/uL   Neutrophils Relative % 57 %   Neutro Abs 2.1 1.4 - 6.5 K/uL   Lymphocytes Relative 29 %   Lymphs Abs 1.1 1.0 - 3.6 K/uL    Monocytes Relative 13 %   Monocytes Absolute 0.5 0.2 - 0.9 K/uL   Eosinophils Relative 1 %   Eosinophils Absolute 0.0 0 - 0.7 K/uL   Basophils Relative 0 %   Basophils Absolute 0.0 0 - 0.1 K/uL  Blood gas, venous ([facility], [facility], [facility])  Result Value Ref Range   pH, Ven 7.42 7.250 - 7.430   pCO2, Ven 41 (L) 44.0 - 60.0 mmHg   pO2, Ven <31.0 (LL) 32.0 - 45.0 mmHg   Bicarbonate 26.6 20.0 - 28.0 mmol/L   Acid-Base Excess 1.9 0.0 - 2.0 mmol/L   Patient temperature 37.0    Collection site VEIN    Sample type VENOUS   Urinalysis, Complete w Microscopic  Result Value Ref Range   Color, Urine STRAW (A) YELLOW   APPearance CLEAR (A) CLEAR   Specific Gravity, Urine 1.004 (L) 1.005 - 1.030   pH 7.0 5.0 - 8.0   Glucose, UA NEGATIVE NEGATIVE mg/dL   Hgb urine dipstick NEGATIVE NEGATIVE   Bilirubin Urine NEGATIVE NEGATIVE   Ketones, ur 5 (A) NEGATIVE mg/dL   Protein, ur NEGATIVE NEGATIVE mg/dL   Nitrite NEGATIVE NEGATIVE   Leukocytes, UA NEGATIVE NEGATIVE   RBC / HPF NONE SEEN 0 - 5 RBC/hpf   WBC, UA 0-5 0 - 5 WBC/hpf   Bacteria, UA NONE SEEN NONE SEEN   Squamous Epithelial / LPF 0-5 (A) NONE SEEN  Valproic acid level  Result Value Ref Range   Valproic Acid Lvl 45 (L) 50.0 - 100.0 ug/mL  Cortisol  Result Value Ref Range   Cortisol, Plasma 15.0 ug/dL  TSH  Result Value Ref Range   TSH 1.032 0.350 - 4.500 uIU/mL  Basic metabolic panel  Result Value Ref Range   Sodium 132 (L) 135 - 145 mmol/L   Potassium  3.4 (L) 3.5 - 5.1 mmol/L   Chloride 101 101 - 111 mmol/L   CO2 26 22 - 32 mmol/L   Glucose, Bld 91 65 - 99 mg/dL   BUN <5 (L) 6 - 20 mg/dL   Creatinine, Ser 1.61 0.44 - 1.00 mg/dL   Calcium 8.0 (L) 8.9 - 10.3 mg/dL   GFR calc non Af Amer >60 >60 mL/min   GFR calc Af Amer >60 >60 mL/min   Anion gap 5 5 - 15  Basic metabolic panel  Result Value Ref Range   Sodium 129 (L) 135 - 145 mmol/L   Potassium 3.6 3.5 - 5.1 mmol/L   Chloride 97 (L) 101 - 111 mmol/L   CO2 27 22 - 32 mmol/L    Glucose, Bld 98 65 - 99 mg/dL   BUN <5 (L) 6 - 20 mg/dL   Creatinine, Ser 0.96 0.44 - 1.00 mg/dL   Calcium 8.2 (L) 8.9 - 10.3 mg/dL   GFR calc non Af Amer >60 >60 mL/min   GFR calc Af Amer >60 >60 mL/min   Anion gap 5 5 - 15  CBC  Result Value Ref Range   WBC 5.9 3.6 - 11.0 K/uL   RBC 3.12 (L) 3.80 - 5.20 MIL/uL   Hemoglobin 9.5 (L) 12.0 - 16.0 g/dL   HCT 04.5 (L) 40.9 - 81.1 %   MCV 88.6 80.0 - 100.0 fL   MCH 30.3 26.0 - 34.0 pg   MCHC 34.2 32.0 - 36.0 g/dL   RDW 91.4 (H) 78.2 - 95.6 %   Platelets 441 (H) 150 - 440 K/uL  Basic metabolic panel  Result Value Ref Range   Sodium 134 (L) 135 - 145 mmol/L   Potassium 3.8 3.5 - 5.1 mmol/L   Chloride 103 101 - 111 mmol/L   CO2 26 22 - 32 mmol/L   Glucose, Bld 96 65 - 99 mg/dL   BUN <5 (L) 6 - 20 mg/dL   Creatinine, Ser 2.13 0.44 - 1.00 mg/dL   Calcium 8.2 (L) 8.9 - 10.3 mg/dL   GFR calc non Af Amer >60 >60 mL/min   GFR calc Af Amer >60 >60 mL/min   Anion gap 5 5 - 15      Assessment & Plan:   Problem List Items Addressed This Visit      Digestive   Slow transit constipation    Pt taking dulcolax daily for constipation. Non-preferred for long-term use.  Pt not taking any other agent for bowel motility, bulk forming, or stool softener.  Plan: 1. Prefer stool softener as agent for constipation.  Pt states it worked well.  2. STOP dulcolax 3. START using senokot once daily. 4. Drink enough water, but not too much. Limit to 4 bottles daily(about 2400 mL). 5. Follow up as needed.      Relevant Medications   senna (SENOKOT) 8.6 MG tablet     Endocrine   Chronic adrenal insufficiency (HCC)    Pt w/ chronic adrenal insufficiency and was previously on hydrocortisone.  She had been taken off this medication prior to initiating care with me about 4 months ago.  Plan: 1. Hydrocortisone 5 mg once daily initiated in hospital.  Continue for now. 2. Referral to endocrinology placed for follow up and ongoing management of adrenal  insufficiency. 3. Recheck CMP tomorrow since no phlebotomy today. 4. Follow up 1 month.      Relevant Orders   Ambulatory referral to Endocrinology     Other  Protein malnutrition (HCC)    Pt w/ hypoalbuminemia continuing and contributing to hospitalization and weakness.  Plan: 1. Continue protein supplementation, increasing to full dose each day.  Eat regular meals. 2. Follow up 4 weeks.       Hyponatremia    Pt w/ hyponatremia w/ normalization at end of hospitalization.  Pt w/ improved symptoms.  Plan: 1. Recheck CMP today evaluate current sodium level and protein. 2. Recheck depakote level also ensure adequate dosing.  Pt notes she has changed to one tablet bid instead of two tablets at night. 3. Follow up 4 weeks.      Relevant Orders   Comprehensive metabolic panel    Other Visit Diagnoses    Hospital discharge follow-up    -  Primary Pt stable since discharge.  Needs endocrinology referral per recommendations after hospitalization.  Plan: 1. Resume [facility].  Add PT to nursing for falls/balance/gait training.   2. Recheck CMP, depakote level.    High risk medications (not anticoagulants) long-term use     Pt on depakote as is managed by Dr. Cherylann Ratel at [facility].  Plan: 1. Recheck depakote level ensure adequate dosing.   Relevant Orders   Comprehensive metabolic panel   Valproic Acid level      Meds ordered this encounter  Medications  . albuterol (PROVENTIL HFA;VENTOLIN HFA) 108 (90 Base) MCG/ACT inhaler    Sig: Inhale 2 puffs into the lungs every 6 (six) hours as needed for wheezing or shortness of breath.    Dispense:  1 Inhaler    Refill:  3    Order Specific Question:   Supervising Provider    Answer:   Smitty Cords [[id number]]  . senna (SENOKOT) 8.6 MG tablet    Sig: Take 1 tablet (8.6 mg total) by mouth daily.    Dispense:  90 tablet    Refill:  3    Order Specific Question:   Supervising Provider    Answer:    Smitty Cords [[id number]]     I have reviewed the discharge medication list, and have reconciled the current and discharge medications today.  Follow up plan: Return in about 4 weeks (around 10/09/2016) for hyponatremia, nutritional status.   Wilhelmina Mcardle, DNP, AGPCNP-BC Adult Gerontology Primary Care Nurse Practitioner [facility] 09/11/2016, 2:43 PM

## 2016-09-12 ENCOUNTER — Telehealth: Payer: Self-pay

## 2016-09-12 ENCOUNTER — Other Ambulatory Visit: Payer: Self-pay | Admitting: *Deleted

## 2016-09-12 DIAGNOSIS — Z87891 Personal history of nicotine dependence: Secondary | ICD-10-CM | POA: Diagnosis not present

## 2016-09-12 DIAGNOSIS — A6009 Herpesviral infection of other urogenital tract: Secondary | ICD-10-CM | POA: Diagnosis not present

## 2016-09-12 DIAGNOSIS — E441 Mild protein-calorie malnutrition: Secondary | ICD-10-CM | POA: Diagnosis not present

## 2016-09-12 DIAGNOSIS — K120 Recurrent oral aphthae: Secondary | ICD-10-CM | POA: Diagnosis not present

## 2016-09-12 DIAGNOSIS — K5901 Slow transit constipation: Secondary | ICD-10-CM | POA: Insufficient documentation

## 2016-09-12 DIAGNOSIS — J302 Other seasonal allergic rhinitis: Secondary | ICD-10-CM | POA: Diagnosis not present

## 2016-09-12 DIAGNOSIS — G8929 Other chronic pain: Secondary | ICD-10-CM | POA: Diagnosis not present

## 2016-09-12 HISTORY — DX: Slow transit constipation: K59.01

## 2016-09-12 NOTE — Patient Outreach (Signed)
Second unsuccessful telephone outreach to Adalberto ColePeggy Jean Rhett, 66 year old female, follow up on referral received 09/09/16 from [place] [facility] for Community CM services/transition of care /recent hospitalization August 4-7,2018 for weakness,repeated falls (orthostatic hypotension).  Pt's history includes but not limited to Bipolar 1 disorder, depression,Osteoarthritis of hip, Hypothyroidism, Hyperglycemia.   Unable to leave a voice message as message on phone- voice message not set up.    Plan:  RN CM to made a third attempt call next week/next business day.    Shayne Alkenose M.   Krysteena Stalker RN CCM [facility]  [phone]

## 2016-09-12 NOTE — Assessment & Plan Note (Addendum)
Pt w/ hyponatremia w/ normalization at end of hospitalization.  Pt w/ improved symptoms.  Plan: 1. Recheck CMP today evaluate current sodium level and protein. 2. Recheck depakote level also ensure adequate dosing.  Pt notes she has changed to one tablet bid instead of two tablets at night. 3. Follow up 4 weeks.

## 2016-09-12 NOTE — Assessment & Plan Note (Signed)
Pt taking dulcolax daily for constipation. Non-preferred for long-term use.  Pt not taking any other agent for bowel motility, bulk forming, or stool softener.  Plan: 1. Prefer stool softener as agent for constipation.  Pt states it worked well.  2. STOP dulcolax 3. START using senokot once daily. 4. Drink enough water, but not too much. Limit to 4 bottles daily(about 2400 mL). 5. Follow up as needed.

## 2016-09-12 NOTE — Progress Notes (Signed)
I have reviewed this encounter including the documentation in this note and/or discussed this patient with the provider, Wilhelmina McardleLauren Kennedy, AGPCNP-BC. I am certifying that I agree with the content of this note as supervising physician.  Saralyn PilarAlexander Karamalegos, DO [facility] 09/12/2016, 12:28 PM

## 2016-09-12 NOTE — Assessment & Plan Note (Signed)
Pt w/ chronic adrenal insufficiency and was previously on hydrocortisone.  She had been taken off this medication prior to initiating care with me about 4 months ago.  Plan: 1. Hydrocortisone 5 mg once daily initiated in hospital.  Continue for now. 2. Referral to endocrinology placed for follow up and ongoing management of adrenal insufficiency. 3. Recheck CMP tomorrow since no phlebotomy today. 4. Follow up 1 month.

## 2016-09-12 NOTE — Assessment & Plan Note (Signed)
Pt w/ hypoalbuminemia continuing and contributing to hospitalization and weakness.  Plan: 1. Continue protein supplementation, increasing to full dose each day.  Eat regular meals. 2. Follow up 4 weeks.

## 2016-09-12 NOTE — Telephone Encounter (Signed)
Verdon CumminsJesse called with some concerns about the patient cognitive impairment. He is noticing problems with memory, thinking and judgment. He states as of right now the patient son and girlfriend is staying with her, but they want be there long-term. He is requesting PCS paperwork  to be filled out to get her some assistance with supervision. He also feel like the patient is showing early signs of dementia.   He reconcile the patients medications on today and notice she is missing OTC: ASA, Calcium-Vit D3, Miralex, and Senna. He said he informed the son that he can purchase them over the counter.  She is also missing Acyclovir, Olopatadine eye drops, Zofran, Oxybutnin, and Simvastatin.

## 2016-09-15 ENCOUNTER — Encounter: Payer: Self-pay | Admitting: *Deleted

## 2016-09-15 ENCOUNTER — Telehealth: Payer: Self-pay | Admitting: Nurse Practitioner

## 2016-09-15 ENCOUNTER — Other Ambulatory Visit: Payer: Self-pay | Admitting: *Deleted

## 2016-09-15 DIAGNOSIS — G8929 Other chronic pain: Secondary | ICD-10-CM | POA: Diagnosis not present

## 2016-09-15 DIAGNOSIS — A6009 Herpesviral infection of other urogenital tract: Secondary | ICD-10-CM | POA: Diagnosis not present

## 2016-09-15 DIAGNOSIS — K120 Recurrent oral aphthae: Secondary | ICD-10-CM | POA: Diagnosis not present

## 2016-09-15 DIAGNOSIS — J302 Other seasonal allergic rhinitis: Secondary | ICD-10-CM | POA: Diagnosis not present

## 2016-09-15 DIAGNOSIS — E441 Mild protein-calorie malnutrition: Secondary | ICD-10-CM | POA: Diagnosis not present

## 2016-09-15 DIAGNOSIS — Z87891 Personal history of nicotine dependence: Secondary | ICD-10-CM | POA: Diagnosis not present

## 2016-09-15 MED ORDER — SIMVASTATIN 40 MG PO TABS
40.0000 mg | ORAL_TABLET | Freq: Every day | ORAL | 5 refills | Status: DC
Start: 2016-09-15 — End: 2016-10-10

## 2016-09-15 MED ORDER — ACYCLOVIR 400 MG PO TABS: 400.0000 mg | ORAL_TABLET | Freq: Three times a day (TID) | ORAL | 2 refills | Status: DC | PRN

## 2016-09-15 NOTE — Telephone Encounter (Signed)
The pt was notified, no questions or concerns. 

## 2016-09-15 NOTE — Addendum Note (Signed)
Addended by: Wilhelmina McardleKENNEDY, Traniya Prichett R on: 09/15/2016 12:24 PM   Modules accepted: Orders

## 2016-09-15 NOTE — Patient Outreach (Signed)
Unsuccessful telephone encounter/third attempt call  to Kelly Foster, 66 year old female, follow up on referral received 09/09/16 from [place] RN [facility] for community CM services/transition of care/recent hospitalization [place] 4-7,2018 for weakness,repeated falls(orthostatic hypotension).   Unable to leave a voice message as message on phone- voice message not set up.     Plan:  With 3  unsuccessful attempt calls, unable to contact letter to be sent.   If no response to letter in 10 business days, plan to close case/notify MD.     Shayne Alkenose M.   Teondre Jarosz RN CCM [facility]  [phone]

## 2016-09-15 NOTE — Telephone Encounter (Signed)
See telephone encounter 8/10: - Recommend compression socks, elevation of lower extremities. - Need CMP to evaluate sodium and albumin.

## 2016-09-15 NOTE — Telephone Encounter (Addendum)
We are going to discontinue zofran and oxybutynin for symptoms of falls.  These are not medications she must continue.  Also, please remind Kelly Foster, pt and family they can call pharmacy for refills.  Many had refills remaining.  For pt's recent leg swelling: She needs to have CMP drawn to re-evaluate sodium level and protein.  Please have [facility] draw it w/ her next home visit or [facility].  Only need one collection.  For now, please wear compression socks.  We can call a prescription to medical supplies store if needed.  If you call Kelly Foster, please tell her [facility] trying to contact her.  She is welcome to call [name] directly at [phone].  This is important to help her get the resources that she needs.

## 2016-09-15 NOTE — Telephone Encounter (Signed)
Attempted to contact the pt, no answer. VM not set-up.  

## 2016-09-15 NOTE — Telephone Encounter (Signed)
Pt's feet are swollen and she can't get her shoes on.  Please call [phone]

## 2016-09-15 NOTE — Telephone Encounter (Signed)
Bilateral feet swelling x 1 day. Pt states she woke up yesterday and both feet was swollen. She cannot get her shoes on today. No SOB, erythema or pain.  I instructed the pt to elevated her feet to help with the swelling.

## 2016-09-15 NOTE — Patient Outreach (Signed)
Received a call from pt, HIPAA identifiers verified, reports MD office called her about  RN CM trying to contact her.   RN CM discussed with pt several attempts made, reason for calls follow up on [facility] for community CM services/transition of care/recent hospitalization August 4-7,2018 for weakness,repeated falls (orthostatic hypertension).  Pt reports reason for being weak, dizzy coming from  [facility], doing better.  RN CM discussed with pt signs/symptoms to report to MD to which pt voiced understanding.   Pt reports saw PCP Kelly Foster(Kelly Foster) last week, still has a lot of swelling in left ankle/received a call today from MD office to wear support hose/elevate leg.   Pt reports HH RN Kelly Foster saw her last week.  Pt reports son and his girlfriend manage her medications, gave permission to speak to Kelly Foster (son's girlfriend) as well as Kelly Foster (son).  Medication review started with Kelly Foster and son completed- review showed pt still needs Aspirin 81 mg and Calcium with Vitamin D, currently not taking Oxybutynin- needs a refill.    Son reports he is given pt her medications, told pt not able to do.  **Patient was recently discharged from hospital and all medications have been reviewed.   RN CM discussed with pt [facility] transition of care program- follow 31 days(weekly calls, a home visit), RN CM's contact name and number provided- call if needed.      Plan:  As discussed with pt/son's girlfriend in the background- plan to follow up again next week- initial home visit.             Barrier letter sent to Kelly Foster- informing of [facility] involvement.   Kelly Foster M.   Pierzchala RN CCM [facility] Care [facility]  [phone]

## 2016-09-16 ENCOUNTER — Other Ambulatory Visit: Payer: Self-pay

## 2016-09-16 ENCOUNTER — Telehealth: Payer: Self-pay | Admitting: Nurse Practitioner

## 2016-09-16 ENCOUNTER — Encounter: Payer: Self-pay | Admitting: Nurse Practitioner

## 2016-09-16 DIAGNOSIS — R4189 Other symptoms and signs involving cognitive functions and awareness: Secondary | ICD-10-CM | POA: Insufficient documentation

## 2016-09-16 HISTORY — DX: Other symptoms and signs involving cognitive functions and awareness: R41.89

## 2016-09-16 NOTE — Telephone Encounter (Signed)
Lem, Physical Therapist with [facility] needs a verbal to see pt twice this week for exercises to improve pt's balance.  She has fallen twice this week and has a decline in functional mobility.  He will be calling back next week for additional visits.  His call back number is [phone]

## 2016-09-16 NOTE — Telephone Encounter (Signed)
Verbal order was provided for PT 2x this week. May need 3x per week visits if covered in future.

## 2016-09-17 ENCOUNTER — Telehealth: Payer: Self-pay | Admitting: Nurse Practitioner

## 2016-09-17 NOTE — Telephone Encounter (Signed)
Pt has had upset stomach for about a week and she thinks it's the medication.  Her call back number is [phone]

## 2016-09-17 NOTE — Telephone Encounter (Signed)
The pt states she have not had a bowel movement since she left the hospital 7 days ago. Please advise

## 2016-09-17 NOTE — Telephone Encounter (Signed)
I spoke w/ the pt and per Dr. Kirtland BouchardK recommendation I informed the pt to use a rectal suppository. I also informed her to continue to drink plenty of water. She will call back tomorrow to update us on the status. I told the pt if she start to develop severe pain that she needs to go to the ER. The pt verbalize understanding.

## 2016-09-18 NOTE — Telephone Encounter (Signed)
Reviewed and agree.  Dulcolax was changed at last visit because pt was taking daily.  She can take dulcolax one tablet again today and tomorrow if she is still unable to have BM.  Did she actually start the senna or Miralax?  I want her on something more gentle long term.  Also, don't want her drinking too much water because of hyponatremia (only 4-5 bottles per day).

## 2016-09-18 NOTE — Telephone Encounter (Signed)
Attempted to contact the pt, no answer. VM not set-up.  

## 2016-09-18 NOTE — Telephone Encounter (Signed)
The pt was notified. No questions or concerns. 

## 2016-09-19 ENCOUNTER — Other Ambulatory Visit: Payer: Self-pay | Admitting: Nurse Practitioner

## 2016-09-19 DIAGNOSIS — J302 Other seasonal allergic rhinitis: Secondary | ICD-10-CM | POA: Diagnosis not present

## 2016-09-19 DIAGNOSIS — G8929 Other chronic pain: Secondary | ICD-10-CM | POA: Diagnosis not present

## 2016-09-19 DIAGNOSIS — E441 Mild protein-calorie malnutrition: Secondary | ICD-10-CM | POA: Diagnosis not present

## 2016-09-19 DIAGNOSIS — Z87891 Personal history of nicotine dependence: Secondary | ICD-10-CM | POA: Diagnosis not present

## 2016-09-19 DIAGNOSIS — H1045 Other chronic allergic conjunctivitis: Secondary | ICD-10-CM

## 2016-09-19 DIAGNOSIS — K120 Recurrent oral aphthae: Secondary | ICD-10-CM | POA: Diagnosis not present

## 2016-09-19 DIAGNOSIS — A6009 Herpesviral infection of other urogenital tract: Secondary | ICD-10-CM | POA: Diagnosis not present

## 2016-09-19 MED ORDER — MONTELUKAST SODIUM 10 MG PO TABS: 10.0000 mg | ORAL_TABLET | Freq: Every day | ORAL | 11 refills | Status: DC

## 2016-09-19 MED ORDER — BISACODYL 5 MG PO TBEC: 5.0000 mg | DELAYED_RELEASE_TABLET | Freq: Every day | ORAL | 0 refills | Status: DC | PRN

## 2016-09-19 NOTE — Progress Notes (Signed)
Aspirin, Calcium w/ vit d not in home  Dr. Cherylann Ratel - Clarified depakote dose and pt is to take 500 mg ER two tabs at bedtime.  Out of monelukast -  refill placed.  Son is administering meds and seems to be improving.  Bilateral edema - new and improving w/ elevating legs and compression socks. Pain 9/10 in left foot.  More edema than R - nonpitting  Vomited 3x today.  At next home visit on Tuesday: CMP, depakote level to be drawn.  End of medicare episode - needs recert and will send order.  Discussed all above information w/ Verdon Cummins, RN w/ [facility].

## 2016-09-20 DIAGNOSIS — E441 Mild protein-calorie malnutrition: Secondary | ICD-10-CM | POA: Diagnosis not present

## 2016-09-20 DIAGNOSIS — K120 Recurrent oral aphthae: Secondary | ICD-10-CM | POA: Diagnosis not present

## 2016-09-20 DIAGNOSIS — J302 Other seasonal allergic rhinitis: Secondary | ICD-10-CM | POA: Diagnosis not present

## 2016-09-20 DIAGNOSIS — G8929 Other chronic pain: Secondary | ICD-10-CM | POA: Diagnosis not present

## 2016-09-20 DIAGNOSIS — Z87891 Personal history of nicotine dependence: Secondary | ICD-10-CM | POA: Diagnosis not present

## 2016-09-20 DIAGNOSIS — A6009 Herpesviral infection of other urogenital tract: Secondary | ICD-10-CM | POA: Diagnosis not present

## 2016-09-22 ENCOUNTER — Telehealth: Payer: Self-pay

## 2016-09-22 NOTE — Telephone Encounter (Signed)
Clayton Lefort called from [place] requesting the a okay to continue PT for a total of 8 weeks. Please advise.

## 2016-09-22 NOTE — Telephone Encounter (Signed)
Continue PT x 8 weeks

## 2016-09-23 ENCOUNTER — Encounter: Payer: Self-pay | Admitting: *Deleted

## 2016-09-23 ENCOUNTER — Other Ambulatory Visit: Payer: Self-pay

## 2016-09-23 ENCOUNTER — Telehealth: Payer: Self-pay | Admitting: Nurse Practitioner

## 2016-09-23 ENCOUNTER — Other Ambulatory Visit: Payer: Self-pay | Admitting: *Deleted

## 2016-09-23 DIAGNOSIS — E870 Hyperosmolality and hypernatremia: Secondary | ICD-10-CM | POA: Diagnosis not present

## 2016-09-23 DIAGNOSIS — J301 Allergic rhinitis due to pollen: Secondary | ICD-10-CM

## 2016-09-23 DIAGNOSIS — I10 Essential (primary) hypertension: Secondary | ICD-10-CM | POA: Diagnosis not present

## 2016-09-23 DIAGNOSIS — H1045 Other chronic allergic conjunctivitis: Secondary | ICD-10-CM

## 2016-09-23 DIAGNOSIS — R627 Adult failure to thrive: Secondary | ICD-10-CM | POA: Diagnosis not present

## 2016-09-23 DIAGNOSIS — E441 Mild protein-calorie malnutrition: Secondary | ICD-10-CM | POA: Diagnosis not present

## 2016-09-23 DIAGNOSIS — E871 Hypo-osmolality and hyponatremia: Secondary | ICD-10-CM | POA: Diagnosis not present

## 2016-09-23 DIAGNOSIS — G8929 Other chronic pain: Secondary | ICD-10-CM | POA: Diagnosis not present

## 2016-09-23 DIAGNOSIS — I951 Orthostatic hypotension: Secondary | ICD-10-CM | POA: Diagnosis not present

## 2016-09-23 MED ORDER — MONTELUKAST SODIUM 10 MG PO TABS: 10.0000 mg | ORAL_TABLET | Freq: Every day | ORAL | 11 refills | Status: AC

## 2016-09-23 MED ORDER — MOMETASONE FUROATE 50 MCG/ACT NA SUSP: 2.0000 | Freq: Every day | NASAL | 12 refills | Status: AC

## 2016-09-23 MED ORDER — MOMETASONE FUROATE 50 MCG/ACT NA SUSP: 2.0000 | Freq: Every day | NASAL | 12 refills | Status: DC

## 2016-09-23 MED ORDER — MONTELUKAST SODIUM 10 MG PO TABS: 10.0000 mg | ORAL_TABLET | Freq: Every day | ORAL | 11 refills | Status: DC

## 2016-09-23 NOTE — Patient Outreach (Signed)
[facility]) Care Management   09/23/2016  Aryahi Snitker 30-May-1950 [id number]  Felisa Tamzyn Vanausdal is an 66 y.o. female  Subjective:  Pt reports weak,tired from recent hospitalization, dealing with sores in her mouth,to  Follow up with oral surgeon 10/13/16.  Pt reports bowels are working with use of Senokot.   Pt  Reports support stockings hurt with use, informed MD who encouraged pt to wear as much  As she can.   Pt reports HH PT working with her, hx of falls.   Objective:  Vitals:   09/23/16 1303 09/23/16 1411  BP:  90/70  Pulse:  88  Resp: 12   SpO2: 98% 98%     ROS  Physical Exam  Constitutional: She is oriented to person, place, and time. She appears well-developed and well-nourished.  Cardiovascular: Normal rate, regular rhythm and normal heart sounds.   Respiratory: Effort normal and breath sounds normal.  GI: Soft. Bowel sounds are normal.  Musculoskeletal: She exhibits edema.  +1 edema bilateral ankles.  ROM- unsteady standing with feet together.   Neurological: She is alert and oriented to person, place, and time.  Skin: Skin is warm and dry.  Psychiatric: She has a normal mood and affect. Her behavior is normal. Judgment and thought content normal.    Encounter Medications:   Outpatient Encounter Prescriptions as of 09/23/2016  Medication Sig Note  . acyclovir (ZOVIRAX) 400 MG tablet Take 1 tablet (400 mg total) by mouth 3 (three) times daily as needed (for flare of genital warts, cold sore). (Patient not taking: Reported on 09/15/2016) 09/15/2016: Pt does not have   . albuterol (PROVENTIL HFA;VENTOLIN HFA) 108 (90 Base) MCG/ACT inhaler Inhale 2 puffs into the lungs every 6 (six) hours as needed for wheezing or shortness of breath. 09/15/2016: As needed.   . ARTIFICIAL TEARS 0.1-0.3 % SOLN Administer 1 drop to both eyes 4 (four) times a day as needed. 09/15/2016: As needed.   Marland Kitchen aspirin 81 MG tablet Take 1 tablet (81 mg total) by mouth daily. (Patient  not taking: Reported on 09/15/2016) 09/15/2016: Pt does not have, to get  . BENZOCAINE, DENTAL, 20 % LIQD Use as directed in the mouth or throat. Daily as needed. 09/15/2016: As needed.   . bisacodyl (DULCOLAX) 5 MG EC tablet Take 1 tablet (5 mg total) by mouth daily as needed for moderate constipation. Or No BM in 2 days.   . Calcium Carbonate-Vitamin D3 (CALCIUM 600-D) 600-400 MG-UNIT TABS Take 1 tablet by mouth daily. 09/15/2016: Per son, does not have   . divalproex (DEPAKOTE) 500 MG DR tablet Take 1,000 mg by mouth at bedtime.   Marland Kitchen HYDROcodone-acetaminophen (NORCO/VICODIN) 5-325 MG tablet Take 1 tablet by mouth 2 (two) times daily.   . hydrocortisone (CORTEF) 5 MG tablet Take 1 tablet (5 mg total) by mouth daily.   . hydrocortisone (PROCTOSOL HC) 2.5 % rectal cream  09/15/2016: As needed.   . montelukast (SINGULAIR) 10 MG tablet Take 1 tablet (10 mg total) by mouth at bedtime. Reported on 04/05/2015   . Multiple Vitamin (MULTIVITAMIN) tablet Take 1 tablet by mouth daily.   . Olopatadine HCl 0.2 % SOLN instill 1 drop into both eyes once daily   . senna (SENOKOT) 8.6 MG tablet Take 1 tablet (8.6 mg total) by mouth daily.   . sertraline (ZOLOFT) 100 MG tablet Take 200 mg by mouth daily.    . simvastatin (ZOCOR) 40 MG tablet Take 1 tablet (40 mg total) by mouth  at bedtime.   Marland Kitchen Spacer/Aero Chamber Mouthpiece MISC by Does not apply route. 09/15/2016: Per son, does not have    No facility-administered encounter medications on file as of 09/23/2016.     Functional Status:   In your present state of health, do you have any difficulty performing the following activities: 09/06/2016 08/28/2016  Hearing? N N  Vision? N N  Difficulty concentrating or making decisions? N N  Walking or climbing stairs? Y Y  Dressing or bathing? N N  Doing errands, shopping? N N  Some recent data might be hidden    Fall/Depression Screening:    Fall Risk  09/11/2016 04/05/2015 12/07/2014  Falls in the past year? Yes No No   Number falls in past yr: - - -  Injury with Fall? - - -  Risk Factor Category  - - -  Risk for fall due to : Impaired balance/gait;Mental status change - -  Follow up - - -   PHQ 2/9 Scores 04/05/2015 12/07/2014 11/20/2014 10/31/2014 06/29/2014  PHQ - 2 Score 0 6 - 0 0  Exception Documentation - - Patient refusal (No Data) Medical reason    Assessment:  Pleasant 66 year old female, staying with son/his girlfriend who both assist pt as needed.   Nurse Verdon Cummins from [place] present during part of visit- obtaining lab work.   Son's girlfriend  Shontae    Present during home visit- pt's medications reviewed with her.  This RN CM following pt for transition   Of care- recent hospitalization August 4-7,2018 for weakness, repeated falls (orthostatic hypotension) Hypotension: pt's  BP today sitting 90/70, standing pt felt weak, not dizzy, unable to obtain a BP reading. Medications: review of pt's medications pt still missing Montelukast, Aspirin and Calcium with Vitamin D.      Pt and son's girlfriend aware, to let son know.    Pt took medications during home visit.      Plan:  As discussed with pt, plan to continue to follow for transition of care, follow up again next week     Telephonically.           Plan to send Galen Manila NP 09/23/16 home visit encounter.    THN CM Care Plan Problem One     Most Recent Value  Care Plan Problem One  Risk for readmission related to recent hospitalization for weakness.orthostatic hypotension, falls.   Role Documenting the Problem One  Care Management Coordinator  Care Plan for Problem One  Active  THN Long Term Goal   P would not readmit to SNF or hospital within the next 31 days   THN Long Term Goal Start Date  09/15/16  Interventions for Problem One Long Term Goal  Initial home visit done- reveiwed getting up slowly/fall risk, use of support stockings   THN CM Short Term Goal #1   Pt would keep all MD appointments in the next 30 days   THN CM Short  Term Goal #1 Start Date  09/15/16  Interventions for Short Term Goal #1  Discussed with pt upcoming visit with Dr. Earlene Plater, oral surgeon 9/10.   THN CM Short Term Goal #2   Pt would take all medications as ordered for the next 30 days   THN CM Short Term Goal #2 Start Date  09/15/16  Interventions for Short Term Goal #2  Medications reviewed with son's girlfriend (on consent form), missing meds Singulair, Aspirin, Calcium with Vitamin D.      Okey Dupre  Alver Fisher RN CCM [facility] Care Management  [phone]

## 2016-09-23 NOTE — Telephone Encounter (Signed)
Thank you.  Will await CMP results and allow psychiatry to continue managing depakote.

## 2016-09-23 NOTE — Telephone Encounter (Signed)
Should only continue one of the nasal sprays.  Sent nasonex to her pharmacy [facility] [place] [facility]

## 2016-09-23 NOTE — Telephone Encounter (Signed)
Kelly Foster with [facility] said he was not able to draw blood for depakote because it has to be done in a lab so it can be separated right away.  He did attempt to draw for CMP but pt is a hard stick.  His call back number is [phone]

## 2016-09-23 NOTE — Telephone Encounter (Signed)
Pt has a question about medication [phone]

## 2016-09-23 NOTE — Telephone Encounter (Signed)
Pt requesting refills of these medications. Please advise.

## 2016-09-24 ENCOUNTER — Ambulatory Visit (INDEPENDENT_AMBULATORY_CARE_PROVIDER_SITE_OTHER): Payer: Medicare HMO | Admitting: Surgery

## 2016-09-24 ENCOUNTER — Encounter: Payer: Self-pay | Admitting: Nurse Practitioner

## 2016-09-24 VITALS — BP 101/74 | HR 98 | Temp 97.4°F | Ht 67.0 in | Wt 182.0 lb

## 2016-09-24 DIAGNOSIS — R1010 Upper abdominal pain, unspecified: Secondary | ICD-10-CM | POA: Diagnosis not present

## 2016-09-24 DIAGNOSIS — G8929 Other chronic pain: Secondary | ICD-10-CM | POA: Diagnosis not present

## 2016-09-24 DIAGNOSIS — E870 Hyperosmolality and hypernatremia: Secondary | ICD-10-CM | POA: Diagnosis not present

## 2016-09-24 DIAGNOSIS — I951 Orthostatic hypotension: Secondary | ICD-10-CM | POA: Diagnosis not present

## 2016-09-24 DIAGNOSIS — R627 Adult failure to thrive: Secondary | ICD-10-CM | POA: Diagnosis not present

## 2016-09-24 DIAGNOSIS — I10 Essential (primary) hypertension: Secondary | ICD-10-CM | POA: Diagnosis not present

## 2016-09-24 DIAGNOSIS — E441 Mild protein-calorie malnutrition: Secondary | ICD-10-CM | POA: Diagnosis not present

## 2016-09-24 LAB — HEPATIC FUNCTION PANEL
ALT: 13 (ref 7–35)
AST: 33 (ref 13–35)
Alkaline Phosphatase: 113 (ref 25–125)
Bilirubin, Total: 0.3

## 2016-09-24 LAB — BASIC METABOLIC PANEL
BUN: 6 (ref 4–21)
Creatinine: 0.7 (ref 0.5–1.1)
GLUCOSE: 80
Potassium: 3.8 (ref 3.4–5.3)
SODIUM: 132 — AB (ref 137–147)
SODIUM: 132 — AB (ref 137–147)

## 2016-09-24 NOTE — Progress Notes (Signed)
Surgical Clinic History and Physical  Referring provider:  Galen Manila, NP [address] [place], [place] [address]  HISTORY OF PRESENT ILLNESS (HPI):  66 y.o. female presents for evaluation of cholelithiasis visualized first on CT performed at [facility] ED for constipation-associated abdominal pain 5 weeks ago and then the same day on follow-up RUQ abdominal ultrasound. Patient reports her abdominal pain was never specifically associated with eating fatty foods and completely resolved after her constipation was treated successfully with Miralax and Senna. She currently denies any post-prandial pain with ongoing flatus and daily soft formed BM's without any N/V, fever/chills, CP, or SOB.  PAST MEDICAL HISTORY (PMH):  Past Medical History:  Diagnosis Date  . Adrenal insufficiency (HCC)   . Adynamia 06/06/2013  . Allergy   . Anxiety   . Arthritis   . Below normal amount of sodium in the blood 06/06/2013  . Bipolar 1 disorder (HCC)   . Bipolar 1 disorder, mixed (HCC) 06/06/2013  . Candidiasis of mouth and esophagus (HCC) 08/13/2016  . Central spinal stenosis   . Cervical radiculopathy, chronic   . Chronic adrenal insufficiency (HCC) 06/06/2013  . Chronic low back pain without sciatica   . Chronic pain syndrome   . Cognitive change 09/16/2016   Pt w/ repetitive statements.  Poor ability for recent memory.  . Depression   . Disorder of left sacroiliac joint   . DJD (degenerative joint disease)   . Elevated CK   . Failure to thrive in adult 08/28/2016  . Hemorrhoids 07/11/2016  . Hip pain   . History of kidney stones   . Hyperlipidemia   . Hypertension   . Hyponatremia   . Hypothyroidism   . Intractable pain 12/20/2014  . Lower urinary tract infection 06/06/2013   Overview:  IMO Problem List Replacer Jan. 2016   . Lumbar radiculitis   . Manic depressive disorder (HCC)   . Neuritis or radiculitis due to rupture of lumbar intervertebral disc 05/29/2014  . OA (osteoarthritis) of hip   .  Orthostatic hypotension 08/28/2016  . Overweight   . Pain management contract agreement 08/01/2015   Overview:  [facility] AGREEMENT; Read, reviewed and signed. Copy given to patient. Original to Medical Records. PCG 09/02/2016  . Prediabetes   . Reflux esophagitis   . Sacrococcygeal disorders, not elsewhere classified   . Sacroiliac joint dysfunction 04/05/2015  . Slow transit constipation 09/12/2016  . 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 Trochanteric bursitis of left hip   . Ulnar neuropathy   . Urinary frequency   . Vaginal atrophy      PAST SURGICAL HISTORY (PSH):  Past Surgical History:  Procedure Laterality Date  . ABDOMINAL HYSTERECTOMY    . arm surgery Left   . CESAREAN SECTION    . JOINT REPLACEMENT Left 2016   dec  . TOTAL HIP ARTHROPLASTY Left 12/26/2014   Procedure: TOTAL HIP ARTHROPLASTY ANTERIOR APPROACH;  Surgeon: Kennedy Bucker, MD;  Location: [facility] ORS;  Service: Orthopedics;  Laterality: Left;     MEDICATIONS:  Prior to Admission medications   Medication Sig Start Date End Date Taking? Authorizing Provider  acyclovir (ZOVIRAX) 400 MG tablet Take 1 tablet (400 mg total) by mouth 3 (three) times daily as needed (for flare of genital warts, cold sore). 09/15/16  Yes Galen Manila, NP  albuterol (PROVENTIL HFA;VENTOLIN HFA) 108 (90 Base) MCG/ACT inhaler Inhale 2 puffs into the lungs every 6 (six) hours as needed for wheezing  or shortness of breath. 09/11/16  Yes Galen Manila, NP  ARTIFICIAL TEARS 0.1-0.3 % SOLN Administer 1 drop to both eyes 4 (four) times a day as needed. 06/06/13  Yes [provider]  aspirin 81 MG tablet Take 1 tablet (81 mg total) by mouth daily. 07/23/16  Yes Galen Manila, NP  BENZOCAINE, DENTAL, 20 % LIQD Use as directed in the mouth or throat. Daily as needed.   Yes [provider]  bisacodyl (DULCOLAX) 5 MG EC tablet Take 1 tablet (5 mg total) by mouth daily as needed  for moderate constipation. Or No BM in 2 days. 09/19/16  Yes Galen Manila, NP  Calcium Carbonate-Vitamin D3 (CALCIUM 600-D) 600-400 MG-UNIT TABS Take 1 tablet by mouth daily.   Yes [provider]  divalproex (DEPAKOTE) 500 MG DR tablet Take 1,000 mg by mouth at bedtime.   Yes [provider]  HYDROcodone-acetaminophen (NORCO/VICODIN) 5-325 MG tablet Take 1 tablet by mouth 2 (two) times daily.   Yes [provider]  hydrocortisone (CORTEF) 5 MG tablet Take 1 tablet (5 mg total) by mouth daily. 09/10/16  Yes Delfino Lovett, MD  hydrocortisone (PROCTOSOL HC) 2.5 % rectal cream  06/26/15  Yes [provider]  mometasone (NASONEX) 50 MCG/ACT nasal spray Place 2 sprays into the nose daily. 09/23/16  Yes Galen Manila, NP  montelukast (SINGULAIR) 10 MG tablet Take 1 tablet (10 mg total) by mouth at bedtime. Reported on 04/05/2015 09/23/16  Yes Galen Manila, NP  Multiple Vitamin (MULTIVITAMIN) tablet Take 1 tablet by mouth daily.   Yes [provider]  Olopatadine HCl 0.2 % SOLN instill 1 drop into both eyes once daily 07/14/16  Yes [provider]  senna (SENOKOT) 8.6 MG tablet Take 1 tablet (8.6 mg total) by mouth daily. 09/11/16  Yes Galen Manila, NP  sertraline (ZOLOFT) 100 MG tablet Take 200 mg by mouth daily.    Yes [provider]  simvastatin (ZOCOR) 40 MG tablet Take 1 tablet (40 mg total) by mouth at bedtime. 09/15/16  Yes Galen Manila, NP  Spacer/Aero Chamber Mouthpiece MISC by Does not apply route. 04/29/14  Yes [provider]     ALLERGIES:  Allergies  Allergen Reactions  . Gabapentin Other (See Comments)    tremors tremors  . Codeine Itching  . Pregabalin Other (See Comments)    Left side pain under arm, started after use of lyrica  . Tramadol Itching     SOCIAL HISTORY:  Social History   Social History  . Marital status: Single    Spouse name: N/A  . Number of children: N/A   . Years of education: N/A   Occupational History  . disabled    Social History Main Topics  . Smoking status: Former Smoker    Packs/day: 1.00    Types: Cigarettes    Quit date: 03/01/1995  . Smokeless tobacco: Never Used     Comment: quit 1998  . Alcohol use No  . Drug use: No     Comment: Former hx of Crack Cocaine quit 1998  . Sexual activity: Not on file   Other Topics Concern  . Not on file   Social History Narrative  . No narrative on file    The patient currently resides (home / rehab facility / nursing home): Home  The patient normally is (ambulatory / bedbound): Ambulatory   FAMILY HISTORY:  Family History  Problem Relation Age of Onset  .  Alzheimer's disease Mother   . Arthritis Mother   . Stroke Father   . Hypertension Father   . Cancer Father   . Colon cancer Sister   . Hypertension Sister   . Kidney disease Neg Hx     Otherwise negative/non-contributory.  REVIEW OF SYSTEMS:  Constitutional: denies any other weight loss, fever, chills, or sweats  Eyes: denies any other vision changes, history of eye injury  ENT: denies sore throat, hearing problems  Respiratory: denies shortness of breath, wheezing  Cardiovascular: denies chest pain, palpitations  Gastrointestinal: abdominal pain, N/V, and bowel function as per HPI Musculoskeletal: denies any other joint pains or cramps  Skin: Denies any other rashes or skin discolorations  Neurological: denies any other headache, dizziness, weakness  Psychiatric: Denies any other depression, anxiety   All other review of systems were otherwise negative   VITAL SIGNS:  BP 101/74   Pulse 98   Temp (!) 97.4 F (36.3 C) (Oral)   Ht 5\' 7"  (1.702 m)   Wt 182 lb (82.6 kg) Comment: Wheelchair Bound  BMI 28.51 kg/m   PHYSICAL EXAM:  Constitutional:  -- Normal body habitus  -- Awake, alert, and oriented x3  Eyes:  -- Pupils equally round and reactive to light  -- No scleral icterus  Ear, nose, throat:  -- No  jugular venous distension -- No nasal drainage, bleeding Pulmonary:  -- No crackles  -- Equal breath sounds bilaterally -- Breathing non-labored at rest Cardiovascular:  -- S1, S2 present  -- No pericardial rubs  Gastrointestinal:  -- Abdomen soft, nontender, nondistended, no guarding/rebound  -- No abdominal masses appreciated, pulsatile or otherwise  Musculoskeletal and Integumentary:  -- Wounds or skin discoloration: None appreciated -- Extremities: B/L UE and LE FROM, hands and feet warm, no edema  Neurologic:  -- Motor function: Intact and symmetric -- Sensation: Intact and symmetric  Labs:  CBC Latest Ref Rng & Units 09/09/2016 09/06/2016 08/29/2016  WBC 3.6 - 11.0 K/uL 5.9 3.7 5.4  Hemoglobin 12.0 - 16.0 g/dL 6.2(X) 11.5(L) 10.5(L)  Hematocrit 35.0 - 47.0 % 27.7(L) 33.9(L) 31.1(L)  Platelets 150 - 440 K/uL 441(H) 434 321   CMP Latest Ref Rng & Units 09/24/2016 09/24/2016 09/09/2016  Glucose 65 - 99 mg/dL - - 96  BUN 4 - 21 6 - <5(L)  Creatinine 0.5 - 1.1 0.7 - 0.49  Sodium 137 - 147 132(A) 132(A) 134(L)  Potassium 3.4 - 5.3 3.8 - 3.8  Chloride 101 - 111 mmol/L - - 103  CO2 22 - 32 mmol/L - - 26  Calcium 8.9 - 10.3 mg/dL - - 8.2(L)  Total Protein 6.5 - 8.1 g/dL - - -  Total Bilirubin 0.3 - 1.2 mg/dL - - -  Alkaline Phos 25 - 125 - 113 -  AST 13 - 35 - 33 -  ALT 7 - 35 - 13 -    Imaging studies:  CT Abdomen and Pelvis with Contrast (08/13/2016) Hepatobiliary: The liver is unremarkable. Gallbladder wall may be mildly edematous. No biliary ductal dilatation.  Pancreas: Negative.  Spleen: Negative.  Adrenals/Urinary Tract: Adrenal glands are unremarkable. Horseshoe kidney. Ureters are decompressed. Bladder is grossly unremarkable although there is a fair amount of streak artifact from a left hip arthroplasty.  Stomach/Bowel: Stomach, small bowel, appendix and colon are unremarkable.  Limited RUQ Abdominal Ultrasound (08/13/2016) Cholelithiasis.  No sonographic  evidence of acute cholecystitis.   Assessment/Plan:  66 y.o. female with asymptomatic cholelithiasis, complicated by co-morbidities including prediabetes, HTN, HLD,  adrenal insufficiency, hypothyroidism, nephrolithiasis, GERD with reflux esophagitis, chronic constipation, chronic lumbar back pain, osteoarthritis, generalized anxiety disorder, and major depression disorder with bipolar disorder.   - continue bowel regimen as per PMD   - maintain adequqte hydration and high-fiber diet to minimize constipation  - minimize/avoid fatty foods if those foods cause abdominal pain  - return to clinic as needed, instructed to call if any questions  - medical management of comorbidities  All of the above recommendations were discussed with the patient and patient's family, and all of patient's and family's questions were answered to their expressed satisfaction.  Thank you for the opportunity to participate in this patient's care.  -- Scherrie Gerlach Earlene Plater, MD, RPVI [place]: [facility] - Partnering for exceptional care. Office: [phone]

## 2016-09-24 NOTE — Patient Instructions (Signed)
If you begin to have pain or need to be seen please give our office a call.

## 2016-09-24 NOTE — Progress Notes (Signed)
Albumin = 3.1 Globulin, total = 2.9 A/G ratio = 1.1 Calcium 9.0 CO2 = 15

## 2016-09-25 ENCOUNTER — Telehealth: Payer: Self-pay | Admitting: Nurse Practitioner

## 2016-09-25 NOTE — Telephone Encounter (Signed)
Lemuel with [facility] requested a list of pt's medications to be faxed to [phone].  Pt seemed upset with her son because he is not helping with her medications.  She is managing her own meds and he's worried that she is not taking them correctly and is not agreeing to pill box.  His call back number is [phone]

## 2016-09-25 NOTE — Telephone Encounter (Signed)
I faxed the medication list as requested.

## 2016-09-30 ENCOUNTER — Telehealth: Payer: Self-pay

## 2016-09-30 ENCOUNTER — Other Ambulatory Visit: Payer: Self-pay | Admitting: *Deleted

## 2016-09-30 DIAGNOSIS — I951 Orthostatic hypotension: Secondary | ICD-10-CM | POA: Diagnosis not present

## 2016-09-30 DIAGNOSIS — E870 Hyperosmolality and hypernatremia: Secondary | ICD-10-CM | POA: Diagnosis not present

## 2016-09-30 DIAGNOSIS — I10 Essential (primary) hypertension: Secondary | ICD-10-CM | POA: Diagnosis not present

## 2016-09-30 DIAGNOSIS — E441 Mild protein-calorie malnutrition: Secondary | ICD-10-CM | POA: Diagnosis not present

## 2016-09-30 DIAGNOSIS — G8929 Other chronic pain: Secondary | ICD-10-CM | POA: Diagnosis not present

## 2016-09-30 DIAGNOSIS — R627 Adult failure to thrive: Secondary | ICD-10-CM | POA: Diagnosis not present

## 2016-09-30 NOTE — Patient Outreach (Signed)
Received a call from pt's PCP Wilhelmina Mcardle NP, HIPAA identifiers verified on pt-  PCP  reported  pt having continued falls, needs a higher level of care,additional assistance, current caregivers are not permanent, inquired about [facility] LCSW services to assist.    Plan:  As discussed, RN CM to make referral for [facility] social worker.    Shayne Alken.   Pierzchala RN CCM [facility] Care Management  [phone]

## 2016-09-30 NOTE — Telephone Encounter (Addendum)
Reviewed chart for any possible causes of recurrent falls.  Is multifactorial - depakote, hyponatremia (w/ dizziness, nausea, vomiting), adrenal insufficiency all could contribute.   Pt w/ home care and continued difficulties.  PT initiated and not improving patient's frequency of falls.   Call placed to Jodi Mourning, RN [facility] Care management.  Pt unable to live independently and has been relying on family assistance.  During [name]'s visit, pt did not always use her walker as recommended by PT. - Prior indication provided by Betha Loa that pt's son and his girlfriend are not going to be permanently able to care for patient. - Awaiting personal care services application response, but now unsure that this will be sufficient for keeping pt stable at this time. - Consider SNF for stabilization w/ consideration of transfer back to assisted living.   - Would likely need social work for placement.  Referral to [facility] care management.   Wilhelmina Mcardle, DNP, AGPCNP-BC Adult Gerontology Primary Care Nurse Practitioner [facility]

## 2016-09-30 NOTE — Telephone Encounter (Signed)
Kelly Physical therapy from [facility] called to inform us that Kelly Foster fell this morning. She states her son notified her of the fall when she arrived at the house. The pt doesn't appear to have injured herself from the fall per [facility] evaluation. I asked her if the pt told her how she fell. She informed me that the pt stated she was walking across her room and just fell down. The pt is complaining of dizziness, but this is not a new symptom. She said the pt room is very cluttered and hard to maneuver around.

## 2016-10-02 ENCOUNTER — Telehealth: Payer: Self-pay

## 2016-10-02 DIAGNOSIS — I951 Orthostatic hypotension: Secondary | ICD-10-CM | POA: Diagnosis not present

## 2016-10-02 DIAGNOSIS — E441 Mild protein-calorie malnutrition: Secondary | ICD-10-CM | POA: Diagnosis not present

## 2016-10-02 DIAGNOSIS — I10 Essential (primary) hypertension: Secondary | ICD-10-CM | POA: Diagnosis not present

## 2016-10-02 DIAGNOSIS — G8929 Other chronic pain: Secondary | ICD-10-CM | POA: Diagnosis not present

## 2016-10-02 DIAGNOSIS — E870 Hyperosmolality and hypernatremia: Secondary | ICD-10-CM | POA: Diagnosis not present

## 2016-10-02 DIAGNOSIS — R627 Adult failure to thrive: Secondary | ICD-10-CM | POA: Diagnosis not present

## 2016-10-02 NOTE — Telephone Encounter (Signed)
The pt called back and reported her son dropped her off at home. I contacted the pt son and stress the concern of her being at hom by herself with her blood pressure being so low. I recommended that he take her to ER to be evaluated. He agreed to contact EMS.

## 2016-10-02 NOTE — Telephone Encounter (Signed)
Verdon CumminsJesse, RN [facility] contacted.  Pt w/ normal speech at home visit earlier today.  Confirmed w/ Tedd SiasKelita that pt has agreed to go to hospital via EMS for further evalutation.  Also approved orders for Verdon CumminsJesse to request social work eval through [facility].  Pt will need full time caregiver available to her (SNF, group home, or other personal care services).

## 2016-10-02 NOTE — Telephone Encounter (Signed)
The pt called reporting she [facility] [employer] [employer].  EMS was called but the pt refuse for them to take her to the hospital. Her speech is more slurred than usual and very hard to understand. From my intake she fell and EMS came out and took her bp and told her it was low. She reports that it was 80/25? And they wanted to take her to the hospital, but the pt refused because she got a little girl with her and she didn't want them to take her away. She did state her son was on his way to pick them up and take her to the hospital. The pt told me to hold on and the phone call got disconnected. I attempted to contact her son, no answer I left a detail message on his vm.

## 2016-10-04 DIAGNOSIS — R42 Dizziness and giddiness: Secondary | ICD-10-CM | POA: Diagnosis not present

## 2016-10-04 DIAGNOSIS — G894 Chronic pain syndrome: Secondary | ICD-10-CM | POA: Diagnosis not present

## 2016-10-04 DIAGNOSIS — R531 Weakness: Secondary | ICD-10-CM | POA: Diagnosis not present

## 2016-10-04 DIAGNOSIS — G4700 Insomnia, unspecified: Secondary | ICD-10-CM | POA: Diagnosis not present

## 2016-10-04 DIAGNOSIS — E870 Hyperosmolality and hypernatremia: Secondary | ICD-10-CM | POA: Diagnosis not present

## 2016-10-04 DIAGNOSIS — M1990 Unspecified osteoarthritis, unspecified site: Secondary | ICD-10-CM | POA: Diagnosis not present

## 2016-10-04 DIAGNOSIS — I951 Orthostatic hypotension: Secondary | ICD-10-CM | POA: Diagnosis not present

## 2016-10-04 DIAGNOSIS — F319 Bipolar disorder, unspecified: Secondary | ICD-10-CM | POA: Diagnosis not present

## 2016-10-04 DIAGNOSIS — E871 Hypo-osmolality and hyponatremia: Secondary | ICD-10-CM | POA: Diagnosis not present

## 2016-10-04 DIAGNOSIS — R8299 Other abnormal findings in urine: Secondary | ICD-10-CM | POA: Diagnosis not present

## 2016-10-04 DIAGNOSIS — K120 Recurrent oral aphthae: Secondary | ICD-10-CM | POA: Diagnosis not present

## 2016-10-04 DIAGNOSIS — Z66 Do not resuscitate: Secondary | ICD-10-CM | POA: Diagnosis not present

## 2016-10-04 DIAGNOSIS — I959 Hypotension, unspecified: Secondary | ICD-10-CM | POA: Diagnosis not present

## 2016-10-04 DIAGNOSIS — E2740 Unspecified adrenocortical insufficiency: Secondary | ICD-10-CM | POA: Diagnosis not present

## 2016-10-04 DIAGNOSIS — K210 Gastro-esophageal reflux disease with esophagitis: Secondary | ICD-10-CM | POA: Diagnosis not present

## 2016-10-04 DIAGNOSIS — E785 Hyperlipidemia, unspecified: Secondary | ICD-10-CM | POA: Diagnosis not present

## 2016-10-07 ENCOUNTER — Encounter: Payer: Self-pay | Admitting: Emergency Medicine

## 2016-10-07 ENCOUNTER — Ambulatory Visit: Payer: Self-pay | Admitting: Nurse Practitioner

## 2016-10-07 ENCOUNTER — Emergency Department
Admission: EM | Admit: 2016-10-07 | Discharge: 2016-10-07 | Disposition: A | Discharge status: Home / Self Care | Payer: Medicare HMO | Attending: Emergency Medicine | Admitting: Emergency Medicine

## 2016-10-07 ENCOUNTER — Other Ambulatory Visit: Payer: Self-pay | Admitting: *Deleted

## 2016-10-07 DIAGNOSIS — Z79899 Other long term (current) drug therapy: Secondary | ICD-10-CM | POA: Diagnosis not present

## 2016-10-07 DIAGNOSIS — Z96642 Presence of left artificial hip joint: Secondary | ICD-10-CM | POA: Insufficient documentation

## 2016-10-07 DIAGNOSIS — E039 Hypothyroidism, unspecified: Secondary | ICD-10-CM | POA: Insufficient documentation

## 2016-10-07 DIAGNOSIS — I959 Hypotension, unspecified: Secondary | ICD-10-CM | POA: Diagnosis not present

## 2016-10-07 DIAGNOSIS — Z7982 Long term (current) use of aspirin: Secondary | ICD-10-CM | POA: Diagnosis not present

## 2016-10-07 DIAGNOSIS — E2740 Unspecified adrenocortical insufficiency: Secondary | ICD-10-CM | POA: Diagnosis not present

## 2016-10-07 DIAGNOSIS — R55 Syncope and collapse: Secondary | ICD-10-CM | POA: Diagnosis not present

## 2016-10-07 DIAGNOSIS — I10 Essential (primary) hypertension: Secondary | ICD-10-CM | POA: Insufficient documentation

## 2016-10-07 DIAGNOSIS — Z87891 Personal history of nicotine dependence: Secondary | ICD-10-CM | POA: Insufficient documentation

## 2016-10-07 LAB — COMPREHENSIVE METABOLIC PANEL
ALBUMIN: 2.2 g/dL — AB (ref 3.5–5.0)
ALT: 13 U/L — ABNORMAL LOW (ref 14–54)
ANION GAP: 7 (ref 5–15)
AST: 24 U/L (ref 15–41)
Alkaline Phosphatase: 76 U/L (ref 38–126)
CO2: 27 mmol/L (ref 22–32)
Calcium: 8.7 mg/dL — ABNORMAL LOW (ref 8.9–10.3)
Chloride: 98 mmol/L — ABNORMAL LOW (ref 101–111)
Creatinine, Ser: 0.59 mg/dL (ref 0.44–1.00)
GFR calc Af Amer: >60 mL/min (ref >60)
GFR calc non Af Amer: >60 mL/min (ref >60)
GLUCOSE: 102 mg/dL — AB (ref 65–99)
POTASSIUM: 3.5 mmol/L (ref 3.5–5.1)
SODIUM: 132 mmol/L — AB (ref 135–145)
TOTAL PROTEIN: 5.3 g/dL — AB (ref 6.5–8.1)
Total Bilirubin: 0.1 mg/dL — ABNORMAL LOW (ref 0.3–1.2)

## 2016-10-07 LAB — CBC
HEMATOCRIT: 26.7 % — AB (ref 35.0–47.0)
Hemoglobin: 9.2 g/dL — ABNORMAL LOW (ref 12.0–16.0)
MCH: 30.2 pg (ref 26.0–34.0)
MCHC: 34.5 g/dL (ref 32.0–36.0)
MCV: 87.7 fL (ref 80.0–100.0)
Platelets: 437 10*3/uL (ref 150–440)
RBC: 3.05 MIL/uL — ABNORMAL LOW (ref 3.80–5.20)
RDW: 15.7 % — AB (ref 11.5–14.5)
WBC: 7.6 10*3/uL (ref 3.6–11.0)

## 2016-10-07 LAB — TROPONIN I: Troponin I: <0.03 ng/mL (ref <0.03)

## 2016-10-07 LAB — CORTISOL: Cortisol, Plasma: 13.2 ug/dL

## 2016-10-07 MED ORDER — DEXAMETHASONE SODIUM PHOSPHATE 4 MG/ML IJ SOLN
4.0000 mg | Freq: Once | INTRAMUSCULAR | Status: AC
  Administered 2016-10-07: 4 mg via INTRAVENOUS
  Filled 2016-10-07: qty 1

## 2016-10-07 MED ORDER — NYSTATIN 100000 UNIT/ML MT SUSP: 5.0000 mL | Freq: Four times a day (QID) | OROMUCOSAL | 0 refills | Status: DC

## 2016-10-07 MED ORDER — SODIUM CHLORIDE 0.9 % IV SOLN
1000.0000 mL | Freq: Once | INTRAVENOUS | Status: AC
  Administered 2016-10-07: 1000 mL via INTRAVENOUS
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
## 2016-10-07 NOTE — Discharge Instructions (Signed)
Please take your Cortef as directed, this is very important.

## 2016-10-07 NOTE — ED Triage Notes (Signed)
ARrives from PCP office for c/o syncope.  [facility]  EMS report patient hypotensive, SBP 60's.  On arrival to ED patient sleepy, easy to wake, AAOx3.   Skin cool and dry.

## 2016-10-07 NOTE — Patient Outreach (Signed)
9:00 am- This RN CM received a call from [employer] nurse health advisor from PCP office, pt currently in the office, saw in the past it was hard to reach pt. HIPAA identifiers provided(name,dob) on pt.   Tiffany inquired about [facility], reports Ivan [place]se [place] from [facility] [place] also made a [place], pt needs a higher level of care (SNF) - confirmed  current caregivers will not be staying long.  RN CM discussed with Tiffany view in Epic, [facility] to which Tiffany confirmed as  she does the  transition of care calls.   As discussed with Tiffany, to follow up on SW referral.     Shayne Alkenose M.   Anne Sebring RN CCM [facility]  [phone]   9:10 am- This RN CM received an in basket [facility] L[facility] that pt going back to hospital, BP 50/28.     RN CM to follow up with pt at discharge.    Shayne Alkenose M.   Berklie Dethlefs RN CCM [facility] Care [facility]  [phone]

## 2016-10-07 NOTE — ED Notes (Signed)
Pt given crackers, peanut butter, and ginger ale.  

## 2016-10-07 NOTE — ED Notes (Signed)
Pt assisted with bed pan. Pt ask for something to eat and nurse advised.

## 2016-10-08 ENCOUNTER — Telehealth: Payer: Self-pay | Admitting: Nurse Practitioner

## 2016-10-08 NOTE — Telephone Encounter (Signed)
Pt asked for a call back [phone]

## 2016-10-08 NOTE — Telephone Encounter (Signed)
The pt called to f/u on home health information. I informed her that she will be [facility]. She verbalize understanding.

## 2016-10-09 ENCOUNTER — Telehealth: Payer: Self-pay | Admitting: Nurse Practitioner

## 2016-10-09 ENCOUNTER — Other Ambulatory Visit: Payer: Self-pay | Admitting: *Deleted

## 2016-10-09 DIAGNOSIS — F3162 Bipolar disorder, current episode mixed, moderate: Secondary | ICD-10-CM

## 2016-10-09 DIAGNOSIS — T50901D Poisoning by unspecified drugs, medicaments and biological substances, accidental (unintentional), subsequent encounter: Secondary | ICD-10-CM

## 2016-10-09 DIAGNOSIS — E2740 Unspecified adrenocortical insufficiency: Secondary | ICD-10-CM

## 2016-10-09 NOTE — Patient Outreach (Signed)
Successful telephone encounter to Kelly Foster, 66 year old female for transition of care, follow up on recent [facility], adrenal insufficiency and recent ED visit 10/07/16 for hypotension.    Spoke with pt, HIPAA identifiers verified.  Pt reports on recent hospitalization, BP up and down, saw PCP post discharge and was sent to ED because of low BP.   Pt reports is fine now, makes sure she has something on her stomach- had 2 eggs this am/ some difficulty understanding what pt was saying.  Inquired of pt if Home health checking her BP to which pt reports no longer has current home health agency, PCP working on finding her a new agency.   RN CM discussed with pt plan to continue to follow for transition of care- weekly calls, a home visit.     Plan:  As discussed with pt, plan to follow up again next week- home visit.    Kelly Alkenose M.   Pierzchala RN CCM [facility] Care Management  [phone]

## 2016-10-09 NOTE — Telephone Encounter (Signed)
Pt asked for a nurse to call [phone]

## 2016-10-09 NOTE — Patient Outreach (Signed)
Successful telephone encounter to Kelly Foster, 66 year old female for transition of care/follow up on recent hospitalization September 1-3,2013 for Hypotension, Adrenal insufficiency.  Spoke with pt, HIPAA identifiers verified, discussed purpose of call- omitted on earlier call reviewing discharge medications.   RN CM inquired of pt if son  Continues to manage medications to which pt reports son left/got mad at me/currently doing her own medications/taking  As ordered.  Pt reports taking Hydrocortisone 5 mg - 3 tablets in the morning/one at night, making sure to take  With food.  Pt reports she had 2 eggs this am and had no problems.   Patient was recently discharged from hospital and all medications have been reviewed.  Plan:  Reminded pt of home visit scheduled for next week.     Kelly Alkenose M.   Pierzchala RN CCM [facility] Care Management  [phone]
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
## 2016-10-10 ENCOUNTER — Other Ambulatory Visit: Payer: Self-pay | Admitting: Nurse Practitioner

## 2016-10-10 ENCOUNTER — Other Ambulatory Visit: Payer: Self-pay

## 2016-10-10 ENCOUNTER — Telehealth: Payer: Self-pay

## 2016-10-10 MED ORDER — SIMVASTATIN 40 MG PO TABS: 40.0000 mg | ORAL_TABLET | Freq: Every day | ORAL | 5 refills | Status: AC

## 2016-10-10 NOTE — Telephone Encounter (Signed)
Appreciate this input.  Pt also called by Jodi Mourningose Pierzchala, RN CM w/ [facility] yesterday.  Pt [facility] a different [facility].   Referral placed to [facility] for RN, PT, CSW.  Pt does not have followup appointment scheduled.  Needs to schedule in office visit for next week.

## 2016-10-10 NOTE — Telephone Encounter (Signed)
Appt scheduled for Thursday, Sept 13th @ 3:00pm. I attempted to contact the pt to notify herm but her vm states the party you are trying to reach is not available please try your call again later. I will attempt to contact her back later.

## 2016-10-13 NOTE — Telephone Encounter (Signed)
Kelly Foster was notified of her upcoming appt scheduled for this Thursday,  no question or concerns.

## 2016-10-13 NOTE — Telephone Encounter (Signed)
Error

## 2016-10-14 ENCOUNTER — Ambulatory Visit (INDEPENDENT_AMBULATORY_CARE_PROVIDER_SITE_OTHER): Payer: Medicare HMO | Admitting: Nurse Practitioner

## 2016-10-14 ENCOUNTER — Ambulatory Visit (INDEPENDENT_AMBULATORY_CARE_PROVIDER_SITE_OTHER): Payer: Medicare HMO

## 2016-10-14 ENCOUNTER — Encounter: Payer: Self-pay | Admitting: Nurse Practitioner

## 2016-10-14 VITALS — BP 120/70 | HR 96 | Temp 98.4°F | Resp 16 | Ht 67.0 in | Wt 169.4 lb

## 2016-10-14 VITALS — BP 120/70 | HR 96 | Temp 98.4°F | Resp 16 | Ht 67.0 in | Wt 169.0 lb

## 2016-10-14 DIAGNOSIS — B370 Candidal stomatitis: Secondary | ICD-10-CM | POA: Diagnosis not present

## 2016-10-14 DIAGNOSIS — Z23 Encounter for immunization: Secondary | ICD-10-CM | POA: Diagnosis not present

## 2016-10-14 DIAGNOSIS — Z1239 Encounter for other screening for malignant neoplasm of breast: Secondary | ICD-10-CM

## 2016-10-14 DIAGNOSIS — Z1231 Encounter for screening mammogram for malignant neoplasm of breast: Secondary | ICD-10-CM

## 2016-10-14 DIAGNOSIS — Z0000 Encounter for general adult medical examination without abnormal findings: Secondary | ICD-10-CM | POA: Diagnosis not present

## 2016-10-14 MED ORDER — MAGIC MOUTHWASH: ORAL | 0 refills | Status: AC

## 2016-10-14 NOTE — Patient Instructions (Addendum)
[name] , Thank you for taking time to come for your Medicare Wellness Visit. I appreciate your ongoing commitment to your health goals. Please review the following plan we discussed and let me know if I can assist you in the future.   Screening recommendations/referrals: Colonoscopy: up to date Mammogram: Please call [phone] to schedule your mammogram.  Bone Density: declined Recommended yearly ophthalmology/optometry visit for glaucoma screening and checkup Recommended yearly dental visit for hygiene and checkup  Vaccinations: Influenza vaccine: done today Pneumococcal vaccine: prevnar 13 done today, pneumovax 23 due 10/15/2017 Tdap vaccine: up to date Shingles vaccine: due, check with your insurance company for coverage  Advanced directives: Please bring a copy of your health care power of attorney and living will to the office at your convenience.  Conditions/risks identified: recommend drinking at least 5-6 glasses of water a day   Next appointment: Follow up in one year for your annual wellness exam.    Preventive Care 65 Years and Older, Female Preventive care refers to lifestyle choices and visits with your health care provider that can promote health and wellness. What does preventive care include?  A yearly physical exam. This is also called an annual well check.  Dental exams once or twice a year.  Routine eye exams. Ask your health care provider how often you should have your eyes checked.  Personal lifestyle choices, including:  Daily care of your teeth and gums.  Regular physical activity.  Eating a healthy diet.  Avoiding tobacco and drug use.  Limiting alcohol use.  Practicing safe sex.  Taking low-dose aspirin every day.  Taking vitamin and mineral supplements as recommended by your health care provider. What happens during an annual well check? The services and screenings done by your health care provider during your annual well check will  depend on your age, overall health, lifestyle risk factors, and family history of disease. Counseling  Your health care provider may ask you questions about your:  Alcohol use.  Tobacco use.  Drug use.  Emotional well-being.  Home and relationship well-being.  Sexual activity.  Eating habits.  History of falls.  Memory and ability to understand (cognition).  Work and work Astronomerenvironment.  Reproductive health. Screening  You may have the following tests or measurements:  Height, weight, and BMI.  Blood pressure.  Lipid and cholesterol levels. These may be checked every 5 years, or more frequently if you are over 65 years old.  Skin check.  Lung cancer screening. You may have this screening every year starting at age 65 if you have a 30-pack-year history of smoking and currently smoke or have quit within the past 15 years.  Fecal occult blood test (FOBT) of the stool. You may have this test every year starting at age 250.  Flexible sigmoidoscopy or colonoscopy. You may have a sigmoidoscopy every 5 years or a colonoscopy every 10 years starting at age 65.  Hepatitis C blood test.  Hepatitis B blood test.  Sexually transmitted disease (STD) testing.  Diabetes screening. This is done by checking your blood sugar (glucose) after you have not eaten for a while (fasting). You may have this done every 1-3 years.  Bone density scan. This is done to screen for osteoporosis. You may have this done starting at age 65.  Mammogram. This may be done every 1-2 years. Talk to your health care provider about how often you should have regular mammograms. Talk with your health care provider about your test results, treatment options,  and if necessary, the need for more tests. Vaccines  Your health care provider may recommend certain vaccines, such as:  Influenza vaccine. This is recommended every year.  Tetanus, diphtheria, and acellular pertussis (Tdap, Td) vaccine. You may need a  Td booster every 10 years.  Zoster vaccine. You may need this after age 2.  Pneumococcal 13-valent conjugate (PCV13) vaccine. One dose is recommended after age 2.  Pneumococcal polysaccharide (PPSV23) vaccine. One dose is recommended after age 38. Talk to your health care provider about which screenings and vaccines you need and how often you need them. This information is not intended to replace advice given to you by your health care provider. Make sure you discuss any questions you have with your health care provider. Document Released: 02/16/2015 Document Revised: 10/10/2015 Document Reviewed: 11/21/2014 Elsevier Interactive Patient Education  2017 [employer] Prevention in the Home Falls can cause injuries. They can happen to people of all ages. There are many things you can do to make your home safe and to help prevent falls. What can I do on the outside of my home?  Regularly fix the edges of walkways and driveways and fix any cracks.  Remove anything that might make you trip as you walk through a door, such as a raised step or threshold.  Trim any bushes or trees on the path to your home.  Use bright outdoor lighting.  Clear any walking paths of anything that might make someone trip, such as rocks or tools.  Regularly check to see if handrails are loose or broken. Make sure that both sides of any steps have handrails.  Any raised decks and porches should have guardrails on the edges.  Have any leaves, snow, or ice cleared regularly.  Use sand or salt on walking paths during winter.  Clean up any spills in your garage right away. This includes oil or grease spills. What can I do in the bathroom?  Use night lights.  Install grab bars by the toilet and in the tub and shower. Do not use towel bars as grab bars.  Use non-skid mats or decals in the tub or shower.  If you need to sit down in the shower, use a plastic, non-slip stool.  Keep the floor dry. Clean  up any water that spills on the floor as soon as it happens.  Remove soap buildup in the tub or shower regularly.  Attach bath mats securely with double-sided non-slip rug tape.  Do not have throw rugs and other things on the floor that can make you trip. What can I do in the bedroom?  Use night lights.  Make sure that you have a light by your bed that is easy to reach.  Do not use any sheets or blankets that are too big for your bed. They should not hang down onto the floor.  Have a firm chair that has side arms. You can use this for support while you get dressed.  Do not have throw rugs and other things on the floor that can make you trip. What can I do in the kitchen?  Clean up any spills right away.  Avoid walking on wet floors.  Keep items that you use a lot in easy-to-reach places.  If you need to reach something above you, use a strong step stool that has a grab bar.  Keep electrical cords out of the way.  Do not use floor polish or wax that makes floors slippery. If  you must use wax, use non-skid floor wax.  Do not have throw rugs and other things on the floor that can make you trip. What can I do with my stairs?  Do not leave any items on the stairs.  Make sure that there are handrails on both sides of the stairs and use them. Fix handrails that are broken or loose. Make sure that handrails are as long as the stairways.  Check any carpeting to make sure that it is firmly attached to the stairs. Fix any carpet that is loose or worn.  Avoid having throw rugs at the top or bottom of the stairs. If you do have throw rugs, attach them to the floor with carpet tape.  Make sure that you have a light switch at the top of the stairs and the bottom of the stairs. If you do not have them, ask someone to add them for you. What else can I do to help prevent falls?  Wear shoes that:  Do not have high heels.  Have rubber bottoms.  Are comfortable and fit you well.  Are  closed at the toe. Do not wear sandals.  If you use a stepladder:  Make sure that it is fully opened. Do not climb a closed stepladder.  Make sure that both sides of the stepladder are locked into place.  Ask someone to hold it for you, if possible.  Clearly mark and make sure that you can see:  Any grab bars or handrails.  First and last steps.  Where the edge of each step is.  Use tools that help you move around (mobility aids) if they are needed. These include:  Canes.  Walkers.  Scooters.  Crutches.  Turn on the lights when you go into a dark area. Replace any light bulbs as soon as they burn out.  Set up your furniture so you have a clear path. Avoid moving your furniture around.  If any of your floors are uneven, fix them.  If there are any pets around you, be aware of where they are.  Review your medicines with your doctor. Some medicines can make you feel dizzy. This can increase your chance of falling. Ask your doctor what other things that you can do to help prevent falls. This information is not intended to replace advice given to you by your health care provider. Make sure you discuss any questions you have with your health care provider. Document Released: 11/16/2008 Document Revised: 06/28/2015 Document Reviewed: 02/24/2014 Elsevier Interactive Patient Education  2017 [employer].

## 2016-10-14 NOTE — Patient Instructions (Addendum)
Kelly Foster, Thank you for coming in to clinic today.  1. For your thrush.  Use magic mouthwash four times per day - swish, gargle, and spit.   - Send to ENT for referral.     Please schedule a follow-up appointment with Wilhelmina Mcardle, AGNP. Return 7-10 days if symptoms worsen or fail to improve.  If you have any other questions or concerns, please feel free to call the clinic or send a message through MyChart. You may also schedule an earlier appointment if necessary.  You will receive a survey after today's visit either digitally by e-mail or paper by [employer]. Your experiences and feedback matter to [place].  Please respond so we know how we are doing as we provide care for you.   Wilhelmina Mcardle, DNP, AGNP-BC Adult Gerontology Nurse Practitioner [facility], [facility]    Oral Kelly Foster, Adult Oral thrush, also called oral candidiasis, is a fungal infection that develops in the mouth and throat and on the tongue. It causes white patches to form on the mouth and tongue. Kelly Foster is most common in older adults, but it can occur at any age. Many cases of thrush are mild, but this infection can also be serious. Kelly Foster can be a repeated (recurrent) problem for certain people who have a weak body defense system (immune system). The weakness can be caused by chronic illnesses, or by taking medicines that limit the body's ability to fight infection. If a person has difficulty fighting infection, the fungus that causes thrush can spread through the body. This can cause life-threatening blood or organ infections. What are the causes? This condition is caused by a fungus (yeast) called Candida albicans.  This fungus is normally present in small amounts in the mouth and on other mucous membranes. It usually causes no harm.  If conditions are present that allow the fungus to grow without control, it invades surrounding tissues and becomes an infection.  Other Candida species can also lead to  thrush (rare).  What increases the risk? This condition is more likely to develop in:  People with a weakened immune system.  Older adults.  People with HIV (human immunodeficiency virus).  People with diabetes.  People with dry mouth (xerostomia).  Pregnant women.  People with poor dental care, especially people who have false teeth.  People who use antibiotic medicines.  What are the signs or symptoms? Symptoms of this condition can vary from mild and moderate to severe and persistent. Symptoms may include:  A burning feeling in the mouth and throat. This can occur at the start of a thrush infection.  White patches that stick to the mouth and tongue. The tissue around the patches may be red, raw, and painful. If rubbed (during tooth brushing, for example), the patches and the tissue of the mouth may bleed easily.  A bad taste in the mouth or difficulty tasting foods.  A cottony feeling in the mouth.  Pain during eating and swallowing.  Poor appetite.  Cracking at the corners of the mouth.  How is this diagnosed? This condition is diagnosed based on:  Physical exam. Your health care provider will look in your mouth.  Health history. Your health care provider will ask you questions about your health.  How is this treated? This condition is treated with medicines called antifungals, which prevent the growth of fungi. These medicines are either applied directly to the affected area (topical) or swallowed (oral). The treatment will depend on the severity of  the condition. Mild thrush Mild cases of thrush may clear up with the use of an antifungal mouth rinse or lozenges. Treatment usually lasts about 14 days. Moderate to severe thrush  More severe thrush infections that have spread to the esophagus are treated with an oral antifungal medicine. A topical antifungal medicine may also be used.  For some severe infections, treatment may need to continue for more than 14  days.  Oral antifungal medicines are rarely used during pregnancy because they may be harmful to the unborn child. If you are pregnant, talk with your health care provider about options for treatment. Persistent or recurrent thrush For cases of thrush that do not go away or keep coming back:  Treatment may be needed twice as long as the symptoms last.  Treatment will include both oral and topical antifungal medicines.  People with a weakened immune system can take an antifungal medicine on a continuous basis to prevent thrush infections.  It is important to treat conditions that make a person more likely to get thrush, such as diabetes or HIV. Follow these instructions at home: Medicines  Take over-the-counter and prescription medicines only as told by your health care provider.  Talk with your health care provider about an over-the-counter medicine called gentian violet, which kills bacteria and fungi. Relieving soreness and discomfort To help reduce the discomfort of thrush:  Drink cold liquids such as water or iced tea.  Try flavored ice treats or frozen juices.  Eat foods that are easy to swallow, such as gelatin, ice cream, or custard.  Try drinking from a straw if the patches in your mouth are painful.  General instructions  Eat plain, unflavored yogurt as directed by your health care provider. Check the label to make sure the yogurt contains live cultures. This yogurt can help healthy bacteria to grow in the mouth and can stop the growth of the fungus that causes thrush.  If you wear dentures, remove the dentures before going to bed, brush them vigorously, and soak them in a cleaning solution as directed by your health care provider.  Rinse your mouth with a warm salt-water mixture several times a day. To make a salt-water mixture, completely dissolve 1/2-1 tsp of salt in 1 cup of warm water. Contact a health care provider if:  Your symptoms are getting worse or are not  improving within 7 days of starting treatment.  You have symptoms of a spreading infection, such as white patches on the skin outside of the mouth. This information is not intended to replace advice given to you by your health care provider. Make sure you discuss any questions you have with your health care provider. Document Released: 10/16/2003 Document Revised: 10/15/2015 Document Reviewed: 10/15/2015 Elsevier Interactive Patient Education  2017 [employer]

## 2016-10-14 NOTE — Progress Notes (Signed)
Subjective:   Kelly Foster is a 66 y.o. female who presents for an Initial Medicare Annual Wellness Visit.  Review of Systems     Cardiac Risk Factors include: advanced age (>[id number], 87>65 women);dyslipidemia     Objective:    Today's Vitals   10/14/16 0856 10/14/16 0859  BP: 120/70   Pulse: 96   Resp: 16   Temp: 98.4 F (36.9 C)   Weight: 169 lb 6.4 oz (76.8 kg)   Height: 5\' 7"  (1.702 m)   PainSc:  10-Worst pain ever   Body mass index is 26.53 kg/m.   Current Medications (verified) Outpatient Encounter Prescriptions as of 10/14/2016  Medication Sig  . acyclovir (ZOVIRAX) 400 MG tablet Take 1 tablet (400 mg total) by mouth 3 (three) times daily as needed (for flare of genital warts, cold sore). (Patient not taking: Reported on 10/14/2016)  . ARTIFICIAL TEARS 0.1-0.3 % SOLN Administer 1 drop to both eyes 4 (four) times a day as needed.  Marland Kitchen. aspirin 81 MG tablet Take 1 tablet (81 mg total) by mouth daily.  . Calcium Carbonate-Vitamin D3 (CALCIUM 600-D) 600-400 MG-UNIT TABS Take 1 tablet by mouth daily.  Marland Kitchen. HYDROcodone-acetaminophen (NORCO/VICODIN) 5-325 MG tablet Take 1 tablet by mouth 2 (two) times daily.  . hydrocortisone (CORTEF) 5 MG tablet Take 1 tablet (5 mg total) by mouth daily.  . hydrocortisone (PROCTOSOL HC) 2.5 % rectal cream   . mometasone (NASONEX) 50 MCG/ACT nasal spray Place 2 sprays into the nose daily. (Patient not taking: Reported on 10/14/2016)  . montelukast (SINGULAIR) 10 MG tablet Take 1 tablet (10 mg total) by mouth at bedtime. Reported on 04/05/2015 (Patient not taking: Reported on 10/14/2016)  . Multiple Vitamin (MULTIVITAMIN) tablet Take 1 tablet by mouth daily.  Marland Kitchen. nystatin (MYCOSTATIN) 100000 UNIT/ML suspension Take 5 mLs (500,000 Units total) by mouth 4 (four) times daily. (Patient not taking: Reported on 10/14/2016)  . senna (SENOKOT) 8.6 MG tablet Take 1 tablet (8.6 mg total) by mouth daily.  . [DISCONTINUED] divalproex (DEPAKOTE) 500 MG DR tablet Take  1,000 mg by mouth at bedtime.  . [DISCONTINUED] sertraline (ZOLOFT) 100 MG tablet Take 200 mg by mouth daily.   Marland Kitchen. albuterol (PROVENTIL HFA;VENTOLIN HFA) 108 (90 Base) MCG/ACT inhaler Inhale 2 puffs into the lungs every 6 (six) hours as needed for wheezing or shortness of breath. (Patient not taking: Reported on 10/14/2016)  . amitriptyline (ELAVIL) 50 MG tablet TK 1 T PO QHS  . BENZOCAINE, DENTAL, 20 % LIQD Use as directed in the mouth or throat. Daily as needed.  . divalproex (DEPAKOTE ER) 500 MG 24 hr tablet TK 2 TS PO HS  . simvastatin (ZOCOR) 40 MG tablet Take 1 tablet (40 mg total) by mouth at bedtime.  Marland Kitchen. Spacer/Aero Chamber Mouthpiece MISC by Does not apply route.  . [DISCONTINUED] bisacodyl (DULCOLAX) 5 MG EC tablet Take 1 tablet (5 mg total) by mouth daily as needed for moderate constipation. Or No BM in 2 days. (Patient not taking: Reported on 10/09/2016)  . [DISCONTINUED] Olopatadine HCl 0.2 % SOLN instill 1 drop into both eyes once daily   No facility-administered encounter medications on file as of 10/14/2016.     Allergies (verified) Gabapentin; Codeine; Pregabalin; and Tramadol   History: Past Medical History:  Diagnosis Date  . Adrenal insufficiency (HCC)   . Adynamia 06/06/2013  . Allergy   . Anxiety   . Arthritis   . Below normal amount of sodium in the blood 06/06/2013  .  Bipolar 1 disorder (HCC)   . Bipolar 1 disorder, mixed (HCC) 06/06/2013  . Candidiasis of mouth and esophagus (HCC) 08/13/2016  . Central spinal stenosis   . Cervical radiculopathy, chronic   . Chronic adrenal insufficiency (HCC) 06/06/2013  . Chronic low back pain without sciatica   . Chronic pain syndrome   . Cognitive change 09/16/2016   Pt w/ repetitive statements.  Poor ability for recent memory.  . Depression   . Disorder of left sacroiliac joint   . DJD (degenerative joint disease)   . Elevated CK   . Failure to thrive in adult 08/28/2016  . Hemorrhoids 07/11/2016  . Hip pain   . History of kidney  stones   . Hyperlipidemia   . Hypertension   . Hyponatremia   . Hypothyroidism   . Intractable pain 12/20/2014  . Lower urinary tract infection 06/06/2013   Overview:  IMO Problem List Replacer Jan. 2016   . Lumbar radiculitis   . Manic depressive disorder (HCC)   . Neuritis or radiculitis due to rupture of lumbar intervertebral disc 05/29/2014  . OA (osteoarthritis) of hip   . Orthostatic hypotension 08/28/2016  . Overweight   . Pain management contract agreement 08/01/2015   Overview:  [facility]MENT AGREEMENT; Read, reviewed and signed. Copy given to patient. Original to Medical Records. PCG 09/02/2016  . Prediabetes   . Reflux esophagitis   . Sacrococcygeal disorders, not elsewhere classified   . Sacroiliac joint dysfunction 04/05/2015  . Slow transit constipation 09/12/2016  . 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 Trochanteric bursitis of left hip   . Ulnar neuropathy   . Urinary frequency   . Vaginal atrophy    Past Surgical History:  Procedure Laterality Date  . ABDOMINAL HYSTERECTOMY    . arm surgery Left   . CESAREAN SECTION    . JOINT REPLACEMENT Left 2016   dec  . TOTAL HIP ARTHROPLASTY Left 12/26/2014   Procedure: TOTAL HIP ARTHROPLASTY ANTERIOR APPROACH;  Surgeon: Kennedy Bucker, MD;  Location: [facility] ORS;  Service: Orthopedics;  Laterality: Left;   Family History  Problem Relation Age of Onset  . Alzheimer's disease Mother   . Arthritis Mother   . Stroke Father   . Hypertension Father   . Cancer Father   . Colon cancer Sister   . Hypertension Sister   . Kidney disease Neg Hx    Social History   Occupational History  . disabled    Social History Main Topics  . Smoking status: Former Smoker    Packs/day: 1.00    Types: Cigarettes    Quit date: 03/01/1995  . Smokeless tobacco: Never Used     Comment: quit 1998  . Alcohol use No  . Drug use: No     Comment: Former hx of Crack Cocaine quit 1998  . Sexual activity: Not on  file    Tobacco Counseling Counseling given: Not Answered   Activities of Daily Living In your present state of health, do you have any difficulty performing the following activities: 10/14/2016 09/23/2016  Hearing? N N  Vision? N N  Difficulty concentrating or making decisions? Malvin Johns  Walking or climbing stairs? Y Y  Dressing or bathing? N N  Doing errands, shopping? N Y  Quarry manager and eating ? N Y  Using the Toilet? N N  In the past six months, have you accidently leaked urine? Y N  Comment takes meds for overactive bladder -  Do you have problems with loss of bowel control? N N  Managing your Medications? N Y  Managing your Finances? N N  Housekeeping or managing your Housekeeping? Y N  Comment has assistance  -  Some recent data might be hidden    Immunizations and Health Maintenance Immunization History  Administered Date(s) Administered  . Influenza, High Dose Seasonal PF 10/14/2016  . Pneumococcal Conjugate-13 10/14/2016  . Tdap 06/19/2010   Health Maintenance Due  Topic Date Due  . PAP SMEAR  07/12/1972    Patient Care Team: Galen Manila, NP as PCP - General (Nurse Practitioner) Katheren Puller, MD as Referring Physician (Psychiatry) Della Goo, RN as [facility], [place], [place] as [facility] Care Management  Indicate any recent Medical Services you may have received from other than [facility] providers in the past year (date may be approximate).     Assessment:   This is a routine wellness examination for [place].   Hearing/Vision screen Vision Screening Comments: Goes to my eye doctor annually  Dietary issues and exercise activities discussed: Current Exercise Habits: The patient does not participate in regular exercise at present, Exercise limited by: cardiac condition(s)  Goals    None     Depression Screen PHQ 2/9 Scores 10/14/2016 09/23/2016 04/05/2015 12/07/2014 11/20/2014  10/31/2014 06/29/2014  PHQ - 2 Score 6 2 0 6 - 0 0  PHQ- 9 Score 21 7 - - - - -  Exception Documentation - - - - Patient refusal (No Data) Medical reason    Fall Risk Fall Risk  10/14/2016 09/23/2016 09/11/2016 04/05/2015 12/07/2014  Falls in the past year? Yes Yes Yes No No  Number falls in past yr: 2 or more 2 or more - - -  Injury with Fall? No No - - -  Risk Factor Category  High Fall Risk - - - -  Risk for fall due to : Impaired mobility;Impaired balance/gait;History of fall(s) Impaired balance/gait Impaired balance/gait;Mental status change - -  Follow up Falls prevention discussed Falls prevention discussed - - -    Cognitive Function:     6CIT Screen 10/14/2016  What Year? 0 points  What month? 0 points  What time? 0 points  Count back from 20 0 points  Months in reverse 0 points  Repeat phrase 4 points  Total Score 4    Screening Tests Health Maintenance  Topic Date Due  . PAP SMEAR  07/12/1972  . DEXA SCAN  10/04/2017 (Originally 07/12/2016)  . MAMMOGRAM  10/03/2017  . PNA vac Low Risk Adult (2 of 2 - PPSV23) 10/14/2017  . COLONOSCOPY  10/05/2019  . TETANUS/TDAP  06/18/2020  . INFLUENZA VACCINE  Completed  . Hepatitis C Screening  Completed  . HIV Screening  Completed      Plan:    I have personally reviewed and addressed the Medicare Annual Wellness questionnaire and have noted the following in the patient's chart:  A. Medical and social history B. Use of alcohol, tobacco or illicit drugs  C. Current medications and supplements D. Functional ability and status E.  Nutritional status F.  Physical activity G. Advance directives H. List of other physicians I.  Hospitalizations, surgeries, and ER visits in previous 12 months J.  Vitals K. Screenings such as hearing and vision if needed, cognitive and depression L. Referrals and appointments  In addition, I have reviewed and discussed with patient certain preventive protocols, quality metrics, and best practice  recommendations. A written personalized care plan for preventive services as well as general preventive health recommendations were provided to patient.   Signed,  Marin Roberts, LPN Nurse Health Advisor   MD Recommendations: due for pap smear- doesn't have a GYN.
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
## 2016-10-15 ENCOUNTER — Encounter: Payer: Self-pay | Admitting: Nurse Practitioner

## 2016-10-15 ENCOUNTER — Ambulatory Visit (INDEPENDENT_AMBULATORY_CARE_PROVIDER_SITE_OTHER): Payer: Medicare HMO | Admitting: Nurse Practitioner

## 2016-10-15 VITALS — BP 127/57 | HR 91 | Temp 98.6°F | Ht 67.0 in | Wt 170.4 lb

## 2016-10-15 DIAGNOSIS — H1013 Acute atopic conjunctivitis, bilateral: Secondary | ICD-10-CM | POA: Diagnosis not present

## 2016-10-15 DIAGNOSIS — E2740 Unspecified adrenocortical insufficiency: Secondary | ICD-10-CM | POA: Diagnosis not present

## 2016-10-15 DIAGNOSIS — Z09 Encounter for follow-up examination after completed treatment for conditions other than malignant neoplasm: Secondary | ICD-10-CM

## 2016-10-15 DIAGNOSIS — F3160 Bipolar disorder, current episode mixed, unspecified: Secondary | ICD-10-CM | POA: Diagnosis not present

## 2016-10-15 DIAGNOSIS — F3175 Bipolar disorder, in partial remission, most recent episode depressed: Secondary | ICD-10-CM | POA: Diagnosis not present

## 2016-10-15 MED ORDER — ACYCLOVIR 400 MG PO TABS: 400.0000 mg | ORAL_TABLET | Freq: Every day | ORAL | 2 refills | Status: AC

## 2016-10-15 MED ORDER — AZELASTINE HCL 0.05 % OP SOLN: 1.0000 [drp] | Freq: Two times a day (BID) | OPHTHALMIC | 3 refills | Status: AC

## 2016-10-15 MED ORDER — SERTRALINE HCL 100 MG PO TABS: 200.0000 mg | ORAL_TABLET | Freq: Every day | ORAL | 1 refills | Status: AC

## 2016-10-15 NOTE — Progress Notes (Addendum)
Subjective:    Patient ID: Kelly Foster, female    DOB: February 13, 1950, 66 y.o.   MRN: [id number]  Kelly Foster is a 66 y.o. female presenting on 10/15/2016 for Hospitalization Follow-up (hypotension)   HPI   Hospital f/u Adrenal Insufficiency at [facility] Admission Date: 10/04/16 Discharge Date: 10/06/16 Transitions of care telephone call: 10/09/16 Diagnosis: Adrenal Insufficiency Follow up recommendations: office visit to reassess BP outpatient w/ PCP and w/ endocrinology. Interval history: Pt has had repeat syncope on 9/4 where she presented to clinic w/o an appointment w/ BP reading of 58/36.  EMS was called and patient went to ED for urgent treatment.  It was identified that patient had not gotten her higher dose of hydrocortisone at that time.  Pt was evaluated, treated in ED, and returned home w/ stable BP.  She received her correct dose of hydrocortisone and has been feeling "real good." - Pt states she is "happy.  Feels good.  Feels real good." - No nausea, no dizziness, no lightheadedness.  Appetite is returning and she is able to eat.  Unfortunately, now has recurrent thrush for which she was treated yesterday.   Bipolar Disorder on Depakote Next appointment w/ Dr. Cherylann Ratel for bipolar management in a few weeks.    No recent depakote level check.  Will evaluate today w/ labs.  Pt states was restarted on amitriptyline as well.  Had previously been stopped 2/2 hyponatremia as possible contributor.     Eye Itchiness Pt w/ persistent eye itchiness w/ known allergic conjunctivitis.  Alaway is too expensive for pt OTC and prefers to resume Pataday.  Social History  Substance Use Topics  . Smoking status: Former Smoker    Packs/day: 1.00    Types: Cigarettes    Quit date: 03/01/1995  . Smokeless tobacco: Never Used     Comment: quit 1998  . Alcohol use No    Review of Systems Per HPI unless specifically indicated above   Outpatient Encounter Prescriptions as of  10/15/2016  Medication Sig Note  . acyclovir (ZOVIRAX) 400 MG tablet Take 1 tablet (400 mg total) by mouth daily. (Patient not taking: Reported on 10/16/2016) 10/16/2016: Per pt been out for 3 days, to pick up prescriptions.   Marland Kitchen amitriptyline (ELAVIL) 50 MG tablet TK 1 T PO QHS   . aspirin 81 MG tablet Take 1 tablet (81 mg total) by mouth daily.   Marland Kitchen BENZOCAINE, DENTAL, 20 % LIQD Use as directed in the mouth or throat. Daily as needed. 09/23/2016: As needed.   . Calcium Carbonate-Vitamin D3 (CALCIUM 600-D) 600-400 MG-UNIT TABS Take 1 tablet by mouth daily.   . divalproex (DEPAKOTE ER) 500 MG 24 hr tablet TK 2 TS PO HS   . HYDROcodone-acetaminophen (NORCO/VICODIN) 5-325 MG tablet Take 1 tablet by mouth 2 (two) times daily. 09/23/2016: Pt taking twice a day   . hydrocortisone (CORTEF) 5 MG tablet Take 1 tablet (5 mg total) by mouth daily. 10/09/2016: 3 tablet in am, one at night   . hydrocortisone (PROCTOSOL HC) 2.5 % rectal cream  10/16/2016: As needed.   . magic mouthwash SOLN 1 Part Lidocaine 1 Part diphenhydramine HCL 1 Part Maalox 1 Part Nystatin  Swish, gargle, and spit.  Take 4 times daily for thrush.   . mometasone (NASONEX) 50 MCG/ACT nasal spray Place 2 sprays into the nose daily. (Patient not taking: Reported on 10/16/2016)   . montelukast (SINGULAIR) 10 MG tablet Take 1 tablet (10 mg total) by mouth at  bedtime. Reported on 04/05/2015   . Multiple Vitamin (MULTIVITAMIN) tablet Take 1 tablet by mouth daily.   Marland Kitchen senna (SENOKOT) 8.6 MG tablet Take 1 tablet (8.6 mg total) by mouth daily.   . simvastatin (ZOCOR) 40 MG tablet Take 1 tablet (40 mg total) by mouth at bedtime. 10/14/2016: hasnt been able to get  . [DISCONTINUED] acyclovir (ZOVIRAX) 400 MG tablet Take 1 tablet (400 mg total) by mouth 3 (three) times daily as needed (for flare of genital warts, cold sore). 09/15/2016: Pt does not have   . [DISCONTINUED] albuterol (PROVENTIL HFA;VENTOLIN HFA) 108 (90 Base) MCG/ACT inhaler Inhale 2 puffs into  the lungs every 6 (six) hours as needed for wheezing or shortness of breath. 09/23/2016: Per pt taking twice a day   . [DISCONTINUED] ARTIFICIAL TEARS 0.1-0.3 % SOLN Administer 1 drop to both eyes 4 (four) times a day as needed. 09/23/2016: Pt taking twice a day   . [DISCONTINUED] nystatin (MYCOSTATIN) 100000 UNIT/ML suspension Take 5 mLs (500,000 Units total) by mouth 4 (four) times daily.   . [DISCONTINUED] Spacer/Aero Chamber Mouthpiece MISC by Does not apply route. 09/15/2016: Per son, does not have   . azelastine (OPTIVAR) 0.05 % ophthalmic solution Place 1 drop into both eyes 2 (two) times daily.   . sertraline (ZOLOFT) 100 MG tablet Take 2 tablets (200 mg total) by mouth at bedtime.    No facility-administered encounter medications on file as of 10/15/2016.       Objective:    BP (!) 127/57   Pulse 91   Temp 98.6 F (37 C) (Oral)   Ht  (1.702 m)   Wt 170 lb 6.4 oz (77.3 kg)   SpO2 100%   BMI 26.69 kg/m   Wt Readings from Last 3 Encounters:  10/16/16 170 lb (77.1 kg)  10/15/16 170 lb 6.4 oz (77.3 kg)  10/14/16 169 lb (76.7 kg)    Physical Exam  General - frail, well-appearing, NAD HEENT - Normocephalic, atraumatic, PERRL, EOMI, patent nares w/o congestion, oropharynx clear, MMM, tongue erythematous w/o white patches (improvement in 1 day after Magic Mouthwash) Neck - supple, non-tender, no LAD Heart - RRR, no murmurs heard Lungs - Clear throughout all lobes, no wheezing, crackles, or rhonchi, normal work of breathing. Abdomen - soft, NTND, no masses, no hepatosplenomegaly, active bowel sounds Extremeties - non-tender, no edema, cap refill < 2 seconds, peripheral pulses intact +2 bilaterally Skin - warm, dry, no rashes Neuro - awake, alert, oriented x3, improved but still shuffling feet w/ gait. Improved heel to toe rolling action.  Persistent mouth movements (unchanged). Psych - Normal mood and affect, normal behavior    Results for orders placed or performed in visit on  10/15/16  Comprehensive metabolic panel  Result Value Ref Range   Glucose, Bld 89 65 - 139 mg/dL   BUN 10 7 - 25 mg/dL   Creat 4.09 8.11 - 9.14 mg/dL   BUN/Creatinine Ratio NOT APPLICABLE 6 - 22 (calc)   Sodium 128 (L) 135 - 146 mmol/L   Potassium 3.3 (L) 3.5 - 5.3 mmol/L   Chloride 90 (L) 98 - 110 mmol/L   CO2 28 20 - 32 mmol/L   Calcium 9.1 8.6 - 10.4 mg/dL   Total Protein 6.4 6.1 - 8.1 g/dL   Albumin 3.0 (L) 3.6 - 5.1 g/dL   Globulin 3.4 1.9 - 3.7 g/dL (calc)   AG Ratio 0.9 (L) 1.0 - 2.5 (calc)   Total Bilirubin 0.6 0.2 - 1.2 mg/dL  Alkaline phosphatase (APISO) 98 33 - 130 U/L   AST 60 (H) 10 - 35 U/L   ALT 44 (H) 6 - 29 U/L  Valproic Acid level  Result Value Ref Range   Valproic Acid Lvl 30.8 (L) 50.0 - 100.0 mg/L      Assessment & Plan:   Problem List Items Addressed This Visit      Endocrine   Chronic adrenal insufficiency [facility](HCC) Hospital discharge follow up    Pt w/ chronic adrenal insufficiency w/ recent hospitalization for hypotension and subsequent ED visit for hypotension.  Pt did not change home dose of hydrocortisone as instructed after hospitalization.  Today BP and symptoms of adrenal insufficiency are well controlled.  She had been taken off this medication prior to initiating care with me about 4 months ago.  Plan: 1. Hydrocortisone 15 mg once every am and 5 mg every pm.  Continue at this dose - confirmed that pt is truly taking correct dose. 2. Pt needs endocrinology referrals in process. 3. Recheck CMP. 4. Follow up 1 month.      Relevant Orders   Comprehensive metabolic panel (Completed)     Other   Bipolar 1 disorder, mixed (HCC)    Seems to be controlled currently.  Pt on amitriptyline, depakote, and sertraline.  Concern for amitriptyline contributing to hyponatremia.  However, moods seem stable today w/ pt back on amitriptyline. Will defer decisions to Dr. Cherylann RatelLateef.  Plan: 1. Recheck Depakote level w/ labs today as pt has had inconsistent levels  previously. 2. Request prior notes from Dr. Cherylann RatelLateef. 3. Follow up as needed and keep appointments w/ Dr. Cherylann RatelLateef.      Relevant Medications   sertraline (ZOLOFT) 100 MG tablet   Other Relevant Orders   Valproic Acid level (Completed)   Allergic conjunctivitis of both eyes - Primary    Curently stable.  Pt on alaway but is cost prohibitive.  Wants to resume pataday.  Plan: 1. Order placed for Optivar as this medication is on pt formulary. 2. Follow up 1 month.      Relevant Medications   azelastine (OPTIVAR) 0.05 % ophthalmic solution      Meds ordered this encounter  Medications  . acyclovir (ZOVIRAX) 400 MG tablet    Sig: Take 1 tablet (400 mg total) by mouth daily.    Dispense:  60 tablet    Refill:  2  . azelastine (OPTIVAR) 0.05 % ophthalmic solution    Sig: Place 1 drop into both eyes 2 (two) times daily.    Dispense:  18 mL    Refill:  3  . sertraline (ZOLOFT) 100 MG tablet    Sig: Take 2 tablets (200 mg total) by mouth at bedtime.    Dispense:  180 tablet    Refill:  1    I have reviewed the discharge medication list, and have reconciled the current and discharge medications today.  Follow up plan: Return in about 1 month (around 11/14/2016) for adrenal insufficiency.  Wilhelmina McardleLauren Aarionna Germer, DNP, AGPCNP-BC Adult Gerontology Primary Care Nurse Practitioner [facility] 10/23/2016, 1:26 PM

## 2016-10-15 NOTE — Patient Instructions (Addendum)
Ms. Harlow Mareseggy Sanroman, Thank you for coming in to clinic today.  1. For your Bipolar: - Make sure you go see Dr. Cherylann RatelLateef and Herbert SetaHeather this month.  Ask them to review your depakote, amitriptyline, and sertraline.  Please fax your most recent note and plan to Wilhelmina McardleLauren Kourtney Terriquez, AGNP at [facility] Phone: [phone] Fax: [phone].  2. For your adrenal insufficiency: - KEEP taking hydrocortisone 5 mg tablets: take 3 in the morning, 1 in the afternoon. This medicine is what makes you feel better, keeps you from falling, and keeps your sodium normal.  -IF you start feeling badly with any nausea or dizziness, please call clinic.  3.  For your eyes: - STOP alaway eye drop when you run out. - START pataday eye drop after you stop alaway.   Use one drop in each eye twice a day.  No extra drops.  Please schedule a follow-up appointment with Wilhelmina McardleLauren Odesser Tourangeau, AGNP. Return in about 1 month (around 11/14/2016) for adrenal insufficiency.  If you have any other questions or concerns, please feel free to call the clinic or send a message through MyChart. You may also schedule an earlier appointment if necessary.  You will receive a survey after today's visit either digitally by e-mail or paper by [employer] mail. Your experiences and feedback matter to us.  Please respond so we know how we are doing as we provide care for you.   Wilhelmina McardleLauren Shermar Friedland, DNP, AGNP-BC Adult Gerontology Nurse Practitioner [facility], [facility]

## 2016-10-16 ENCOUNTER — Inpatient Hospital Stay: Payer: Self-pay | Admitting: Nurse Practitioner

## 2016-10-16 ENCOUNTER — Other Ambulatory Visit: Payer: Self-pay | Admitting: *Deleted

## 2016-10-16 LAB — VALPROIC ACID LEVEL: Valproic Acid Lvl: 30.8 mg/L — ABNORMAL LOW (ref 50.0–100.0)

## 2016-10-16 LAB — COMPREHENSIVE METABOLIC PANEL
AG Ratio: 0.9 (calc) — ABNORMAL LOW (ref 1.0–2.5)
ALT: 44 U/L — ABNORMAL HIGH (ref 6–29)
AST: 60 U/L — ABNORMAL HIGH (ref 10–35)
Albumin: 3 g/dL — ABNORMAL LOW (ref 3.6–5.1)
Alkaline phosphatase (APISO): 98 U/L (ref 33–130)
BUN: 10 mg/dL (ref 7–25)
CO2: 28 mmol/L (ref 20–32)
Calcium: 9.1 mg/dL (ref 8.6–10.4)
Chloride: 90 mmol/L — ABNORMAL LOW (ref 98–110)
Creat: 0.82 mg/dL (ref 0.50–0.99)
Globulin: 3.4 g/dL (calc) (ref 1.9–3.7)
Glucose, Bld: 89 mg/dL (ref 65–139)
Potassium: 3.3 mmol/L — ABNORMAL LOW (ref 3.5–5.3)
Sodium: 128 mmol/L — ABNORMAL LOW (ref 135–146)
Total Bilirubin: 0.6 mg/dL (ref 0.2–1.2)
Total Protein: 6.4 g/dL (ref 6.1–8.1)
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
## 2016-10-17 ENCOUNTER — Other Ambulatory Visit: Payer: Self-pay | Admitting: *Deleted

## 2016-10-17 NOTE — Patient Outreach (Signed)
[facility]) Care Management  10/17/2016  Kelly Foster 1951/01/02 [id number]   Phone call to patient to assess for social work needs.  HIPPA compliant voicemail message left for a return call.   Adriana Reams [facility] Care Management [phone]

## 2016-10-20 ENCOUNTER — Other Ambulatory Visit: Payer: Self-pay | Admitting: *Deleted

## 2016-10-20 ENCOUNTER — Other Ambulatory Visit: Payer: Self-pay

## 2016-10-20 ENCOUNTER — Telehealth: Payer: Self-pay | Admitting: Nurse Practitioner

## 2016-10-20 ENCOUNTER — Other Ambulatory Visit: Payer: Self-pay | Admitting: Nurse Practitioner

## 2016-10-20 MED ORDER — OMEPRAZOLE 20 MG PO CPDR: 20.0000 mg | DELAYED_RELEASE_CAPSULE | Freq: Every day | ORAL | 1 refills | Status: AC

## 2016-10-20 NOTE — Telephone Encounter (Signed)
Pt has a question about medication [phone] °

## 2016-10-20 NOTE — Patient Outreach (Signed)
[facility]) Care Management  10/20/2016  Kelly Foster November 02, 1950 [id number]   Phone call to patient to assess for community resource needs at the request of patient's primary care doctor.  Voicemail message left for  Return call.   Adriana Reams [facility] Care Management [phone]

## 2016-10-20 NOTE — Telephone Encounter (Signed)
Attempted to contact the pt, no answer. LMOM to return my call.  

## 2016-10-23 ENCOUNTER — Telehealth: Payer: Self-pay

## 2016-10-23 ENCOUNTER — Telehealth: Payer: Self-pay | Admitting: Nurse Practitioner

## 2016-10-23 ENCOUNTER — Other Ambulatory Visit: Payer: Self-pay | Admitting: *Deleted

## 2016-10-23 ENCOUNTER — Ambulatory Visit: Payer: Self-pay | Admitting: *Deleted

## 2016-10-23 DIAGNOSIS — H1013 Acute atopic conjunctivitis, bilateral: Secondary | ICD-10-CM | POA: Insufficient documentation

## 2016-10-23 NOTE — Assessment & Plan Note (Signed)
Seems to be controlled currently.  Pt on amitriptyline, depakote, and sertraline.  Concern for amitriptyline contributing to hyponatremia.  However, moods seem stable today w/ pt back on amitriptyline. Will defer decisions to Dr. Cherylann Ratel.  Plan: 1. Recheck Depakote level w/ labs today as pt has had inconsistent levels previously. 2. Request prior notes from Dr. Cherylann Ratel. 3. Follow up as needed and keep appointments w/ Dr. Cherylann Ratel.

## 2016-10-23 NOTE — Telephone Encounter (Signed)
The pt called requesting for something to be called in for swelling in her face and lips. She also stated that its hard to swallow. I advise the pt to go to the hospital, because she could be having a allergic reaction that could be causing the swelling. She verbalize understanding. The pt told me that her sister was with her.

## 2016-10-23 NOTE — Patient Outreach (Signed)
[facility]) Care Management  10/23/2016  Kelly Foster 1950/03/26 [id number]    Phone call to patient to assess for community resource needs at the request of patient's primary care doctor.  Voicemail message left for a return call.    Adriana Reams [facility] Care Management [phone]

## 2016-10-23 NOTE — Assessment & Plan Note (Signed)
Pt w/ chronic adrenal insufficiency w/ recent hospitalization for hypotension and subsequent ED visit for hypotension.  Pt did not change home dose of hydrocortisone as instructed after hospitalization.  Today BP and symptoms of adrenal insufficiency are well controlled.  She had been taken off this medication prior to initiating care with me about 4 months ago.  Plan: 1. Hydrocortisone 15 mg once every am and 5 mg every pm.  Continue at this dose - confirmed that pt is truly taking correct dose. 2. Pt needs endocrinology referrals in process. 3. Recheck CMP. 4. Follow up 1 month.

## 2016-10-23 NOTE — Assessment & Plan Note (Signed)
Curently stable.  Pt on alaway but is cost prohibitive.  Wants to resume pataday.  Plan: 1. Order placed for Optivar as this medication is on pt formulary. 2. Follow up 1 month.

## 2016-10-23 NOTE — Telephone Encounter (Signed)
Pt said she has a really bad sore throat and asked to have something called in [phone]

## 2016-10-24 DIAGNOSIS — Z5321 Procedure and treatment not carried out due to patient leaving prior to being seen by health care provider: Secondary | ICD-10-CM | POA: Diagnosis not present

## 2016-10-28 ENCOUNTER — Telehealth: Payer: Self-pay | Admitting: Nurse Practitioner

## 2016-10-28 DIAGNOSIS — E2740 Unspecified adrenocortical insufficiency: Secondary | ICD-10-CM

## 2016-10-28 DIAGNOSIS — K5901 Slow transit constipation: Secondary | ICD-10-CM

## 2016-10-28 MED ORDER — HYDROCORTISONE 5 MG PO TABS: ORAL_TABLET | ORAL | 0 refills | Status: AC

## 2016-10-28 NOTE — Telephone Encounter (Signed)
Sent refill for hydrocortiscone.  Pt already had refills remaining on all other medications.  She needs to call her pharmacy to have the refills filled.

## 2016-10-28 NOTE — Telephone Encounter (Signed)
Pt needs refills on montelukast, omeprazole, simvastatin, senokot and hydrocortison sent the [facility] in [place].  Her call back number is [phone]

## 2016-10-28 NOTE — Telephone Encounter (Signed)
The pt was notified. No questions or concerns. 

## 2016-10-29 ENCOUNTER — Other Ambulatory Visit: Payer: Self-pay | Admitting: *Deleted

## 2016-10-29 ENCOUNTER — Telehealth: Payer: Self-pay | Admitting: Nurse Practitioner

## 2016-10-29 NOTE — Patient Outreach (Signed)
Unsuccessful telephone encounter to Fatimata Talsma, 66 year old female for transition of care/recent hospitalization September  1-3,2018 at [facility] for hypotension, adrenal insufficiency as well as view in Epic today saw  pt had a ED visit 10/24/16 at [facility] for mouth pain.  HIPAA compliant voice message left with contact name and number.   Plan:  If no response to voice message left, plan to follow up again within the next 2 days.   Shayne Alken.   Pierzchala RN CCM [facility] Care Management  [phone]

## 2016-10-29 NOTE — Telephone Encounter (Signed)
Pt called EMS last night for sore throat and thrush.  She asked to have thrush medication sent to pharmacy [phone]

## 2016-10-30 ENCOUNTER — Other Ambulatory Visit: Payer: Self-pay | Admitting: *Deleted

## 2016-10-30 ENCOUNTER — Encounter: Payer: Self-pay | Admitting: *Deleted

## 2016-10-30 ENCOUNTER — Telehealth: Payer: Self-pay | Admitting: Nurse Practitioner

## 2016-10-30 MED ORDER — NYSTATIN 100000 UNIT/ML MT SUSP: 5.0000 mL | Freq: Four times a day (QID) | OROMUCOSAL | 0 refills | Status: AC

## 2016-10-30 NOTE — Patient Outreach (Signed)
Unsuccessful telephone encounter to Baylin Cabal, 66 year old female for transition of care/ongoing follow up on recent hospitalization September 1-3,2018 at [facility] for hypotension, adrenal insufficiency as well as view in Epic pt had a ED visit 10/24/16 at [facility] for mouth pain.  This RN CM did receive a voice message from pt late afternoon  yesterday in response to call made by RN CM earlier but message was not viewed until after hours.   HIPAA compliant voice message left with contact name and number requesting a return phone call.    Plan:  If no response to voice message left, plan to follow up again within the next 4 days.   Shayne Alken.   Pierzchala RN CCM [facility] Care Management  [phone]

## 2016-10-30 NOTE — Telephone Encounter (Signed)
Pt called again expressing difficulty w/ thrush.  Tongue and lower gums are "coated white."  She has completed her magic mouthwash course of treatment w/o significant improvement.   Start nystatin swish and swallow qid x 7 days.  Pt states she is going to check herself into [facility] tonight at 7pm. Verbalized to pt that our recommendation is she needs to see [facility] ENT, however does not require ED visit.  Pt will wait until she gets a call to have an office visit and will contact our clinic between now and that time.  - Pt verbalizes understanding.

## 2016-10-30 NOTE — Telephone Encounter (Signed)
Referral faxed over to [facility] ENT and I contacted there office to verify they received the referral. I was informed that they will contact the pt and schedule an appt.

## 2016-10-31 ENCOUNTER — Ambulatory Visit: Payer: Self-pay | Admitting: *Deleted

## 2016-11-03 ENCOUNTER — Other Ambulatory Visit: Payer: Self-pay | Admitting: *Deleted

## 2016-11-03 NOTE — Patient Outreach (Signed)
Received  A call from pt, HIPAA verified as this RN has been trying to reach pt  for transition of care/ongoing follow up on recent hospitalization September 1-3, 2018 at [facility] for hypotension, adrenal insufficiency.   Pt reports doing okay, son's girlfriend and 2 children are out of her house/left yesterday, son remains.  RN CM discussed with pt recent ED visit 10/24/16 at [facility] (viewed in Epic) for mouth pain.   Pt reports she still has mouth pain, bad this am, recently took medications, about to take Tylenol when get off the phone.  Pt reports taking taking all medications as ordered, does her own pill planner. Pt reports no recent falls, no more dizziness, BP up and down.      Plan:  As discussed with pt, plan to follow up again next week (final transition of care call)      Telephonically.    Shayne Alken.   Juliona Vales RN CCM [facility] Care Management  [phone]

## 2016-11-04 ENCOUNTER — Telehealth: Payer: Self-pay

## 2016-11-04 ENCOUNTER — Ambulatory Visit: Payer: Self-pay | Admitting: *Deleted

## 2016-11-04 NOTE — Telephone Encounter (Signed)
We reviewed symptoms and other information Kelly Foster provided together.  Vomiting w/ brown emesis is a possible emergency.  Also needs to make sure she is still taking her hydrocortisone correctly.  Would prefer 9-1-1 call and possible transport to ED via EMS as is difficult to assess severity of symptoms on phone.  Can also come to clinic for appointment in the next 2 days if not severe.

## 2016-11-04 NOTE — Telephone Encounter (Signed)
Pt reports vomiting for 2 days. Not able to keep anything down and feeling really weak. I informed the pt she needs to go to the hospital. She verbalize understanding.

## 2016-11-04 NOTE — Telephone Encounter (Signed)
The pt called crying stating she can't keep anything down. Every time she eat something she vomits it right back up. Please advise

## 2016-11-05 ENCOUNTER — Other Ambulatory Visit: Payer: Self-pay | Admitting: *Deleted

## 2016-11-05 NOTE — Patient Outreach (Signed)
Late entry for 11/04/16-  4:41 pm received a call from pt, HIPAA verified (name,date of birth) reports being  sick,had emesis 2 days does not know what to do.   RN CM discussed with pt to call PCP and report symptoms to which pt agreed to do.     Shayne Alken.   Aubreyana Saltz RN CCM [facility] Care Management  [phone]

## 2016-11-06 ENCOUNTER — Other Ambulatory Visit: Payer: Self-pay | Admitting: *Deleted

## 2016-11-06 ENCOUNTER — Other Ambulatory Visit: Payer: Self-pay

## 2016-11-06 ENCOUNTER — Ambulatory Visit: Payer: Self-pay | Admitting: *Deleted

## 2016-11-06 NOTE — Patient Outreach (Addendum)
[facility]) Care Management  11/06/2016  Sady Monaco Sep 02, 1950 [id number]   Patient referred to this social worker to assess for in home support needs and to assess for a higher level of care. Fourth attempt made to contact patient without success, however RNCM [name] Pierzchala has been in contact with patient. Per RNCM, patient's home life has stabilized since her son's girlfriend and her 2 children have moved.  Patient's son remains in the home until December.  Patient has no level of care needs at this time. Per RNCM, patient is stable and able to manage her own medicines. Patient to be closed to [facility] social work.     Adriana Reams [facility] Care Management [phone]

## 2016-11-07 ENCOUNTER — Emergency Department: Payer: Medicare HMO

## 2016-11-07 ENCOUNTER — Telehealth: Payer: Self-pay

## 2016-11-07 ENCOUNTER — Inpatient Hospital Stay
Admission: EM | Admit: 2016-11-07 | Discharge: 2016-12-04 | DRG: 871 | Disposition: E | Payer: Medicare HMO | Attending: Internal Medicine | Admitting: Internal Medicine

## 2016-11-07 ENCOUNTER — Encounter: Payer: Self-pay | Admitting: Emergency Medicine

## 2016-11-07 DIAGNOSIS — Z823 Family history of stroke: Secondary | ICD-10-CM

## 2016-11-07 DIAGNOSIS — I471 Supraventricular tachycardia: Secondary | ICD-10-CM | POA: Diagnosis present

## 2016-11-07 DIAGNOSIS — J9600 Acute respiratory failure, unspecified whether with hypoxia or hypercapnia: Secondary | ICD-10-CM

## 2016-11-07 DIAGNOSIS — R579 Shock, unspecified: Secondary | ICD-10-CM

## 2016-11-07 DIAGNOSIS — R7303 Prediabetes: Secondary | ICD-10-CM | POA: Diagnosis present

## 2016-11-07 DIAGNOSIS — Z7982 Long term (current) use of aspirin: Secondary | ICD-10-CM | POA: Diagnosis not present

## 2016-11-07 DIAGNOSIS — E861 Hypovolemia: Secondary | ICD-10-CM | POA: Diagnosis present

## 2016-11-07 DIAGNOSIS — J189 Pneumonia, unspecified organism: Secondary | ICD-10-CM | POA: Diagnosis not present

## 2016-11-07 DIAGNOSIS — I10 Essential (primary) hypertension: Secondary | ICD-10-CM | POA: Diagnosis present

## 2016-11-07 DIAGNOSIS — E872 Acidosis: Secondary | ICD-10-CM | POA: Diagnosis not present

## 2016-11-07 DIAGNOSIS — R001 Bradycardia, unspecified: Secondary | ICD-10-CM | POA: Diagnosis present

## 2016-11-07 DIAGNOSIS — Z79899 Other long term (current) drug therapy: Secondary | ICD-10-CM | POA: Diagnosis not present

## 2016-11-07 DIAGNOSIS — Z87891 Personal history of nicotine dependence: Secondary | ICD-10-CM

## 2016-11-07 DIAGNOSIS — M1610 Unilateral primary osteoarthritis, unspecified hip: Secondary | ICD-10-CM | POA: Diagnosis present

## 2016-11-07 DIAGNOSIS — E039 Hypothyroidism, unspecified: Secondary | ICD-10-CM | POA: Diagnosis present

## 2016-11-07 DIAGNOSIS — E785 Hyperlipidemia, unspecified: Secondary | ICD-10-CM | POA: Diagnosis present

## 2016-11-07 DIAGNOSIS — G9341 Metabolic encephalopathy: Secondary | ICD-10-CM | POA: Diagnosis not present

## 2016-11-07 DIAGNOSIS — Z888 Allergy status to other drugs, medicaments and biological substances status: Secondary | ICD-10-CM

## 2016-11-07 DIAGNOSIS — R570 Cardiogenic shock: Secondary | ICD-10-CM | POA: Diagnosis present

## 2016-11-07 DIAGNOSIS — E876 Hypokalemia: Secondary | ICD-10-CM | POA: Diagnosis present

## 2016-11-07 DIAGNOSIS — E272 Addisonian crisis: Secondary | ICD-10-CM

## 2016-11-07 DIAGNOSIS — R0602 Shortness of breath: Secondary | ICD-10-CM | POA: Diagnosis not present

## 2016-11-07 DIAGNOSIS — Z820 Family history of epilepsy and other diseases of the nervous system: Secondary | ICD-10-CM

## 2016-11-07 DIAGNOSIS — R945 Abnormal results of liver function studies: Secondary | ICD-10-CM | POA: Diagnosis present

## 2016-11-07 DIAGNOSIS — F419 Anxiety disorder, unspecified: Secondary | ICD-10-CM | POA: Diagnosis present

## 2016-11-07 DIAGNOSIS — M5412 Radiculopathy, cervical region: Secondary | ICD-10-CM

## 2016-11-07 DIAGNOSIS — J9601 Acute respiratory failure with hypoxia: Secondary | ICD-10-CM | POA: Diagnosis present

## 2016-11-07 DIAGNOSIS — D72819 Decreased white blood cell count, unspecified: Secondary | ICD-10-CM | POA: Diagnosis present

## 2016-11-07 DIAGNOSIS — G894 Chronic pain syndrome: Secondary | ICD-10-CM | POA: Diagnosis present

## 2016-11-07 DIAGNOSIS — F319 Bipolar disorder, unspecified: Secondary | ICD-10-CM | POA: Diagnosis present

## 2016-11-07 DIAGNOSIS — K7200 Acute and subacute hepatic failure without coma: Secondary | ICD-10-CM | POA: Diagnosis present

## 2016-11-07 DIAGNOSIS — Z96642 Presence of left artificial hip joint: Secondary | ICD-10-CM | POA: Diagnosis present

## 2016-11-07 DIAGNOSIS — R6521 Severe sepsis with septic shock: Secondary | ICD-10-CM | POA: Diagnosis present

## 2016-11-07 DIAGNOSIS — R042 Hemoptysis: Secondary | ICD-10-CM | POA: Diagnosis not present

## 2016-11-07 DIAGNOSIS — I951 Orthostatic hypotension: Secondary | ICD-10-CM | POA: Diagnosis present

## 2016-11-07 DIAGNOSIS — D65 Disseminated intravascular coagulation [defibrination syndrome]: Secondary | ICD-10-CM | POA: Diagnosis present

## 2016-11-07 DIAGNOSIS — Z8249 Family history of ischemic heart disease and other diseases of the circulatory system: Secondary | ICD-10-CM

## 2016-11-07 DIAGNOSIS — Z885 Allergy status to narcotic agent status: Secondary | ICD-10-CM | POA: Diagnosis not present

## 2016-11-07 DIAGNOSIS — Z8261 Family history of arthritis: Secondary | ICD-10-CM

## 2016-11-07 DIAGNOSIS — A09 Infectious gastroenteritis and colitis, unspecified: Secondary | ICD-10-CM | POA: Diagnosis present

## 2016-11-07 DIAGNOSIS — A419 Sepsis, unspecified organism: Principal | ICD-10-CM | POA: Diagnosis present

## 2016-11-07 DIAGNOSIS — M4800 Spinal stenosis, site unspecified: Secondary | ICD-10-CM | POA: Diagnosis present

## 2016-11-07 DIAGNOSIS — Z87442 Personal history of urinary calculi: Secondary | ICD-10-CM

## 2016-11-07 DIAGNOSIS — I4891 Unspecified atrial fibrillation: Secondary | ICD-10-CM | POA: Diagnosis present

## 2016-11-07 DIAGNOSIS — E2740 Unspecified adrenocortical insufficiency: Secondary | ICD-10-CM | POA: Diagnosis not present

## 2016-11-07 DIAGNOSIS — Z800 Family history of malignant neoplasm of digestive organs: Secondary | ICD-10-CM

## 2016-11-07 DIAGNOSIS — Z79891 Long term (current) use of opiate analgesic: Secondary | ICD-10-CM

## 2016-11-07 DIAGNOSIS — Z90710 Acquired absence of both cervix and uterus: Secondary | ICD-10-CM

## 2016-11-07 DIAGNOSIS — J188 Other pneumonia, unspecified organism: Secondary | ICD-10-CM

## 2016-11-07 DIAGNOSIS — R000 Tachycardia, unspecified: Secondary | ICD-10-CM | POA: Diagnosis not present

## 2016-11-07 DIAGNOSIS — K210 Gastro-esophageal reflux disease with esophagitis: Secondary | ICD-10-CM | POA: Diagnosis present

## 2016-11-07 DIAGNOSIS — Z452 Encounter for adjustment and management of vascular access device: Secondary | ICD-10-CM | POA: Diagnosis not present

## 2016-11-07 LAB — BLOOD GAS, ARTERIAL
Acid-Base Excess: 21.3 mmol/L — ABNORMAL HIGH (ref 0.0–2.0)
BICARBONATE: 50.6 mmol/L — AB (ref 20.0–28.0)
FIO2: 1
MECHANICAL RATE: 14
MECHVT: 500 mL
O2 Saturation: 63 %
PEEP: 5 cmH2O
Patient temperature: 37
pCO2 arterial: 78 mmHg (ref 32.0–48.0)
pH, Arterial: 7.42 (ref 7.350–7.450)
pO2, Arterial: 32 mmHg — CL (ref 83.0–108.0)

## 2016-11-07 LAB — URINALYSIS, ROUTINE W REFLEX MICROSCOPIC
BACTERIA UA: NONE SEEN
BILIRUBIN URINE: NEGATIVE
Glucose, UA: NEGATIVE mg/dL
Hgb urine dipstick: NEGATIVE
KETONES UR: NEGATIVE mg/dL
Leukocytes, UA: NEGATIVE
Nitrite: NEGATIVE
PROTEIN: 30 mg/dL — AB
SQUAMOUS EPITHELIAL / LPF: NONE SEEN
Specific Gravity, Urine: 1.024 (ref 1.005–1.030)
pH: 5 (ref 5.0–8.0)

## 2016-11-07 LAB — BPAM FFP
BLOOD PRODUCT EXPIRATION DATE: 201810102359
BLOOD PRODUCT EXPIRATION DATE: 201810102359
UNIT TYPE AND RH: 7300
UNIT TYPE AND RH: 7300

## 2016-11-07 LAB — LACTIC ACID, PLASMA: LACTIC ACID, VENOUS: 8.6 mmol/L — AB (ref 0.5–1.9)

## 2016-11-07 LAB — MAGNESIUM: Magnesium: 1.3 mg/dL — ABNORMAL LOW (ref 1.7–2.4)

## 2016-11-07 LAB — URINE DRUG SCREEN, QUALITATIVE (ARMC ONLY)
AMPHETAMINES, UR SCREEN: NOT DETECTED
BENZODIAZEPINE, UR SCRN: NOT DETECTED
Barbiturates, Ur Screen: NOT DETECTED
CANNABINOID 50 NG, UR ~~LOC~~: NOT DETECTED
Cocaine Metabolite,Ur ~~LOC~~: NOT DETECTED
MDMA (Ecstasy)Ur Screen: NOT DETECTED
Methadone Scn, Ur: NOT DETECTED
OPIATE, UR SCREEN: POSITIVE — AB
PHENCYCLIDINE (PCP) UR S: NOT DETECTED
Tricyclic, Ur Screen: POSITIVE — AB

## 2016-11-07 LAB — FIBRIN DERIVATIVES D-DIMER (ARMC ONLY): Fibrin derivatives D-dimer (ARMC): 529.56 ng/mL (FEU) — ABNORMAL HIGH (ref 0.00–499.00)

## 2016-11-07 LAB — PHOSPHORUS: Phosphorus: 3.9 mg/dL (ref 2.5–4.6)

## 2016-11-07 LAB — GLUCOSE, CAPILLARY
GLUCOSE-CAPILLARY: 41 mg/dL — AB (ref 65–99)
GLUCOSE-CAPILLARY: 60 mg/dL — AB (ref 65–99)
Glucose-Capillary: 103 mg/dL — ABNORMAL HIGH (ref 65–99)
Glucose-Capillary: 106 mg/dL — ABNORMAL HIGH (ref 65–99)

## 2016-11-07 LAB — APTT

## 2016-11-07 LAB — TROPONIN I: Troponin I: 0.03 ng/mL (ref <0.03)

## 2016-11-07 LAB — PROTIME-INR

## 2016-11-07 LAB — FIBRINOGEN: Fibrinogen: <60 mg/dL — CL (ref 210–475)

## 2016-11-07 MED ORDER — ATROPINE SULFATE 1 MG/ML IJ SOLN
1.0000 mg | Freq: Once | INTRAMUSCULAR | Status: AC
  Administered 2016-11-07: 1 mg via INTRAVENOUS

## 2016-11-07 MED ORDER — SODIUM BICARBONATE 8.4 % IV SOLN
INTRAVENOUS | Status: AC
  Filled 2016-11-07: qty 100

## 2016-11-07 MED ORDER — DEXTROSE 10 % IV SOLN: INTRAVENOUS | Status: DC

## 2016-11-07 MED ORDER — POTASSIUM CHLORIDE 10 MEQ/50ML IV SOLN
10.0000 meq | INTRAVENOUS | Status: DC
Start: 2016-11-07 — End: 2016-11-08
  Filled 2016-11-07 (×6): qty 50

## 2016-11-07 MED ORDER — VANCOMYCIN HCL IN DEXTROSE 1-5 GM/200ML-% IV SOLN
1000.0000 mg | Freq: Once | INTRAVENOUS | Status: AC
  Administered 2016-11-07: 1000 mg via INTRAVENOUS
  Filled 2016-11-07: qty 200

## 2016-11-07 MED ORDER — GLUCAGON HCL RDNA (DIAGNOSTIC) 1 MG IJ SOLR
INTRAMUSCULAR | Status: AC
  Administered 2016-11-07: 1 mg via INTRAVENOUS
  Filled 2016-11-07: qty 1

## 2016-11-07 MED ORDER — SODIUM BICARBONATE 8.4 % IV SOLN
50.0000 meq | Freq: Once | INTRAVENOUS | Status: AC
  Administered 2016-11-07: 50 meq via INTRAVENOUS

## 2016-11-07 MED ORDER — LORAZEPAM 2 MG/ML IJ SOLN
0.5000 mg | Freq: Once | INTRAMUSCULAR | Status: AC
  Administered 2016-11-07: 0.5 mg via INTRAVENOUS

## 2016-11-07 MED ORDER — DEXTROSE 50 % IV SOLN
INTRAVENOUS | Status: AC
  Administered 2016-11-07: 25 mL via INTRAVENOUS
  Filled 2016-11-07: qty 50

## 2016-11-07 MED ORDER — SODIUM CHLORIDE 0.9 % IV BOLUS (SEPSIS)
1000.0000 mL | Freq: Once | INTRAVENOUS | Status: AC
  Administered 2016-11-07: 1000 mL via INTRAVENOUS

## 2016-11-07 MED ORDER — FENTANYL CITRATE (PF) 100 MCG/2ML IJ SOLN: 50.0000 ug | Freq: Once | INTRAMUSCULAR | Status: DC

## 2016-11-07 MED ORDER — SODIUM BICARBONATE 8.4 % IV SOLN
INTRAVENOUS | Status: AC | PRN
  Administered 2016-11-07: 25 meq via INTRAVENOUS
  Administered 2016-11-07: 50 meq via INTRAVENOUS

## 2016-11-07 MED ORDER — NOREPINEPHRINE BITARTRATE 1 MG/ML IV SOLN
0.0000 ug/min | Freq: Once | INTRAVENOUS | Status: AC
  Administered 2016-11-07: 15 ug/min via INTRAVENOUS
  Filled 2016-11-07: qty 4

## 2016-11-07 MED ORDER — DOPAMINE-DEXTROSE 3.2-5 MG/ML-% IV SOLN
0.0000 ug/kg/min | INTRAVENOUS | Status: DC
  Administered 2016-11-07: 20 ug/kg/min via INTRAVENOUS

## 2016-11-07 MED ORDER — LORAZEPAM 2 MG/ML IJ SOLN
INTRAMUSCULAR | Status: AC
  Administered 2016-11-07: 0.5 mg via INTRAVENOUS
  Filled 2016-11-07: qty 1

## 2016-11-07 MED ORDER — EPINEPHRINE PF 1 MG/10ML IJ SOSY
PREFILLED_SYRINGE | INTRAMUSCULAR | Status: AC | PRN
  Administered 2016-11-07 (×5): 1 via INTRAVENOUS

## 2016-11-07 MED ORDER — ATROPINE SULFATE 1 MG/ML IJ SOLN
INTRAMUSCULAR | Status: AC | PRN
  Administered 2016-11-07: 1 mg via INTRAVENOUS

## 2016-11-07 MED ORDER — FENTANYL CITRATE (PF) 100 MCG/2ML IJ SOLN: 50.0000 ug | INTRAMUSCULAR | Status: DC | PRN

## 2016-11-07 MED ORDER — BISACODYL 10 MG RE SUPP: 10.0000 mg | Freq: Every day | RECTAL | Status: DC | PRN

## 2016-11-07 MED ORDER — SENNOSIDES 8.8 MG/5ML PO SYRP: 5.0000 mL | ORAL_SOLUTION | Freq: Two times a day (BID) | ORAL | Status: DC | PRN

## 2016-11-07 MED ORDER — SODIUM BICARBONATE 8.4 % IV SOLN: 150.0000 meq | Freq: Once | INTRAVENOUS | Status: DC

## 2016-11-07 MED ORDER — IPRATROPIUM-ALBUTEROL 0.5-2.5 (3) MG/3ML IN SOLN: 3.0000 mL | Freq: Four times a day (QID) | RESPIRATORY_TRACT | Status: DC

## 2016-11-07 MED ORDER — LIDOCAINE HCL (PF) 1 % IJ SOLN
INTRAMUSCULAR | Status: AC
  Filled 2016-11-07: qty 5

## 2016-11-07 MED ORDER — SODIUM CHLORIDE 0.9 % IV SOLN: Freq: Once | INTRAVENOUS | Status: DC

## 2016-11-07 MED ORDER — ROCURONIUM BROMIDE 50 MG/5ML IV SOLN
INTRAVENOUS | Status: AC | PRN
  Administered 2016-11-07: 60 mg via INTRAVENOUS

## 2016-11-07 MED ORDER — DOPAMINE-DEXTROSE 3.2-5 MG/ML-% IV SOLN
INTRAVENOUS | Status: AC
  Filled 2016-11-07: qty 250

## 2016-11-07 MED ORDER — LIDOCAINE HCL (PF) 1 % IJ SOLN: 5.0000 mL | Freq: Once | INTRAMUSCULAR | Status: DC

## 2016-11-07 MED ORDER — DEXTROSE 50 % IV SOLN
25.0000 mL | Freq: Once | INTRAVENOUS | Status: AC
  Administered 2016-11-07: 25 mL via INTRAVENOUS

## 2016-11-07 MED ORDER — GLUCAGON HCL (RDNA) 1 MG IJ SOLR
1.0000 mg | Freq: Once | INTRAMUSCULAR | Status: AC
  Administered 2016-11-07: 1 mg via INTRAVENOUS
  Filled 2016-11-07: qty 1

## 2016-11-07 MED ORDER — DEXTROSE 5 % IV SOLN: 1.0000 g | Freq: Three times a day (TID) | INTRAVENOUS | Status: DC

## 2016-11-07 MED ORDER — NOREPINEPHRINE BITARTRATE 1 MG/ML IV SOLN: 0.0000 ug/min | INTRAVENOUS | Status: DC

## 2016-11-07 MED ORDER — MIDAZOLAM HCL 2 MG/2ML IJ SOLN: 1.0000 mg | INTRAMUSCULAR | Status: DC | PRN

## 2016-11-07 MED ORDER — SODIUM BICARBONATE 8.4 % IV SOLN
INTRAVENOUS | Status: DC
  Filled 2016-11-07 (×4): qty 150

## 2016-11-07 MED ORDER — SODIUM CHLORIDE 0.9 % IV BOLUS (SEPSIS)
500.0000 mL | Freq: Once | INTRAVENOUS | Status: AC
Start: 2016-11-07 — End: 2016-11-07
  Administered 2016-11-07: 500 mL via INTRAVENOUS

## 2016-11-07 MED ORDER — EPINEPHRINE PF 1 MG/10ML IJ SOSY
PREFILLED_SYRINGE | INTRAMUSCULAR | Status: AC | PRN
  Administered 2016-11-07: 1 mg via INTRAVENOUS

## 2016-11-07 MED ORDER — CHLORHEXIDINE GLUCONATE 0.12% ORAL RINSE (MEDLINE KIT)
15.0000 mL | Freq: Two times a day (BID) | OROMUCOSAL | Status: DC
  Filled 2016-11-07 (×2): qty 15

## 2016-11-07 MED ORDER — EPINEPHRINE PF 1 MG/ML IJ SOLN
0.5000 ug/min | INTRAVENOUS | Status: DC
  Filled 2016-11-07: qty 4

## 2016-11-07 MED ORDER — SODIUM CHLORIDE 0.9 % IV SOLN: 25.0000 ug/h | INTRAVENOUS | Status: DC

## 2016-11-07 MED ORDER — ORAL CARE MOUTH RINSE
15.0000 mL | Freq: Four times a day (QID) | OROMUCOSAL | Status: DC
  Filled 2016-11-07 (×3): qty 15

## 2016-11-07 MED ORDER — HYDROCORTISONE NA SUCCINATE PF 100 MG IJ SOLR
100.0000 mg | Freq: Once | INTRAMUSCULAR | Status: AC
  Administered 2016-11-07: 100 mg via INTRAVENOUS
  Filled 2016-11-07: qty 2

## 2016-11-07 MED ORDER — PANTOPRAZOLE SODIUM 40 MG IV SOLR: 40.0000 mg | Freq: Every day | INTRAVENOUS | Status: DC

## 2016-11-07 MED ORDER — SODIUM CHLORIDE 0.9 % IV SOLN
0.0000 ug/h | INTRAVENOUS | Status: DC
  Filled 2016-11-07: qty 50

## 2016-11-07 MED ORDER — CEFEPIME HCL 2 G IJ SOLR
2.0000 g | Freq: Once | INTRAMUSCULAR | Status: AC
  Administered 2016-11-07: 2 g via INTRAVENOUS
  Filled 2016-11-07: qty 2

## 2016-11-07 MED ORDER — DEXTROSE 50 % IV SOLN: 50.0000 mL | Freq: Once | INTRAVENOUS | Status: DC

## 2016-11-07 MED ORDER — VANCOMYCIN HCL IN DEXTROSE 1-5 GM/200ML-% IV SOLN: 1000.0000 mg | INTRAVENOUS | Status: DC

## 2016-11-07 MED ORDER — FENTANYL BOLUS VIA INFUSION: 50.0000 ug | INTRAVENOUS | Status: DC | PRN

## 2016-11-07 MED ORDER — ATROPINE SULFATE 1 MG/10ML IJ SOSY: 1.0000 mg | PREFILLED_SYRINGE | INTRAMUSCULAR | Status: DC | PRN

## 2016-11-08 MED FILL — Medication: Qty: 2 | Status: AC

## 2016-11-08 NOTE — ED Notes (Signed)
Pt. Family thanked staff and stated he would call back with funeral arrangements in the next couple days.

## 2016-11-09 LAB — URINE CULTURE

## 2016-11-10 ENCOUNTER — Ambulatory Visit: Payer: Self-pay | Admitting: *Deleted

## 2016-11-10 ENCOUNTER — Encounter: Payer: Self-pay | Admitting: *Deleted

## 2016-11-10 ENCOUNTER — Other Ambulatory Visit: Payer: Self-pay | Admitting: *Deleted

## 2016-11-10 LAB — COMPREHENSIVE METABOLIC PANEL
ALT: 264 U/L — ABNORMAL HIGH (ref 14–54)
ANION GAP: 25 — AB (ref 5–15)
AST: 348 U/L — ABNORMAL HIGH (ref 15–41)
Albumin: 2.1 g/dL — ABNORMAL LOW (ref 3.5–5.0)
Alkaline Phosphatase: 115 U/L (ref 38–126)
BUN: 9 mg/dL (ref 6–20)
CHLORIDE: 99 mmol/L — AB (ref 101–111)
CO2: 8 mmol/L — ABNORMAL LOW (ref 22–32)
CREATININE: 1.07 mg/dL — AB (ref 0.44–1.00)
Calcium: 8.5 mg/dL — ABNORMAL LOW (ref 8.9–10.3)
GFR calc Af Amer: >60 mL/min (ref >60)
GFR, EST NON AFRICAN AMERICAN: 53 mL/min — AB (ref >60)
Glucose, Bld: 52 mg/dL — ABNORMAL LOW (ref 65–99)
Potassium: 2.7 mmol/L — CL (ref 3.5–5.1)
Sodium: 132 mmol/L — ABNORMAL LOW (ref 135–145)
Total Bilirubin: 0.9 mg/dL (ref 0.3–1.2)
Total Protein: 5.1 g/dL — ABNORMAL LOW (ref 6.5–8.1)

## 2016-11-10 LAB — PATHOLOGIST SMEAR REVIEW

## 2016-11-10 NOTE — Patient Outreach (Signed)
RN CM to close pt's case, view in Epic pt  admitted 2016/11/22, received an in basket from hospital RN 11/08/16 that  Pt expired.    Plan:  Plan to send Wilhelmina Mcardle NP by in basket case closure letter.            Plan to inform [facility] CMA to close case.    Shayne Alken.   Pierzchala RN CCM [facility] Care Management  [phone]

## 2016-11-12 LAB — CULTURE, BLOOD (ROUTINE X 2)
CULTURE: NO GROWTH
CULTURE: NO GROWTH
SPECIAL REQUESTS: ADEQUATE
SPECIAL REQUESTS: ADEQUATE

## 2016-11-14 ENCOUNTER — Other Ambulatory Visit: Payer: Self-pay | Admitting: Nurse Practitioner

## 2016-11-14 LAB — CBC WITH DIFFERENTIAL/PLATELET
BASOS PCT: 0 %
Basophils Absolute: 0 10*3/uL (ref 0–0.1)
EOS PCT: 1 %
Eosinophils Absolute: 0 10*3/uL (ref 0–0.7)
HCT: 33.7 % — ABNORMAL LOW (ref 35.0–47.0)
HEMOGLOBIN: 11 g/dL — AB (ref 12.0–16.0)
LYMPHS ABS: 0.2 10*3/uL — AB (ref 1.0–3.6)
Lymphocytes Relative: 73 %
MCH: 29.8 pg (ref 26.0–34.0)
MCHC: 32.7 g/dL (ref 32.0–36.0)
MCV: 91.1 fL (ref 80.0–100.0)
MONOS PCT: 2 %
Monocytes Absolute: 0 10*3/uL — ABNORMAL LOW (ref 0.2–0.9)
NEUTROS PCT: 24 %
Neutro Abs: 0 10*3/uL — ABNORMAL LOW (ref 1.4–6.5)
Platelets: 320 10*3/uL (ref 150–440)
RBC: 3.7 MIL/uL — AB (ref 3.80–5.20)
RDW: 18.7 % — ABNORMAL HIGH (ref 11.5–14.5)
WBC: 0.2 10*3/uL — AB (ref 4.0–10.5)

## 2016-11-14 NOTE — Progress Notes (Signed)
Completed death certificate with information provided from ED encounter on 2016-11-14.

## 2016-11-17 LAB — BLOOD GAS, VENOUS
Acid-base deficit: 21.5 mmol/L — ABNORMAL HIGH (ref 0.0–2.0)
Bicarbonate: 6.7 mmol/L — ABNORMAL LOW (ref 20.0–28.0)
FIO2: 100
PATIENT TEMPERATURE: 37
PO2 VEN: UNDETERMINED mmHg (ref 32.0–45.0)
pCO2, Ven: 22 mmHg — ABNORMAL LOW (ref 44.0–60.0)
pH, Ven: 7.09 — CL (ref 7.250–7.430)

## 2016-11-20 ENCOUNTER — Ambulatory Visit: Payer: Self-pay | Admitting: Nurse Practitioner

## 2016-11-25 ENCOUNTER — Ambulatory Visit: Payer: Self-pay | Admitting: Nurse Practitioner
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
## 2016-12-04 NOTE — ED Provider Notes (Addendum)
[facility] Emergency Department Provider Note   ____________________________________________   First MD Initiated Contact with Patient 11/25/16 1709     (approximate)  I have reviewed the triage vital signs and the nursing notes.   HISTORY  Chief Complaint Respiratory Distress and Altered Mental Status   HPI Kelly Foster is a 66 y.o. female With a history of bipolar disorder, adrenal insufficiency and hypertension who is presenting with shortness of breath. She was found altered by her son. It is unclear when the altered mental status started. She was complaining of shortness of breath. Initial oxygen saturation was found to be in the 50s. Patient was placed on a nonrebreather mask by fire rescue but was continuously ripping off the mask. Patient was able to placed on nasal cannula with saturations in the 80s. Access unable to be obtained in route. Patient will tell me that she is not having any pain and that she is having shortness of breath but is unable to give further history.   Past Medical History:  Diagnosis Date  . Adrenal insufficiency (HCC)   . Adynamia 06/06/2013  . Allergy   . Anxiety   . Arthritis   . Below normal amount of sodium in the blood 06/06/2013  . Bipolar 1 disorder (HCC)   . Bipolar 1 disorder, mixed (HCC) 06/06/2013  . Candidiasis of mouth and esophagus (HCC) 08/13/2016  . Central spinal stenosis   . Cervical radiculopathy, chronic   . Chronic adrenal insufficiency (HCC) 06/06/2013  . Chronic low back pain without sciatica   . Chronic pain syndrome   . Cognitive change 09/16/2016   Pt w/ repetitive statements.  Poor ability for recent memory.  . Depression   . Disorder of left sacroiliac joint   . DJD (degenerative joint disease)   . Elevated CK   . Failure to thrive in adult 08/28/2016  . Hemorrhoids 07/11/2016  . Hip pain   . History of kidney stones   . Hyperlipidemia   . Hypertension   . Hyponatremia   . Hypothyroidism    . Intractable pain 12/20/2014  . Lower urinary tract infection 06/06/2013   Overview:  IMO Problem List Replacer Jan. 2016   . Lumbar radiculitis   . Manic depressive disorder (HCC)   . Neuritis or radiculitis due to rupture of lumbar intervertebral disc 05/29/2014  . OA (osteoarthritis) of hip   . Orthostatic hypotension 08/28/2016  . Overweight   . Pain management contract agreement 08/01/2015   Overview:  [facility]-TREATMENT AGREEMENT; Read, reviewed and signed. Copy given to patient. Original to Medical Records. PCG 09/02/2016  . Prediabetes   . Reflux esophagitis   . Sacrococcygeal disorders, not elsewhere classified   . Sacroiliac joint dysfunction 04/05/2015  . Slow transit constipation 09/12/2016  . 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 Trochanteric bursitis of left hip   . Ulnar neuropathy   . Urinary frequency   . Vaginal atrophy     Patient Active Problem List   Diagnosis Date Noted  . Allergic conjunctivitis of both eyes 10/23/2016  . Cognitive change 09/16/2016  . Slow transit constipation 09/12/2016  . Failure to thrive in adult 08/28/2016  . Orthostatic hypotension 08/28/2016  . Hyponatremia 08/28/2016  . Hyperglycemia 08/28/2016  . Protein malnutrition (HCC) 08/13/2016  . Hemorrhoids 07/11/2016  . Hyperlipidemia   . Depression   . Central spinal stenosis   . Cervical radiculopathy, chronic   . OA (osteoarthritis) of hip   .  Chronic pain syndrome   . Chronic low back pain without sciatica   . Hypothyroidism   . Reflux esophagitis   . Pain management contract agreement 08/01/2015  . Sacroiliac joint dysfunction 04/05/2015  . 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 Intractable pain 12/20/2014  . Lumbar radiculitis 05/29/2014  . Bipolar 1 disorder, mixed (HCC) 06/06/2013  . Chronic adrenal insufficiency (HCC) 06/06/2013  . Elevated CK 06/06/2013  . Adynamia 06/06/2013    Past Surgical History:  Procedure  Laterality Date  . ABDOMINAL HYSTERECTOMY    . arm surgery Left   . CESAREAN SECTION    . JOINT REPLACEMENT Left 2016   dec  . TOTAL HIP ARTHROPLASTY Left 12/26/2014   Procedure: TOTAL HIP ARTHROPLASTY ANTERIOR APPROACH;  Surgeon: Kennedy Bucker, MD;  Location: [facility] ORS;  Service: Orthopedics;  Laterality: Left;    Prior to Admission medications   Medication Sig Start Date End Date Taking? Authorizing Provider  acyclovir (ZOVIRAX) 400 MG tablet Take 1 tablet (400 mg total) by mouth daily. Patient not taking: Reported on 10/16/2016 10/15/16   Galen Manila, NP  amitriptyline (ELAVIL) 50 MG tablet TK 1 T PO QHS 10/01/16   [provider]  aspirin 81 MG tablet Take 1 tablet (81 mg total) by mouth daily. 07/23/16   Galen Manila, NP  azelastine (OPTIVAR) 0.05 % ophthalmic solution Place 1 drop into both eyes 2 (two) times daily. 10/15/16   Galen Manila, NP  BENZOCAINE, DENTAL, 20 % LIQD Use as directed in the mouth or throat. Daily as needed.    [provider]  Calcium Carbonate-Vitamin D3 (CALCIUM 600-D) 600-400 MG-UNIT TABS Take 1 tablet by mouth daily.    [provider]  divalproex (DEPAKOTE ER) 500 MG 24 hr tablet TK 2 TS PO HS 09/29/16   [provider]  HYDROcodone-acetaminophen (NORCO/VICODIN) 5-325 MG tablet Take 1 tablet by mouth 2 (two) times daily.    [provider]  hydrocortisone (CORTEF) 5 MG tablet Take 3 tabs (15mg ) in morning.  Take 1 tab (5 mg) at 5pm. 10/28/16   Galen Manila, NP  hydrocortisone (PROCTOSOL [facility]) 2.5 % rectal cream  06/26/15   [provider]  mometasone (NASONEX) 50 MCG/ACT nasal spray Place 2 sprays into the nose daily. Patient not taking: Reported on 10/16/2016 09/23/16   Galen Manila, NP  montelukast (SINGULAIR) 10 MG tablet Take 1 tablet (10 mg total) by mouth at bedtime. Reported on 04/05/2015 09/23/16   Galen Manila, NP  Multiple Vitamin (MULTIVITAMIN) tablet Take  1 tablet by mouth daily.    [provider]  omeprazole (PRILOSEC) 20 MG capsule Take 1 capsule (20 mg total) by mouth daily. 10/20/16   Galen Manila, NP  oxybutynin (DITROPAN XL) 15 MG 24 hr tablet Take 15 mg by mouth 2 (two) times daily after a meal.    [provider]  senna (SENOKOT) 8.6 MG tablet Take 1 tablet (8.6 mg total) by mouth daily. 09/11/16   Galen Manila, NP  sertraline (ZOLOFT) 100 MG tablet Take 2 tablets (200 mg total) by mouth at bedtime. 10/15/16   Galen Manila, NP  simvastatin (ZOCOR) 40 MG tablet Take 1 tablet (40 mg total) by mouth at bedtime. 10/10/16   Galen Manila, NP    Allergies Gabapentin; Codeine; Pregabalin; and Tramadol  Family History  Problem Relation Age of Onset  . Alzheimer's disease Mother   . Arthritis Mother   .  Stroke Father   . Hypertension Father   . Cancer Father   . Colon cancer Sister   . Hypertension Sister   . Kidney disease Neg Hx     Social History Social History  Substance Use Topics  . Smoking status: Former Smoker    Packs/day: 1.00    Types: Cigarettes    Quit date: 03/01/1995  . Smokeless tobacco: Never Used     Comment: quit 1998  . Alcohol use No    Review of Systems  level V caveat secondary to patient being with decreased mentation secondary to critical illness.  ____________________________________________   PHYSICAL EXAM:  VITAL SIGNS: ED Triage Vitals  Enc Vitals Group     BP 12/02/2016 1826 113/75     Pulse Rate 2016-12-02 1826 (!) 168     Resp 12/02/2016 1826 20     Temp --      Temp src --      SpO2 02-Dec-2016 1815 100 %     Weight 2016-12-02 1825 147 lb 14.9 oz (67.1 kg)     Height --      Head Circumference --      Peak Flow --      Pain Score --      Pain Loc --      Pain Edu? --      Excl. in GC? --     Constitutional: Alert and orientedto self only. EMS reports baseline is alert and oriented 3.  patient fighting staff. Very difficult to obtain IV  access. Attempted to place oxygen via nonrebreather patient continues to pull off the nonrebreather. Placed on nasal cannula with oxygen saturation of 94%.  Eyes: Conjunctivae are normal. PERRL. EOMI. Head: Atraumatic. Nose: No congestion/rhinnorhea. Mouth/Throat: Mucous membranes are dry. Neck: No stridor.   Cardiovascular: tachycardic, regular rhythm. Grossly normal heart sounds.   Respiratory: tachypneic with labored respirations with rales to the right lower field. Gastrointestinal: Soft and nontender. No distention. Musculoskeletal: No lower extremity tenderness nor edema.  No joint effusions. Neurologic:  Normal speech and language. moves all 4 extremities equally. Skin:  Skin is warm, dry and intact. No rash noted.   ____________________________________________   LABS (all labs ordered are listed, but only abnormal results are displayed)  Labs Reviewed  COMPREHENSIVE METABOLIC PANEL - Abnormal; Notable for the following:       Result Value   Sodium 132 (*)    Potassium 2.7 (*)    Chloride 99 (*)    CO2 8 (*)    Glucose, Bld 52 (*)    Creatinine, Ser 1.07 (*)    Calcium 8.5 (*)    Total Protein 5.1 (*)    Albumin 2.1 (*)    AST 348 (*)    ALT 264 (*)    GFR calc non Af Amer 53 (*)    Anion gap 25 (*)    All other components within normal limits  CBC WITH DIFFERENTIAL/PLATELET - Abnormal; Notable for the following:    WBC 0.2 (*)    RBC 3.70 (*)    Hemoglobin 11.0 (*)    HCT 33.7 (*)    RDW 18.7 (*)    Neutro Abs 0.0 (*)    Lymphs Abs 0.2 (*)    Monocytes Absolute 0.0 (*)    All other components within normal limits  LACTIC ACID, PLASMA - Abnormal; Notable for the following:    Lactic Acid, Venous 8.6 (*)    All other components within normal limits  BLOOD GAS, VENOUS - Abnormal; Notable for the following:    pH, Ven 7.09 (*)    pCO2, Ven 22 (*)    Bicarbonate 6.7 (*)    Acid-base deficit 21.5 (*)    All other components within normal limits  GLUCOSE,  CAPILLARY - Abnormal; Notable for the following:    Glucose-Capillary 41 (*)    All other components within normal limits  GLUCOSE, CAPILLARY - Abnormal; Notable for the following:    Glucose-Capillary 60 (*)    All other components within normal limits  GLUCOSE, CAPILLARY - Abnormal; Notable for the following:    Glucose-Capillary 103 (*)    All other components within normal limits  CULTURE, BLOOD (ROUTINE X 2)  CULTURE, BLOOD (ROUTINE X 2)  URINE CULTURE  URINALYSIS, ROUTINE W REFLEX MICROSCOPIC  LACTIC ACID, PLASMA  PATHOLOGIST SMEAR REVIEW  TROPONIN I  BLOOD GAS, ARTERIAL   ____________________________________________  EKG  ED ECG REPORT I, Arelia Longest, the attending physician, personally viewed and interpreted this ECG.   Date: 12/06/16  EKG Time: 1710  Rate: 125  Rhythm: atrial fibrillation, rate 125  Axis: normal  Intervals:none  ST&T Change: no ST segment elevation or depression. No abnormal T-wave inversion.  ____________________________________________  RADIOLOGY  Dg Chest Port 1 View  Result Date: 12-06-2016 CLINICAL DATA:  OG tube placement. EXAM: PORTABLE CHEST 1 VIEW COMPARISON:  One-view chest x-ray 09/06/2016 FINDINGS: The heart size is normal. Defibrillator pads are in place. The patient has been intubated. The endotracheal tube terminates 3.3 cm above the carina. A right IJ line terminates in the distal SVC. There is no pneumothorax. The side port of the OG tube is in the stomach. Diffuse interstitial and airspace disease is present bilaterally. There is no pneumothorax. No definite effusions present. The visualized soft tissues and bony thorax are unremarkable. IMPRESSION: 1. Diffuse interstitial and airspace disease concerning for edema or pneumonia. 2. Support apparatus in satisfactory position as described above. Electronically Signed   By: Marin Roberts M.D.   On: December 06, 2016 19:02     ____________________________________________   PROCEDURES  Procedure(s) performed:   INTUBATION Performed by: Arelia Longest  Required items: required blood products, implants, devices, and special equipment available Patient identity confirmed: provided demographic data and hospital-assigned identification number Time out: Immediately prior to procedure a "time out" was called to verify the correct patient, procedure, equipment, support staff and site/side marked as required.  Indications: airway protection.    Intubation method: Glidescope Laryngoscopy   Preoxygenation: BVM  Sedatives: Ativan Paralytic: roccuronium  Tube Size: 7.5 cuffed  Post-procedure assessment: chest rise and ETCO2 monitor Breath sounds: equal and absent over the epigastrium Tube secured with: ETT holder Chest x-ray interpreted by radiologist and me.  Chest x-ray findings: endotracheal tube in appropriate position  Patient tolerated the procedure well with no immediate complications.  CENTRAL LINE Performed by: Arelia Longest Consent: The procedure was performed in an emergent situation. Required items: required blood products, implants, devices, and special equipment available Patient identity confirmed: arm band and provided demographic data Time out: Immediately prior to procedure a "time out" was called to verify the correct patient, procedure, equipment, support staff and site/side marked as required. Indications: vascular access Anesthesia: local infiltration Local anesthetic: lidocaine 1% with epinephrine Anesthetic total: 3 ml Patient sedated: no Preparation: skin prepped with 2% chlorhexidine Skin prep agent dried: skin prep agent completely dried prior to procedure Sterile barriers: all five maximum sterile barriers used -  cap, mask, sterile gown, sterile gloves, and large sterile sheet Hand hygiene: hand hygiene performed prior to central venous catheter  insertion  Location details: right ij  Catheter type: triple lumen Catheter size: 8 Fr Pre-procedure: landmarks identified Ultrasound guidance: yes Successful placement: yes Post-procedure: line sutured and dressing applied Assessment: blood return through all parts, free fluid flow, placement verified by x-ray and no pneumothorax on x-ray Patient tolerance: Patient tolerated the procedure well with no immediate complications.    Procedures  Critical Care performed:   CRITICAL CARE Performed by: Arelia Longest   Total critical care time: 60 minutes  Critical care time was exclusive of separately billable procedures and treating other patients.  Critical care was necessary to treat or prevent imminent or life-threatening deterioration.  Critical care was time spent personally by me on the following activities: development of treatment plan with patient and/or surrogate as well as nursing, discussions with consultants, evaluation of patient's response to treatment, examination of patient, obtaining history from patient or surrogate, ordering and performing treatments and interventions, ordering and review of laboratory studies, ordering and review of radiographic studies, pulse oximetry and re-evaluation of patient's condition.   ____________________________________________   INITIAL IMPRESSION / ASSESSMENT AND PLAN / ED COURSE  DDX: Sepsis, adrenal insufficiency, shock, pulmonary embolus, CHF, pneumothorax, altered mental status, renal failure ----------------------------------------- 7:22 PM on 2016-11-11 -----------------------------------------  Patient was very agitated upon presentation. Also very difficult access. We attempted multiple peripheral IVs. Attempted to lay the patient back for an EJ but she can became very agitated and when he did sit her up because of her worsening shortness of breath. Because of the patient's drop in blood pressure as well as  agitation and likely need for airway protection via intubation a right-sided intraosseous line was started. This was inserted with a drill. The area was prepped with chlorhexidine prior to insertion. The eye line was placed in the medial right tibia with good return of blood. Patient tolerated the procedure well.  And assisting Dr. was then able to place a left-sided EJ as the patient was able tolerate better with lying back at this time. Patient was given 0.5 mg of Ativan prior to the eye oh as well as EJ placement because of her agitation. Patient also found to have an initial glucose in the 40s. Given IM glucagon. Dextrose 50 was then given through the EJ. Fluid via a pressure bag was started then on the EJ as well as Levophed through the right-sided IO with improvement in the patient's blood pressure but with what appeared to be an SVT on the cardiac monitor. The patient's pressure improves to above 100 to about 113 systolic. Patient was weaned off of Levophed. Patient was able to be intubated at this time and a right-sided IJ was placed. Patient was ordered sepsis protocol with broad-spectrum antibiotics. D10 infusion was started. Fentanyl drip was ordered for sedation. The patient's heart rate came down eventually to what appeared to be a sinus tachycardia in the 1 teens. However, her pressure dropped again to its lowest point at 60 systolic and Levophed needed to be restarted. Patient also on multiple fluid boluses. Given a total of 1 amp of bicarbonate. Patient with a history of adrenal insufficiency. Hydrocortisone ordered. Unfortunately at this time we do not have ICU beds available and it may be several hours before 1 potentially becomes available. I discussed the case with Dr. Ignacia Marvel of the intensivist service at the [facility] says that he  is unable to accept the patient to [facility] because of limited bed availability there. I now have a call out to the [facility] critical care team as well for  potential transfer.    ----------------------------------------- 7:34 PM on 2016/11/10 -----------------------------------------  Patient now with about 250 cc of bright red blood from the ET tube over about a 15 minute span. Discussed given with Dr. Ignacia Marvel regarding emergent bronchoscopy. Coagulation studies ordered. Hydrocortisone is been given. Type and screen sent. Intensivist says he is calling one of the on-call ICU physicians to do possible emergent bronchoscopy.  ----------------------------------------- 7:59 PM on 11/10/16 -----------------------------------------  Dr. Lonn Georgia at the bedside and agrees with the plan. However, recommends adding dopamine and starting at 20 mics per minute because of the patient's now bradycardia to the 50s. I also discussed case with the patient's son, Kelly Foster, and told him that the patient was critically ill and that he should come to the hospital as soon as possible. He is understanding of the gravity of the situation and the critical nature and possible deadly illness of his mother.signed out to the hospitalist, Dr. Cherlynn Kaiser. ____________________________________________   FINAL CLINICAL IMPRESSION(S) / ED DIAGNOSES  septic shock. Hypoxia. Multifocal pneumonia.adrenal crisis.    NEW MEDICATIONS STARTED DURING THIS VISIT:  New Prescriptions   No medications on file     Note:  This document was prepared using Dragon voice recognition software and may include unintentional dictation errors.    Schaevitz, Myra Rude, MD 11-10-16 2000  Patient at this time lost pulses. CPR was begun, NP Madalyn had initiated ACLS care. She has been at the patient's bedside in consultation with Dr. Lonn Georgia for several hours at this point. Patient given several rounds of epinephrine. CPR still in progress. I discussed the current state with the patient's son and he says that he is trying his heart as he can to come to the hospital but is unable to leave at  this time because he is watching children. I discussed with him the extremely critical condition and his mother's and at this time and that she may die because of her illness. He is understanding.  Patient also found to have an INR greater than 10. Blood products ordered. Large amount of blood now from the ET tube.    Myrna Blazer, MD 11-10-16 06/09/16  called into the patient's room for a CODE BLUE. Nurse practitioner from the ICU at the bedside. Dr. Lonn Georgia also came to the bedside. Multiple rounds of epinephrine given. Unable to achieve ROSC. Time of death called at 09-Jun-2120. Unfortunately I had to inform the son over the phone. He gave me permission to also discuss the events with his fiance. Likely cause of death is overwhelming sepsis with DIC. Likely secondary to pneumonia. Family still trying to arrange transportation to come to the hospital.    Myrna Blazer, MD 11/10/2016 Jun 09, 2128

## 2016-12-04 NOTE — ED Notes (Signed)
Per Dr. Pershing Proud and Intensivist, order Urine Drug Screen

## 2016-12-04 NOTE — ED Notes (Signed)
Pt arrived via EMS from home. Son found patient on the floor, and was c/o shortness of breath.  When pt arrived to ED, pt was thrashing around in the bed unable to tolerate the NRB.  Pt on [place] at this time 6 liters.  Pt is able to follow some commands. Lungs sounds-rales, pt is cool to the touch.

## 2016-12-04 NOTE — ED Notes (Signed)
All pockets checked, jewerly were put in sterile cup and placed in handbag along with patients medications.  Pt. Had large duffle bag all belonging put in bag.  Pt. Son who is emergency contact given duffle bag.

## 2016-12-04 NOTE — ED Notes (Signed)
Central line placed by Dr. Pershing Proud. At this point pt has several pressure bags of NS running at this time.

## 2016-12-04 NOTE — ED Notes (Signed)
All meds/fluids delayed r/t access problems. MD and primary RN ashley in room since pt arrival.

## 2016-12-04 NOTE — Progress Notes (Signed)
Pharmacy Antibiotic Note  Kelly Foster is a 66 y.o. female admitted on 14-Nov-2016 with HCAP.  Pharmacy has been consulted for cefepime and vancomycin dosing.  Plan: 1. Cefepime 2 gm IV x 1 in ED followed by cefepime 1 gm IV Q8H 2. Vancomycin 1 gm IV x 1 in ED followed in approximately 10 hours (stacked dosing) by vancomycin 1 gm IV Q18H, predicted trough 18 mcg/mL. Pharmacy will continue to follow and adjust as needed to maintain trough 15 to 20 mcg/ml.   Vd 42.8 L, Ke 0.046 hr-1, T1/2 14.9 hr  Weight: 147 lb 14.9 oz (67.1 kg)  No data recorded.   Recent Labs Lab 14-Nov-2016 1742  WBC 0.2*  CREATININE 1.07*  LATICACIDVEN 8.6*    Estimated Creatinine Clearance: 51 mL/min (A) (by C-G formula based on SCr of 1.07 mg/dL (H)).    Allergies  Allergen Reactions  . Gabapentin Other (See Comments)    tremors tremors  . Codeine Itching  . Pregabalin Other (See Comments)    Left side pain under arm, started after use of lyrica  . Tramadol Itching    Thank you for allowing pharmacy to be a part of this patient's care.  Carola Frost, Pharm.D., BCPS Clinical Pharmacist 11-14-16 7:25 PM

## 2016-12-04 NOTE — ED Notes (Signed)
Other family members in room with patient.

## 2016-12-04 NOTE — Consult Note (Signed)
PULMONARY / CRITICAL CARE MEDICINE   Name: Kelly Foster MRN: [id number] DOB: April 16, 1950    ADMISSION DATE:  11-26-16   CONSULTATION DATE:  26-Nov-2016  REFERRING MD:  Dr.Schaevitz  REASON: Acute respiratory failure  CHIEF COMPLAINT:  AMS  HISTORY OF PRESENT ILLNESS:  This is a 66 year old African-American female with a past medical history as indicated below who presented to the ED  via EMS after being found on the floor by her family. History is obtained from ED  and EMS records. Per ED nurse, patient's son called EMS when he found patient on the floor face down in acute respiratory distress.Patient was confused and restless when EMS arrived. She was hypoxic, hence EMS placed her on a nonrebreather mask. Upon arrival in the ED, patient was moaning and groaning and trying to take off the nonrebreather mask. She became more and more combative. She c/o shortness of breath and was awake enough to follow simple commands. However, her respiratory status continued to decline and she became severely hypoxic, hence, she was emergently intubated. Post intubation, patient became severely hypotensive and bradycardic. She was started on a dopamine infusion, transcutaneous pacing, given atropine, Levophed infusion and IV fluid resuscitation. Patient started bleeding profusely from the ETT. PCCM was called to emergently evaluate patient. Upon arrival, patient was in refractory shock with mean arterial blood pressure in the 40s. She was given pushes of sodium bicarbonate, more atropine and epinephrine infusion was also started. A type and screen alongside blood products was called Despite resuscitative efforts, patient continued to decline clinically and went into cardiac arrest. she was coded unsuccessfully and patient was pronounced at  in the emergency room. Of note,on 11/04/2016, patient had called her home health care agency and indicated that she was vomiting x 2 days.she was advised to go to the hospital,  but she never did.  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); Allergy; Anxiety; Arthritis; Below normal amount of sodium in the blood (06/06/2013); Bipolar 1 disorder (HCC); Bipolar 1 disorder, mixed (HCC) (06/06/2013); Candidiasis of mouth and esophagus (HCC) (08/13/2016); Central spinal stenosis; Cervical radiculopathy, chronic; Chronic adrenal insufficiency (HCC) (06/06/2013); Chronic low back pain without sciatica; Chronic pain syndrome; Cognitive change (09/16/2016); Depression; Disorder of left sacroiliac joint; DJD (degenerative joint disease); Elevated CK; Failure to thrive in adult (08/28/2016); Hemorrhoids (07/11/2016); Hip pain; History of kidney stones; Hyperlipidemia; Hypertension; Hyponatremia; Hypothyroidism; Intractable pain (12/20/2014); Lower urinary tract infection (06/06/2013); Lumbar radiculitis; Manic depressive disorder (HCC); Neuritis or radiculitis due to rupture of lumbar intervertebral disc (05/29/2014); OA (osteoarthritis) of hip; Orthostatic hypotension (08/28/2016); Overweight; Pain management contract agreement (08/01/2015); Prediabetes; Reflux esophagitis; Sacrococcygeal disorders, not elsewhere classified; Sacroiliac joint dysfunction (04/05/2015); Slow transit constipation (09/12/2016); 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  PAST SURGICAL HISTORY: She  has a past surgical history that includes arm surgery (Left); Abdominal hysterectomy; Cesarean section; Total hip arthroplasty (Left, 12/26/2014); and Joint replacement (Left, 2016   dec).  Allergies  Allergen Reactions  . Gabapentin Other (See Comments)    tremors tremors  . Codeine Itching  . Pregabalin Other (See Comments)    Left side pain under arm, started after use of lyrica  . Tramadol Itching    No current facility-administered medications on file prior  to encounter.    Current Outpatient Prescriptions on File Prior to Encounter  Medication Sig  . acyclovir (ZOVIRAX)  400 MG tablet Take 1 tablet (400 mg total) by mouth daily.  Marland Kitchen aspirin 81 MG tablet Take 1 tablet (81 mg total) by mouth daily.  . divalproex (DEPAKOTE ER) 500 MG 24 hr tablet TK 2 TS PO HS  . hydrocortisone (CORTEF) 5 MG tablet Take 3 tabs ( ) in morning.  Take 1 tab (5 mg) at 5pm.  . montelukast (SINGULAIR) 10 MG tablet Take 1 tablet (10 mg total) by mouth at bedtime. Reported on 04/05/2015  . omeprazole (PRILOSEC) 20 MG capsule Take 1 capsule (20 mg total) by mouth daily.  Marland Kitchen oxybutynin (DITROPAN XL) 15 MG 24 hr tablet Take 15 mg by mouth 2 (two) times daily after a meal.  . sertraline (ZOLOFT) 100 MG tablet Take 2 tablets (200 mg total) by mouth at bedtime.  . simvastatin (ZOCOR) 40 MG tablet Take 1 tablet (40 mg total) by mouth at bedtime.  Marland Kitchen amitriptyline (ELAVIL) 50 MG tablet TK 1 T PO QHS  . azelastine (OPTIVAR) 0.05 % ophthalmic solution Place 1 drop into both eyes 2 (two) times daily.  . Calcium Carbonate-Vitamin D3 (CALCIUM 600-D) 600-400 MG-UNIT TABS Take 1 tablet by mouth daily.  Marland Kitchen HYDROcodone-acetaminophen (NORCO/VICODIN) 5-325 MG tablet Take 1 tablet by mouth 2 (two) times daily.  . hydrocortisone (PROCTOSOL HC) 2.5 % rectal cream   . mometasone (NASONEX) 50 MCG/ACT nasal spray Place 2 sprays into the nose daily. (Patient not taking: Reported on 10/16/2016)  . Multiple Vitamin (MULTIVITAMIN) tablet Take 1 tablet by mouth daily.  Marland Kitchen senna (SENOKOT) 8.6 MG tablet Take 1 tablet (8.6 mg total) by mouth daily.    FAMILY HISTORY:  Her indicated that her mother is deceased. She indicated that her father is deceased. She indicated that the status of her sister is unknown. She indicated that the status of her neg hx is unknown.    SOCIAL HISTORY: She  reports that she quit smoking about 21 years ago. Her smoking use included Cigarettes. She smoked 1.00 pack per day. She  has never used smokeless tobacco. She reports that she does not drink alcohol or use drugs.  REVIEW OF SYSTEMS:   Unable to obtain as patient is sedated and intubated SUBJECTIVE:   VITAL SIGNS: BP (!) 86/34   Pulse (!) 168   Resp 17   Wt 147 lb 14.9 oz (67.1 kg)   SpO2 100%   BMI 23.17 kg/m   HEMODYNAMICS:    VENTILATOR SETTINGS: Vent Mode: AC FiO2 (%):  [100 %] 100 % Set Rate:  [14 bmp] 14 bmp Vt Set:  [500 mL] 500 mL PEEP:  [5 cmH20] 5 cmH20  INTAKE / OUTPUT: No intake/output data recorded.  PHYSICAL EXAMINATION: General:  Acutely ill-looking Neuro: unresponsive to voice, touch and noxious stimulus, no corneal reflexes. Pupils dilated, fixed and unreactive HEENT: no visible head injuries, trachea midline, oral cavity and endotracheal tube with copious bloody secretions Cardiovascular:  Bradycardic, S1, S2, no murmur, regurg or gallop, faint pulses bilaterally, trace edema Lungs:  Bilateral breath sounds, diminished in the bases, positive Rales in bilateral lower lobes, no wheezing Abdomen:  Non-distended, normal bowel sounds, palpation reveals no organomegaly Musculoskeletal: +rom, no deformities Skin:  Warm and dry  LABS:  BMET  Recent Labs Lab 12/07/2016 1742  NA 132*  K 2.7*  CL 99*  CO2 8*  BUN 9  CREATININE 1.07*  GLUCOSE 52*    Electrolytes  Recent Labs Lab 12/07/16 1742  CALCIUM 8.5*    CBC  Recent Labs Lab  2016-12-06 1742  WBC 0.2*  HGB 11.0*  HCT 33.7*  PLT 320    Coag's No results for input(s): APTT, INR in the last 168 hours.  Sepsis Markers  Recent Labs Lab 12/06/2016 1742  LATICACIDVEN 8.6*    ABG No results for input(s): PHART, PCO2ART, PO2ART in the last 168 hours.  Liver Enzymes  Recent Labs Lab 12-06-16 1742  AST 348*  ALT 264*  ALKPHOS 115  BILITOT 0.9  ALBUMIN 2.1*    Cardiac Enzymes No results for input(s): TROPONINI, PROBNP in the last 168 hours.  Glucose  Recent Labs Lab Dec 06, 2016 1751  06-Dec-2016 1815 2016/12/06 1832  GLUCAP 41* 60* 103*    Imaging Dg Chest Port 1 View  Result Date: Dec 06, 2016 CLINICAL DATA:  OG tube placement. EXAM: PORTABLE CHEST 1 VIEW COMPARISON:  One-view chest x-ray 09/06/2016 FINDINGS: The heart size is normal. Defibrillator pads are in place. The patient has been intubated. The endotracheal tube terminates 3.3 cm above the carina. A right IJ line terminates in the distal SVC. There is no pneumothorax. The side port of the OG tube is in the stomach. Diffuse interstitial and airspace disease is present bilaterally. There is no pneumothorax. No definite effusions present. The visualized soft tissues and bony thorax are unremarkable. IMPRESSION: 1. Diffuse interstitial and airspace disease concerning for edema or pneumonia. 2. Support apparatus in satisfactory position as described above. Electronically Signed   By: Marin Roberts M.D.   On: 2016/12/06 19:02    STUDIES:  none  CULTURES: Blood cultures Urine culture  ANTIBIOTICS: Cefepime Vancomycin  SIGNIFICANT EVENTS: 10/05>ED with severe septic shock, DIC and respiratory failure  LINES/TUBES: Rt IJ PIVs ETT OG  DISCUSSION: 66 y/o AA female presenting with acute hypoxic respiratory failure, severe sepsis, septic and cardiogenic shock and severe coagulopathy secondary to DIC and sepsis. Review of patient's chart indicates that symptoms started on 11/03/2016 with nausea and vomiting which then progressed to anorexia, nausea and vomiting with brown emesis. Her symptoms progressively got worse until she was found face down, barely responsive, and in severe shock by her son. Patient was probably already in multiorgan failure prior to ED presentation. Patient probably developed sepsis from aspiration during episodes of nausea and vomiting and probably had a GI infection;developed respiratory failure and subsequently disseminated intravascular coagulation with severe coagulopathy and bleeding. She  might have sustained a massive ICH after she fell since her INR was >10   ASSESSMENT  Acute hypoxic respiratory  DIC Septic and cardiogenic shock. Severe bleeding Cardiac arrest. Possible GI infection.  PLAN Patient resuscitated extensively with multiple pressors, IV fluids, antibiotics, and transcutaneous pacing. Resuscitative effort was terminated and patient was pronounced at 2125/07/03. Plan or care established with Dr.:Conforti at bedside.  FAMILY  - Updates: family notified by ED attending of patient's death  -Mallory Schaad [facility] ANP-BC Pulmonary and Critical Care Medicine [facility] Pager [phone] or [phone] 12/06/2016, 8:05 PM

## 2016-12-04 NOTE — ED Notes (Signed)
ALL CRITICAL LAB RESULTS GIVEN TO ME WERE VERBALLY GIVEN TO DR Pershing Proud AT THE TIME OF THE PHONE CALL AS WE WERE IN THE PATIENTS ROOM TOGETHER.

## 2016-12-04 NOTE — Patient Outreach (Signed)
Error: This is not one of my patients.  This encounter was placed in error.  Rowe Pavy, RN, BSN, CEN [facility] [phone]

## 2016-12-04 NOTE — ED Notes (Signed)
Large amount of blood noted in ET tube, suction completed, about 200cc bright red blood suctioned out.  Informed Dr. Pershing Proud and RT.

## 2016-12-04 NOTE — Telephone Encounter (Signed)
[facility] is aware that I am concerned for pt safety and ability to care for herself independently in home.  Have requested evaluation for personal care services or assisted living facility placement.

## 2016-12-04 NOTE — ED Notes (Signed)
Dr. Pershing Proud aware unable to obtain blood cultures before antbiotics. Ok to start antibiotics and get blood cultures as soon as possible.  Pt has been critically ill with difficult access.  Dr. Pershing Proud to place central line.

## 2016-12-04 NOTE — Progress Notes (Signed)
RN Morrie Sheldon made aware pharmacy has been consulted to dose vancomycin and cefepime but we need patient's weight. She says she will update it when she can.

## 2016-12-04 NOTE — Progress Notes (Signed)
Peep to 0 for blood pressure

## 2016-12-04 NOTE — ED Provider Notes (Signed)
Angiocath insertion Performed by: Merrily Brittle  Consent: Verbal consent obtained. Risks and benefits: risks, benefits and alternatives were discussed Time out: Immediately prior to procedure a "time out" was called to verify the correct patient, procedure, equipment, support staff and site/side marked as required.  Preparation: Patient was prepped and draped in the usual sterile fashion.  Vein Location: left external jugular vein  Gauge: 20  Normal blood return and flush without difficulty Patient tolerance: Patient tolerated the procedure well with no immediate complications.      Merrily Brittle, MD Nov 08, 2016 [phone]

## 2016-12-04 NOTE — ED Notes (Signed)
Multiple attempts made, by multiple RNs for IV access.  Able to obtain 22 G in left lateral AC, however, infiltrated.  Able to obtain second 22G in R [facility] which also infiltrated.. Able to get 24 G in right hand.  Pt had IO placed at 1750 in the right leg and left EJ placed at this time 20 G.

## 2016-12-04 NOTE — H&P (Signed)
[employer] - [place] [facility]   PATIENT NAME: Kelly Foster    MR#:  [id number]  DATE OF BIRTH:  1950-03-13  DATE OF ADMISSION:  12-02-16  PRIMARY CARE PHYSICIAN: Galen Manila, NP   REQUESTING/REFERRING PHYSICIAN: Dr. Gladstone Pih  CHIEF COMPLAINT:   Chief Complaint  Patient presents with  . Respiratory Distress  . Altered Mental Status    HISTORY OF PRESENT ILLNESS:  Kelly Foster  is a 66 y.o. female with a known history of Adrenal insufficiency, bipolar disorder, chronic pain syndrome, chronic hypotension, depression, spinal stenosis who presents to the hospital due to altered mental status and being found unresponsive. Patient presently is intubated and critically ill therefore most of the history obtained from the ER physician. The year physician spoke to the patient's son who found his mom lethargic and unresponsive at home earlier today. She was last seen normal by the day or so ago. Patient was brought to the ER and was noted to be critically ill and severely hypotensive with systolic blood pressures in the 50s to 60s. She does have a previous history of adrenal insufficiency. Patient received 3 L IV fluids, hydrocortisone, started on IV vasopressors and continues to be hypotensive and critically ill. She has an acute metabolic acidosis secondary to elevated lactic acid and is unresponsive presently. She is being admitted to the intensive care unit and hospitalist services were contacted further treatment evaluation.  PAST MEDICAL HISTORY:   Past Medical History:  Diagnosis Date  . Adrenal insufficiency (HCC)   . Adynamia 06/06/2013  . Allergy   . Anxiety   . Arthritis   . Below normal amount of sodium in the blood 06/06/2013  . Bipolar 1 disorder (HCC)   . Bipolar 1 disorder, mixed (HCC) 06/06/2013  . Candidiasis of mouth and esophagus (HCC) 08/13/2016  . Central spinal stenosis   . Cervical radiculopathy, chronic   . Chronic adrenal  insufficiency (HCC) 06/06/2013  . Chronic low back pain without sciatica   . Chronic pain syndrome   . Cognitive change 09/16/2016   Pt w/ repetitive statements.  Poor ability for recent memory.  . Depression   . Disorder of left sacroiliac joint   . DJD (degenerative joint disease)   . Elevated CK   . Failure to thrive in adult 08/28/2016  . Hemorrhoids 07/11/2016  . Hip pain   . History of kidney stones   . Hyperlipidemia   . Hypertension   . Hyponatremia   . Hypothyroidism   . Intractable pain 12/20/2014  . Lower urinary tract infection 06/06/2013   Overview:  IMO Problem List Replacer Jan. 2016   . Lumbar radiculitis   . Manic depressive disorder (HCC)   . Neuritis or radiculitis due to rupture of lumbar intervertebral disc 05/29/2014  . OA (osteoarthritis) of hip   . Orthostatic hypotension 08/28/2016  . Overweight   . Pain management contract agreement 08/01/2015   Overview:  [facility]MENT AGREEMENT; Read, reviewed and signed. Copy given to patient. Original to Medical Records. PCG 09/02/2016  . Prediabetes   . Reflux esophagitis   . Sacrococcygeal disorders, not elsewhere classified   . Sacroiliac joint dysfunction 04/05/2015  . Slow transit constipation 09/12/2016  . 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 Trochanteric bursitis of left hip   . Ulnar neuropathy   . Urinary frequency   . Vaginal atrophy     PAST SURGICAL HISTORY:   Past Surgical History:  Procedure  Laterality Date  . ABDOMINAL HYSTERECTOMY    . arm surgery Left   . CESAREAN SECTION    . JOINT REPLACEMENT Left 2016   dec  . TOTAL HIP ARTHROPLASTY Left 12/26/2014   Procedure: TOTAL HIP ARTHROPLASTY ANTERIOR APPROACH;  Surgeon: Kennedy Bucker, MD;  Location: [facility] ORS;  Service: Orthopedics;  Laterality: Left;    SOCIAL HISTORY:   Social History  Substance Use Topics  . Smoking status: Former Smoker    Packs/day: 1.00    Types: Cigarettes    Quit date: 03/01/1995  .  Smokeless tobacco: Never Used     Comment: quit 1998  . Alcohol use No    FAMILY HISTORY:   Family History  Problem Relation Age of Onset  . Alzheimer's disease Mother   . Arthritis Mother   . Stroke Father   . Hypertension Father   . Cancer Father   . Colon cancer Sister   . Hypertension Sister   . Kidney disease Neg Hx     DRUG ALLERGIES:   Allergies  Allergen Reactions  . Gabapentin Other (See Comments)    tremors tremors  . Codeine Itching  . Pregabalin Other (See Comments)    Left side pain under arm, started after use of lyrica  . Tramadol Itching    REVIEW OF SYSTEMS:   Review of Systems  Unable to perform ROS: Intubated    MEDICATIONS AT HOME:   Prior to Admission medications   Medication Sig Start Date End Date Taking? Authorizing Provider  acyclovir (ZOVIRAX) 400 MG tablet Take 1 tablet (400 mg total) by mouth daily. 10/15/16  Yes Galen Manila, NP  aspirin 81 MG tablet Take 1 tablet (81 mg total) by mouth daily. 07/23/16  Yes Galen Manila, NP  divalproex (DEPAKOTE ER) 500 MG 24 hr tablet TK 2 TS PO HS 09/29/16  Yes [provider]  hydrocortisone (CORTEF) 5 MG tablet Take 3 tabs ( ) in morning.  Take 1 tab (5 mg) at 5pm. 10/28/16  Yes Galen Manila, NP  montelukast (SINGULAIR) 10 MG tablet Take 1 tablet (10 mg total) by mouth at bedtime. Reported on 04/05/2015 09/23/16  Yes Galen Manila, NP  omeprazole (PRILOSEC) 20 MG capsule Take 1 capsule (20 mg total) by mouth daily. 10/20/16  Yes Galen Manila, NP  oxybutynin (DITROPAN XL) 15 MG 24 hr tablet Take 15 mg by mouth 2 (two) times daily after a meal.   Yes [provider]  sertraline (ZOLOFT) 100 MG tablet Take 2 tablets (200 mg total) by mouth at bedtime. 10/15/16  Yes Galen Manila, NP  simvastatin (ZOCOR) 40 MG tablet Take 1 tablet (40 mg total) by mouth at bedtime. 10/10/16  Yes Galen Manila, NP  amitriptyline (ELAVIL) 50 MG tablet TK  1 T PO QHS 10/01/16   [provider]  azelastine (OPTIVAR) 0.05 % ophthalmic solution Place 1 drop into both eyes 2 (two) times daily. 10/15/16   Galen Manila, NP  Calcium Carbonate-Vitamin D3 (CALCIUM 600-D) 600-400 MG-UNIT TABS Take 1 tablet by mouth daily.    [provider]  HYDROcodone-acetaminophen (NORCO/VICODIN) 5-325 MG tablet Take 1 tablet by mouth 2 (two) times daily.    [provider]  hydrocortisone (PROCTOSOL HC) 2.5 % rectal cream  06/26/15   [provider]  mometasone (NASONEX) 50 MCG/ACT nasal spray Place 2 sprays into the nose daily. Patient not taking: Reported on 10/16/2016 09/23/16   Galen Manila, NP  Multiple  Vitamin (MULTIVITAMIN) tablet Take 1 tablet by mouth daily.    [provider]  senna (SENOKOT) 8.6 MG tablet Take 1 tablet (8.6 mg total) by mouth daily. 09/11/16   Galen Manila, NP      VITAL SIGNS:  Blood pressure (!) 86/34, pulse (!) 168, resp. rate 17, weight 67.1 kg (147 lb 14.9 oz), SpO2 100 %.  PHYSICAL EXAMINATION:  Physical Exam  GENERAL:  66 y.o.-year-old patient lying in the bed Critically ill sedated and intubated  EYES: Pupils are fixed and dilated with minimal response on the left pupil to light HEENT: Head atraumatic, normocephalic. ET and OG tubes in place with bloody secretions from ET tube.  NECK:  Supple, no jugular venous distention. No thyroid enlargement, no tenderness.  LUNGS: Normal breath sounds bilaterally, no wheezing, rales, rhonchi. No use of accessory muscles of respiration.  CARDIOVASCULAR: S1, S2 RRR. No murmurs, rubs, gallops, clicks.  ABDOMEN: Soft, nontender, nondistended. Bowel sounds present. No organomegaly or mass.  EXTREMITIES: No pedal edema, cyanosis, or clubbing. + 2 pedal & radial pulses b/l.   NEUROLOGIC: Intubated & Encephalopathic PSYCHIATRIC: INtubated and Encephalopathic.  SKIN: No obvious rash, lesion, or ulcer.   LABORATORY PANEL:    CBC  Recent Labs Lab 11-17-16 1742  WBC 0.2*  HGB 11.0*  HCT 33.7*  PLT 320   ------------------------------------------------------------------------------------------------------------------  Chemistries   Recent Labs Lab 17-Nov-2016 1742  NA 132*  K 2.7*  CL 99*  CO2 8*  GLUCOSE 52*  BUN 9  CREATININE 1.07*  CALCIUM 8.5*  AST 348*  ALT 264*  ALKPHOS 115  BILITOT 0.9   ------------------------------------------------------------------------------------------------------------------  Cardiac Enzymes No results for input(s): TROPONINI in the last 168 hours. ------------------------------------------------------------------------------------------------------------------  RADIOLOGY:  Dg Chest Port 1 View  Result Date: November 17, 2016 CLINICAL DATA:  OG tube placement. EXAM: PORTABLE CHEST 1 VIEW COMPARISON:  One-view chest x-ray 09/06/2016 FINDINGS: The heart size is normal. Defibrillator pads are in place. The patient has been intubated. The endotracheal tube terminates 3.3 cm above the carina. A right IJ line terminates in the distal SVC. There is no pneumothorax. The side port of the OG tube is in the stomach. Diffuse interstitial and airspace disease is present bilaterally. There is no pneumothorax. No definite effusions present. The visualized soft tissues and bony thorax are unremarkable. IMPRESSION: 1. Diffuse interstitial and airspace disease concerning for edema or pneumonia. 2. Support apparatus in satisfactory position as described above. Electronically Signed   By: Marin Roberts M.D.   On: November 17, 2016 19:02     IMPRESSION AND PLAN:   66 year old female with past medical history of bipolar disorder, adrenal insufficiency, hypertension, hypothyroidism, chronic pain, anxiety/depression, who was brought to the hospital due to altered mental status and noted to be in severe shock.  1. Altered mental status likely metabolic encephalopathy secondary to severe  shock. -Awaiting CT head although patient has not been stable to get this done yet. We'll follow mental status once hemodynamics and acidosis improves.  2. Shock-etiology unclear suspected to be a combination of septic/hypovolemic/related to adrenal insufficiency 2. -Continue aggressive IV fluid hydration, IV vasopressors. We'll empirically start the patient on stress dose Solu-Cortef. Follow hemodynamics. Follow cultures.  3. Leukopenia-etiology unclear, possible underlying sepsis, continue broad-spectrum IV antibiotics, follow white cell count. If not improving consider hematology oncology consult.  4. Lactic acidosis-secondary to severe hypotension -Continue supportive care with IV fluids vasopressors, follow hemodynamics follow lactic acid level.  5. Hypokalemia-we'll supplement using ICU replacement protocol. Follow potassium level, check  magnesium level.  6. Abnormal LFTs-secondary to shock liver from severe hypotension. Follow LFTs.  Patient is critically ill with multiorgan failure and has a poor prognosis. If not improving would consider palliative care consult to discuss goals of care.  All the records are reviewed and case discussed with ED provider. Management plans discussed with the patient, family and they are in agreement.  CODE STATUS: Full  TOTAL Critical Care TIME TAKING CARE OF THIS PATIENT: 45 minutes.    Houston Siren M.D on 11/13/16 at 8:15 PM  Between 7am to 6pm - Pager - [phone]  After 6pm go to [email or web address] - password [email or web address] [facility]  [place] [facility]  Office  [phone]  CC: Primary care physician; Galen Manila, NP

## 2016-12-04 NOTE — ED Triage Notes (Signed)
Pt arrived ems AMS. Pt moaning, will not leave NRB on. Found at home by son on the floor c/o SHOB.  Labored breathing. Hypoxia.

## 2016-12-04 NOTE — Code Documentation (Signed)
CRITICAL LAB: TROPONIN is 0.03, Darlyne Russian, NP MAGGIE TUKOV notified, orders received

## 2016-12-04 NOTE — ED Notes (Addendum)
Pt has approximately 700 cc bright red blood in suction canister from ET tube.

## 2016-12-04 DEATH — deceased

## 2018-12-08 IMAGING — CT CT ABD-PELV W/ CM
3 of 6 series · 15 of 46 positions shown, 17 images · IV contrast (APPLIED)
Comparison: 06/25/2015.

CLINICAL DATA: Abdominal distension with right-sided pain.
Vomiting.

EXAM:
CT ABDOMEN AND PELVIS WITH CONTRAST
TECHNIQUE: Multidetector CT imaging of the abdomen and pelvis was performed
using the standard protocol following bolus administration of
intravenous contrast.
CONTRAST:  100mL JKAJY6-FGG IOPAMIDOL (JKAJY6-FGG) INJECTION 61%

[Series 2: routine abd/pel with · axial · 0.72mm/px · z∈[-829,-469]mm · 10 of 88 slices shown, 12 images]
[im 8/88  soft-tissue]
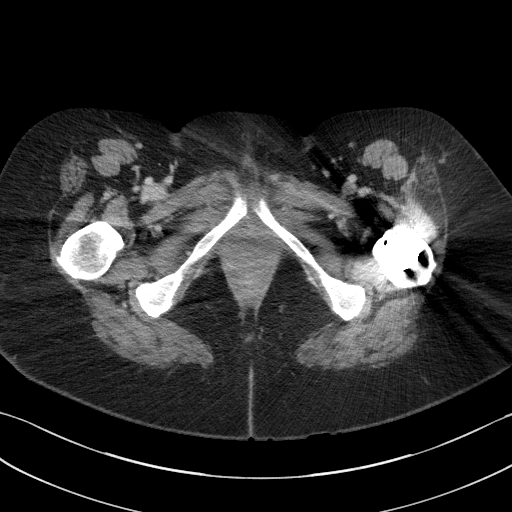
[im 8/88  bone]
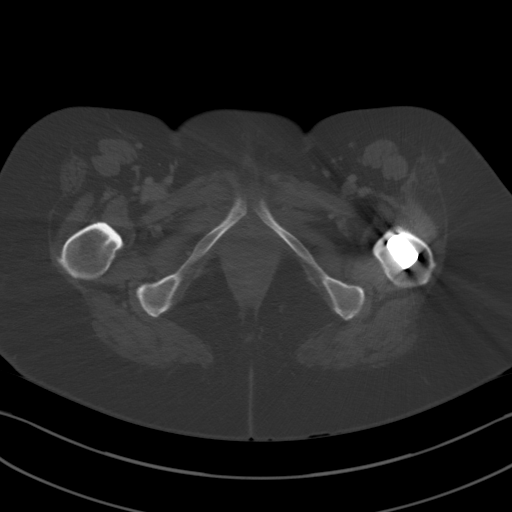
[im 16/88  soft-tissue]
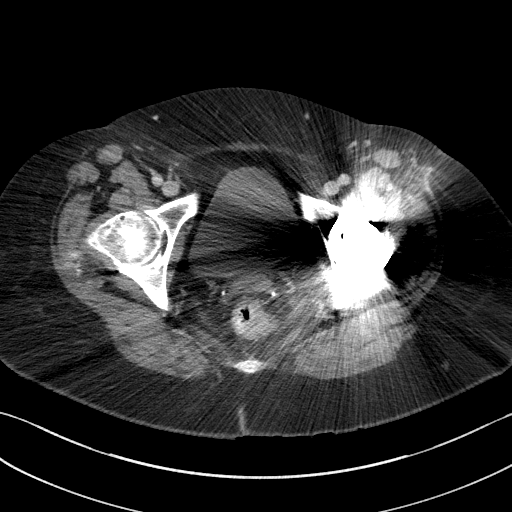
[im 24/88  soft-tissue]
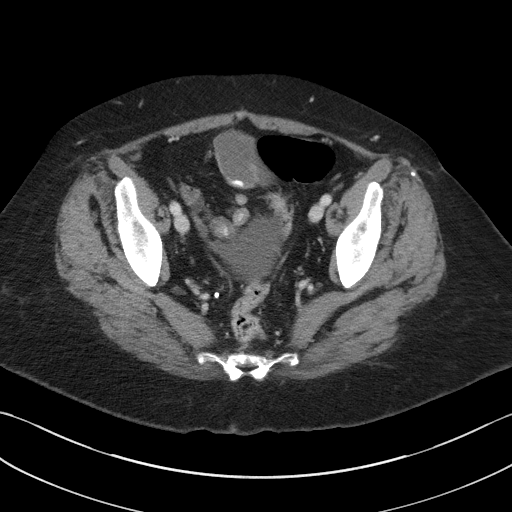
[im 32/88  soft-tissue]
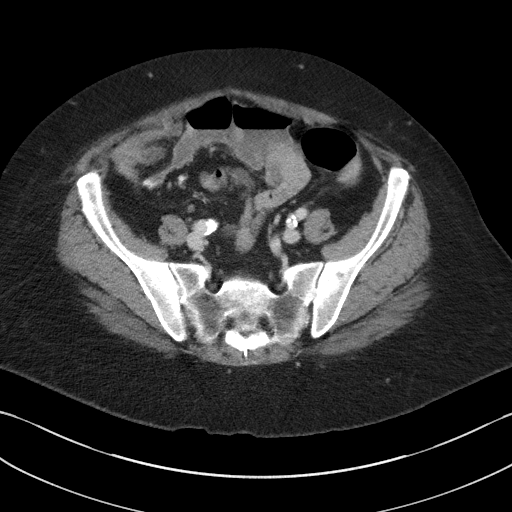
[im 40/88  soft-tissue]
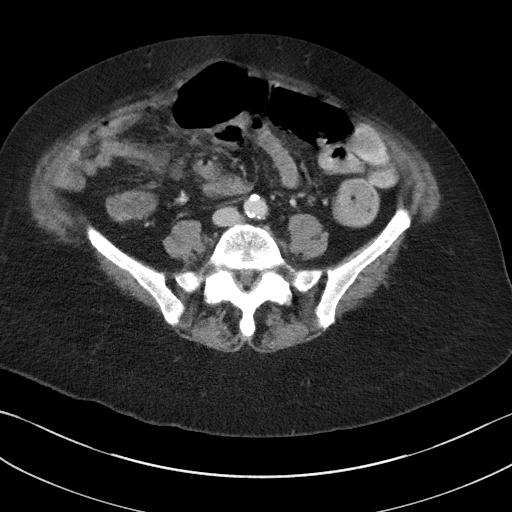
[im 48/88  soft-tissue]
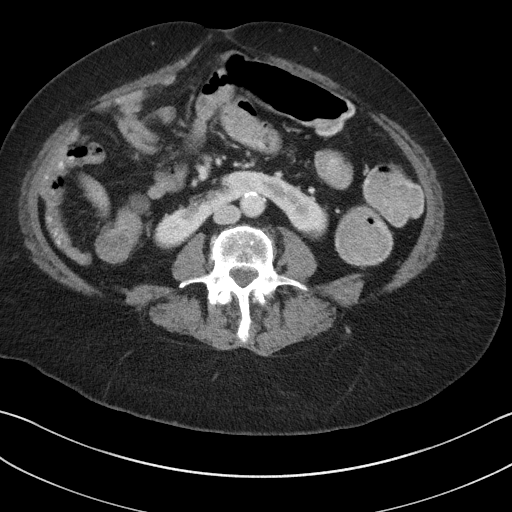
[im 56/88  soft-tissue]
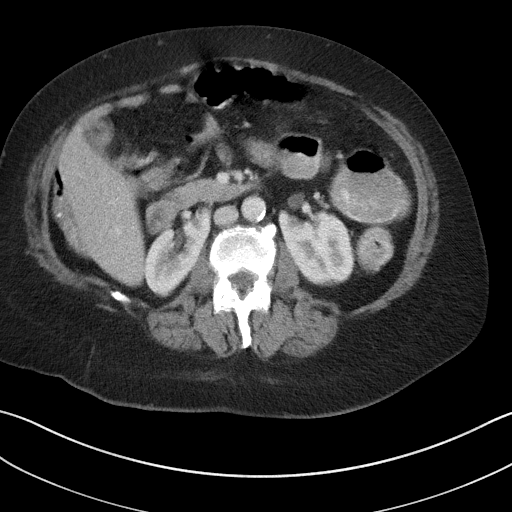
[im 64/88  soft-tissue]
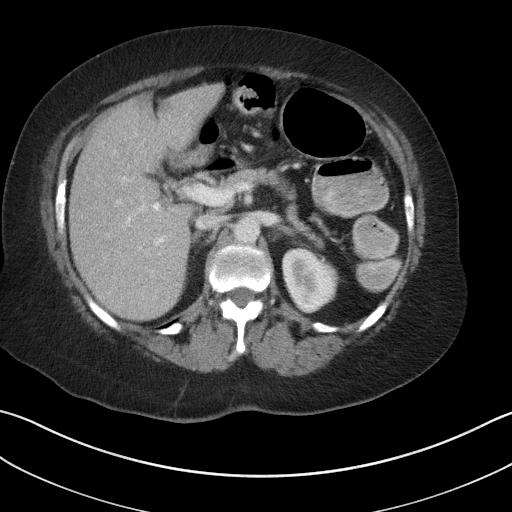
[im 72/88  soft-tissue]
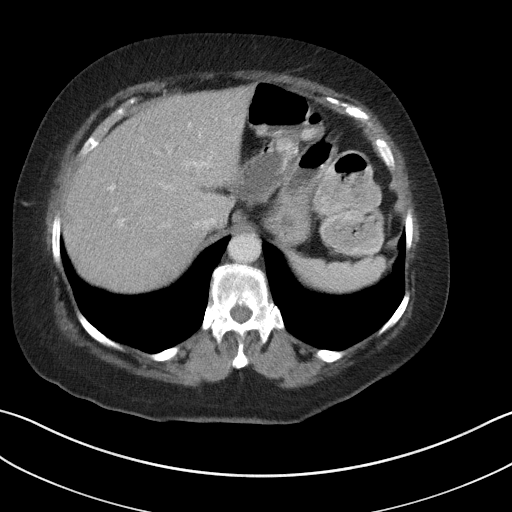
[im 72/88  bone]
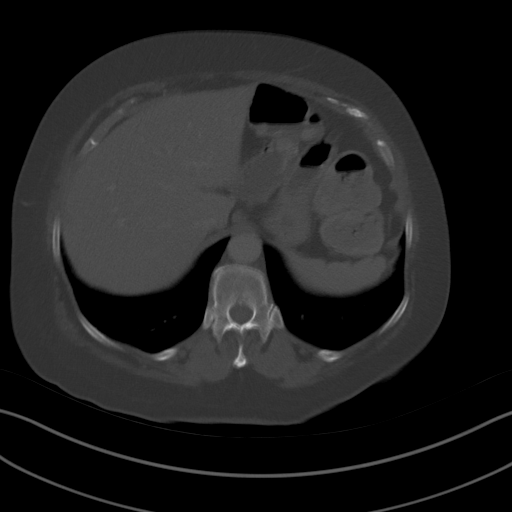
[im 80/88  soft-tissue]
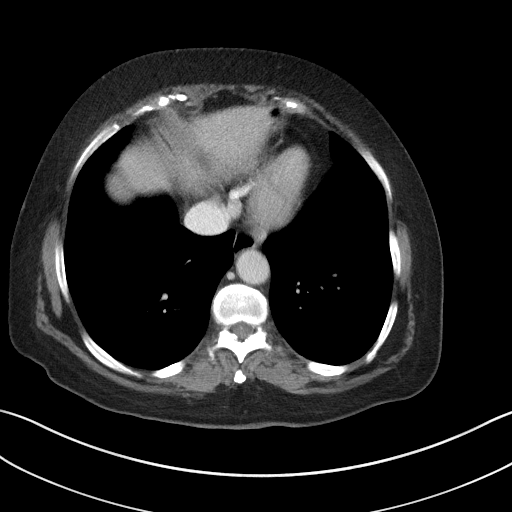

[Series 4: lung · axial · 0.72mm/px · z∈[-514,-474]mm · 2 of 26 slices shown]
[im 9/26  bone]
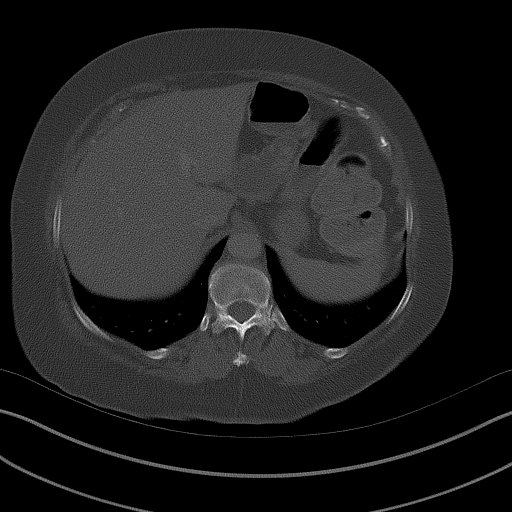
[im 17/26  bone]
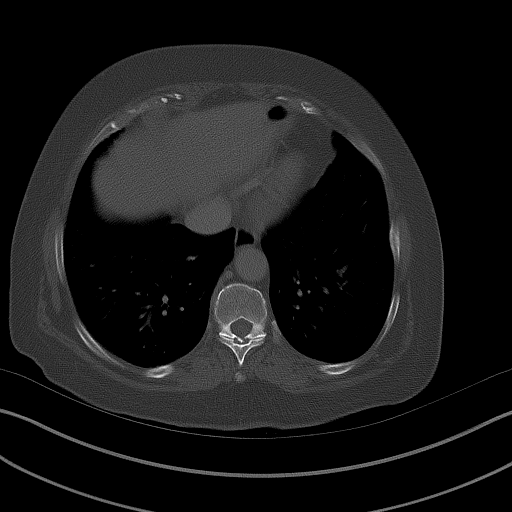

[Series 5: coronal st · coronal · 0.68mm/px · 3 of 97 slices shown]
[im 33/97  soft-tissue]
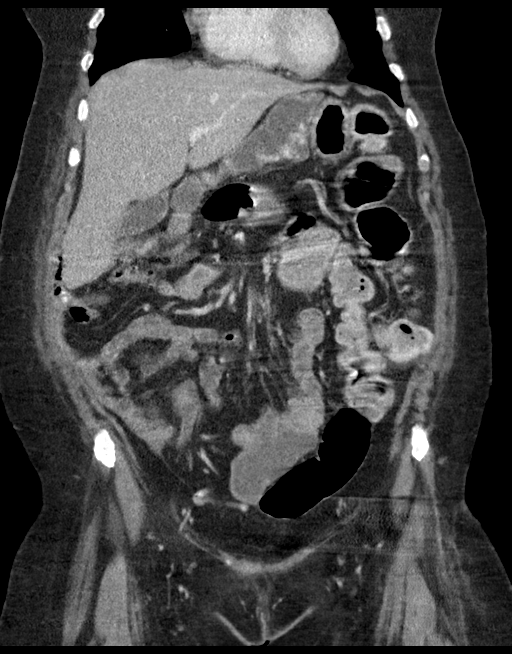
[im 43/97  soft-tissue]
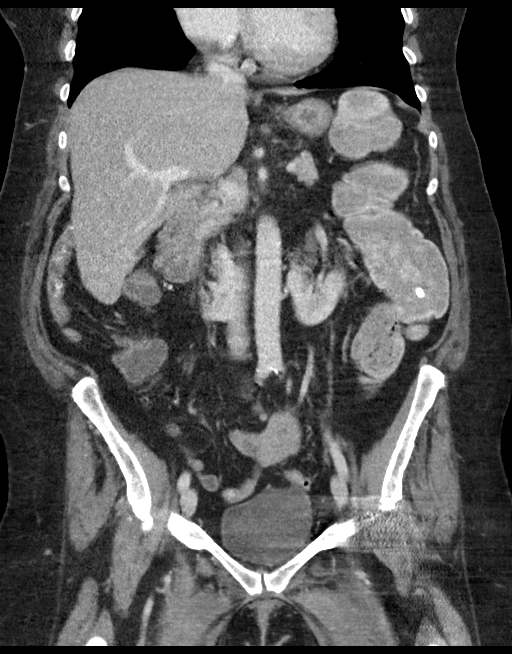
[im 54/97  soft-tissue]
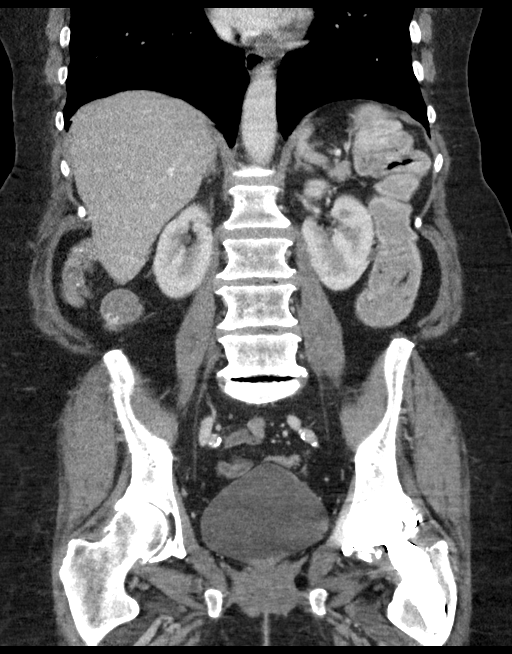

[15 of 46 positions shown; findings below may reference images not displayed]

FINDINGS: Lower chest: Lung bases show no acute findings. Heart size normal.
No pericardial or pleural effusion.

Hepatobiliary: The liver is unremarkable. Gallbladder wall may be
mildly edematous. No biliary ductal dilatation.

Pancreas: Negative.

Spleen: Negative.

Adrenals/Urinary Tract: Adrenal glands are unremarkable. Horseshoe
kidney. Ureters are decompressed. Bladder is grossly unremarkable
although there is a fair amount of streak artifact from a left hip
arthroplasty.

Stomach/Bowel: Stomach, small bowel, appendix and colon are
unremarkable.

Vascular/Lymphatic: Atherosclerotic calcification of the arterial
vasculature without aneurysm. No pathologically enlarged lymph
nodes.

Reproductive: Hysterectomy.  No adnexal mass.

Other: No free fluid. Mesenteries and peritoneum are unremarkable.
Mild laxity of the abdominal wall at the umbilicus.

Musculoskeletal: Left total hip arthroplasty. Degenerative changes
in the spine. Old left rib fractures. Right hip osteoarthritis.
IMPRESSION: 1. No findings to explain the patient's symptoms.
2.  Aortic atherosclerosis (OO1C5-170.0).
